# Patient Record
Sex: Female | Born: 1995 | Race: Black or African American | Hispanic: No | Marital: Single
Health system: Southern US, Community
[De-identification: ages and names within clinical notes are randomized; demographics above are authoritative.]

## PROBLEM LIST (undated history)

## (undated) ENCOUNTER — Inpatient Hospital Stay (HOSPITAL_COMMUNITY): Payer: Self-pay

## (undated) DIAGNOSIS — N200 Calculus of kidney: Secondary | ICD-10-CM

## (undated) DIAGNOSIS — N390 Urinary tract infection, site not specified: Secondary | ICD-10-CM

## (undated) DIAGNOSIS — J45909 Unspecified asthma, uncomplicated: Secondary | ICD-10-CM

## (undated) HISTORY — DX: Unspecified asthma, uncomplicated: J45.909

## (undated) HISTORY — PX: NO PAST SURGERIES: SHX2092

## (undated) HISTORY — DX: Calculus of kidney: N20.0

---
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
## 2014-02-10 ENCOUNTER — Encounter (HOSPITAL_COMMUNITY): Payer: Self-pay | Admitting: Emergency Medicine

## 2014-02-10 ENCOUNTER — Emergency Department (HOSPITAL_COMMUNITY)
Admission: EM | Admit: 2014-02-10 | Discharge: 2014-02-10 | Disposition: A | Discharge status: Home / Self Care | Payer: Medicaid Other | Attending: Emergency Medicine | Admitting: Emergency Medicine

## 2014-02-10 DIAGNOSIS — K068 Other specified disorders of gingiva and edentulous alveolar ridge: Secondary | ICD-10-CM

## 2014-02-10 DIAGNOSIS — K1379 Other lesions of oral mucosa: Secondary | ICD-10-CM

## 2014-02-10 DIAGNOSIS — K1370 Unspecified lesions of oral mucosa: Secondary | ICD-10-CM | POA: Insufficient documentation

## 2014-02-10 DIAGNOSIS — Z87440 Personal history of urinary (tract) infections: Secondary | ICD-10-CM | POA: Insufficient documentation

## 2014-02-10 DIAGNOSIS — Z792 Long term (current) use of antibiotics: Secondary | ICD-10-CM | POA: Diagnosis not present

## 2014-02-10 DIAGNOSIS — O9989 Other specified diseases and conditions complicating pregnancy, childbirth and the puerperium: Secondary | ICD-10-CM | POA: Diagnosis not present

## 2014-02-10 MED ORDER — ACETAMINOPHEN 325 MG PO TABS
650.0000 mg | ORAL_TABLET | Freq: Once | ORAL | Status: AC
  Administered 2014-02-10: 650 mg via ORAL
  Filled 2014-02-10: qty 2

## 2014-02-10 MED ORDER — LIDOCAINE VISCOUS 2 % MT SOLN: 20.0000 mL | Freq: Four times a day (QID) | OROMUCOSAL | Status: DC | PRN

## 2014-02-10 MED ORDER — LIDOCAINE VISCOUS 2 % MT SOLN: 15.0000 mL | Freq: Four times a day (QID) | OROMUCOSAL | Status: DC | PRN

## 2014-02-10 MED ORDER — LIDOCAINE VISCOUS 2 % MT SOLN
15.0000 mL | Freq: Once | OROMUCOSAL | Status: AC
  Administered 2014-02-10: 15 mL via OROMUCOSAL
  Filled 2014-02-10: qty 15

## 2014-02-10 NOTE — ED Notes (Signed)
The pt is c/o a toothache  Today

## 2014-02-10 NOTE — Discharge Instructions (Signed)
Read the information below.  Use the prescribed medication as directed.  Please discuss all new medications with your pharmacist.  You may return to the Emergency Department at any time for worsening condition or any new symptoms that concern you.  Please call the dentist listed above within 48 hours to schedule a close follow up appointment.  If you develop fevers, swelling in your face, difficulty swallowing or breathing, uncontrolled pain, return to the ER immediately for a recheck.     Gingivitis  Gingivitis is an infection of the teeth and bones that support the teeth. Your gums become red, sore, and puffy (swollen). It is caused by germs that build up on your teeth and gums (plaque). HOME CARE  Floss and then brush your teeth.  Brush at least twice a day.  Floss at least once a day.  Avoid sugar between meals.  Do not drink juice before bed. Only drink water.  Make and keep your regular checkups and cleanings with your dentist.  Use any mouth care product or toothpaste as told by your dentist. GET HELP RIGHT AWAY IF:  You have painful, red tissue around your teeth.  You have trouble chewing.  You have loose or infected teeth. MAKE SURE YOU:  Understand these instructions.  Will watch your condition.  Will get help right away if you are not doing well or get worse. Document Released: 06/19/2010 Document Revised: 08/09/2011 Document Reviewed: 08/21/2010 [facility] Patient Information 2015 [place], [place]. This information is not intended to replace advice given to you by your health care provider. Make sure you discuss any questions you have with your health care provider.

## 2014-02-10 NOTE — ED Provider Notes (Signed)
Medical screening examination/treatment/procedure(s) were performed by non-physician practitioner and as supervising physician I was immediately available for consultation/collaboration.   EKG Interpretation None        Dontrez Pettis, MD 02/10/14 2318 

## 2014-02-10 NOTE — ED Notes (Signed)
Pt 4 months preg

## 2014-02-10 NOTE — ED Provider Notes (Signed)
CSN: [id number]     Arrival date & time 02/10/14  2112 History   First MD Initiated Contact with Patient 02/10/14 2119     This chart was scribed for non-physician practitioner, Trixie Dredge PA-C working with Elwin Mocha, MD by Arlan Organ, ED Scribe. This patient was seen in room TR06C/TR06C and the patient's care was started at 9:20 PM.   Chief Complaint  Patient presents with  . Dental Problem   HPI HPI Comments: Karen Curry currently [redacted] weeks pregnant is a 18 y.o. female who presents to the Emergency Department complaining of constant, moderate R sided dental pain onset this evening after waking from sleep. Pain is currently radiating into the R ear and she describes discomfort as sore, states "it feels like my teeth are going to fall out"  The pain has been ongoing approximately one hour . She has not tried any OTC medications to help manage symptoms. She denies any fever, cough, abdominal pain, vaginal discharge, vaginal bleeding, urinary frequency, or dysuria. Pt states she experienced the same discomfort in July of this year. At that time, discomfort lasted for 1 day and resolved without any mediations or interventions. She is not currently followed by an OB/GYN but has a scheduled appointment with the health department on 9/21.  Is taking a daily prenatal vitamin.  Past Medical History  Diagnosis Date  . UTI (urinary tract infection)    No past surgical history on file. No family history on file. History  Substance Use Topics  . Smoking status: Never Smoker   . Smokeless tobacco: Not on file  . Alcohol Use: No   OB History   Grav Para Term Preterm Abortions TAB SAB Ect Mult Living                 Review of Systems  Constitutional: Negative for fever and chills.  HENT: Positive for dental problem. Negative for trouble swallowing.   Gastrointestinal: Negative for nausea, vomiting and abdominal pain.  All other systems reviewed and are negative.     Allergies  Review of  patient's allergies indicates no known allergies.  Home Medications   Prior to Admission medications   Medication Sig Start Date End Date Taking? Authorizing Provider  cephALEXin (KEFLEX) 500 MG capsule Take 1 capsule (500 mg total) by mouth 2 (two) times daily. 09/12/13   Jillyn Ledger, PA-C  metroNIDAZOLE (FLAGYL) 500 MG tablet Take 1 tablet (500 mg total) by mouth 2 (two) times daily. 09/12/13   Jillyn Ledger, PA-C   Triage Vitals: BP 128/76  Pulse 94  Temp(Src) 99.1 F (37.3 C) (Oral)  Resp 24  Wt 194 lb (87.998 kg)  SpO2 99%   Physical Exam  Nursing note and vitals reviewed. Constitutional: She appears well-developed and well-nourished. No distress.  HENT:  Head: Normocephalic and atraumatic.  Right Ear: Tympanic membrane and ear canal normal.  Left Ear: Tympanic membrane and ear canal normal.  Mouth/Throat: Uvula is midline, oropharynx is clear and moist and mucous membranes are normal. No oral lesions. No trismus in the jaw. Normal dentition. No dental abscesses, uvula swelling or dental caries.  Right upper and lower molars are all normal in appearance, no obvious caries, no fractures.  All of these teeth are tender to percussion.  The gingiva is normal in appearance but is tender to palpation throughout the upper and lower right side.  Tongue and oropharynx are normal.  No facial swelling.    Eyes: Conjunctivae are normal.  Neck:  Normal range of motion. Neck supple.  Cardiovascular: Normal rate and regular rhythm.   Pulmonary/Chest: Effort normal and breath sounds normal. No respiratory distress. She has no wheezes. She has no rales.  Abdominal: Soft. She exhibits no distension. There is no tenderness. There is no rebound and no guarding.  Neurological: She is alert.  Skin: She is not diaphoretic.  Psychiatric: She has a normal mood and affect. Her behavior is normal.    ED Course  Procedures (including critical care time)  DIAGNOSTIC STUDIES: Oxygen Saturation is  99% on RA, Normal by my interpretation.    COORDINATION OF CARE: 9:43 PM-Discussed treatment plan with pt at bedside and pt agreed to plan.     Labs Review Labs Reviewed - No data to display  Imaging Review No results found.   EKG Interpretation None       I have discussed with patient the safety of medications in pregnancy, the fact that our knowledge of drug safety in pregnancy is limited and that I can only make recommendations based on what is currently known at this time and the safety ratings given to medications.  We discussed that medications may pass through the placenta and have encouraged them to make their own decisions regarding the medications used in light of this information.    MDM   Final diagnoses:  Pain of gingiva  Oral pain    Pregnant patient with right upper and lower gingival pain, "dental pain" but dentition is normal in appearance.  What she actually describes is feeling as if the teeth with "fall out"  - I suspect this is a pregnancy-related gingival sensitivity.  The pain has only been present one hour.  It is present diffusely including all of her upper and lower teeth on the right side.  I doubt there is an injury, caries, or dental abscess affecting all of these teeth occuring all at one time.  Pt given tylenol and viscous lidocaine (category B) in ED, d/c home with viscous lidocaine instructions to swish and spit, dental follow up.  Pt also has OB f/u this month.  Has not yet had prenatal care but is taking a daily prenatal vitamin and is having no concerning symptoms including abdominal pain, vaginal bleeding, or discharge.  Discussed findings, treatment, and follow up  with patient.  Pt given return precautions.  Pt verbalizes understanding and agrees with plan.      I personally performed the services described in this documentation, which was scribed in my presence. The recorded information has been reviewed and is accurate.    Trixie Dredge,  PA-C 02/10/14 2201

## 2014-02-21 ENCOUNTER — Other Ambulatory Visit (HOSPITAL_COMMUNITY): Payer: Self-pay | Admitting: Nurse Practitioner

## 2014-02-21 DIAGNOSIS — Z3689 Encounter for other specified antenatal screening: Secondary | ICD-10-CM

## 2014-02-21 LAB — OB RESULTS CONSOLE HIV ANTIBODY (ROUTINE TESTING): HIV: NONREACTIVE

## 2014-02-21 LAB — OB RESULTS CONSOLE GC/CHLAMYDIA
Chlamydia: NEGATIVE
Gonorrhea: NEGATIVE

## 2014-02-21 LAB — OB RESULTS CONSOLE HEPATITIS B SURFACE ANTIGEN: Hepatitis B Surface Ag: NEGATIVE

## 2014-02-21 LAB — OB RESULTS CONSOLE RUBELLA ANTIBODY, IGM: Rubella: IMMUNE

## 2014-02-21 LAB — OB RESULTS CONSOLE ABO/RH: RH Type: POSITIVE

## 2014-02-21 LAB — OB RESULTS CONSOLE ANTIBODY SCREEN: Antibody Screen: NEGATIVE

## 2014-02-21 LAB — OB RESULTS CONSOLE RPR: RPR: NONREACTIVE

## 2014-03-07 ENCOUNTER — Encounter (HOSPITAL_COMMUNITY): Payer: Self-pay

## 2014-03-07 ENCOUNTER — Ambulatory Visit (HOSPITAL_COMMUNITY)
Admission: RE | Admit: 2014-03-07 | Discharge: 2014-03-07 | Disposition: A | Discharge status: Home / Self Care | Payer: Medicaid Other | Source: Ambulatory Visit | Attending: Nurse Practitioner | Admitting: Nurse Practitioner

## 2014-03-07 DIAGNOSIS — Z3A19 19 weeks gestation of pregnancy: Secondary | ICD-10-CM | POA: Diagnosis not present

## 2014-03-07 DIAGNOSIS — Z3689 Encounter for other specified antenatal screening: Secondary | ICD-10-CM

## 2014-03-07 DIAGNOSIS — Z36 Encounter for antenatal screening of mother: Secondary | ICD-10-CM | POA: Insufficient documentation

## 2014-03-28 ENCOUNTER — Inpatient Hospital Stay (HOSPITAL_COMMUNITY)
Admission: AD | Admit: 2014-03-28 | Discharge: 2014-03-28 | Disposition: A | Discharge status: Home / Self Care | Payer: Medicaid Other | Source: Ambulatory Visit | Attending: Obstetrics & Gynecology | Admitting: Obstetrics & Gynecology

## 2014-03-28 ENCOUNTER — Encounter (HOSPITAL_COMMUNITY): Payer: Self-pay | Admitting: *Deleted

## 2014-03-28 DIAGNOSIS — R102 Pelvic and perineal pain: Secondary | ICD-10-CM

## 2014-03-28 DIAGNOSIS — R109 Unspecified abdominal pain: Secondary | ICD-10-CM | POA: Insufficient documentation

## 2014-03-28 DIAGNOSIS — R1110 Vomiting, unspecified: Secondary | ICD-10-CM | POA: Insufficient documentation

## 2014-03-28 DIAGNOSIS — O26892 Other specified pregnancy related conditions, second trimester: Secondary | ICD-10-CM

## 2014-03-28 DIAGNOSIS — M545 Low back pain: Secondary | ICD-10-CM | POA: Diagnosis not present

## 2014-03-28 DIAGNOSIS — Z3A22 22 weeks gestation of pregnancy: Secondary | ICD-10-CM

## 2014-03-28 LAB — URINALYSIS, ROUTINE W REFLEX MICROSCOPIC
BILIRUBIN URINE: NEGATIVE
Glucose, UA: NEGATIVE mg/dL
Hgb urine dipstick: NEGATIVE
KETONES UR: 15 mg/dL — AB
NITRITE: NEGATIVE
PROTEIN: NEGATIVE mg/dL
Specific Gravity, Urine: 1.02 (ref 1.005–1.030)
UROBILINOGEN UA: 0.2 mg/dL (ref 0.0–1.0)
pH: 7 (ref 5.0–8.0)

## 2014-03-28 LAB — WET PREP, GENITAL
Clue Cells Wet Prep HPF POC: NONE SEEN
Trich, Wet Prep: NONE SEEN
Yeast Wet Prep HPF POC: NONE SEEN

## 2014-03-28 LAB — URINE MICROSCOPIC-ADD ON

## 2014-03-28 MED ORDER — CYCLOBENZAPRINE HCL 10 MG PO TABS
10.0000 mg | ORAL_TABLET | Freq: Three times a day (TID) | ORAL | Status: DC | PRN
Start: 2014-03-28 — End: 2015-06-16

## 2014-03-28 MED ORDER — TRAMADOL HCL 50 MG PO TABS: 50.0000 mg | ORAL_TABLET | Freq: Once | ORAL | Status: DC

## 2014-03-28 MED ORDER — IBUPROFEN 600 MG PO TABS: 600.0000 mg | ORAL_TABLET | Freq: Four times a day (QID) | ORAL | Status: DC | PRN

## 2014-03-28 NOTE — Discharge Instructions (Signed)

## 2014-03-28 NOTE — MAU Note (Signed)
Pt reports intermittent lower left abdominal pain and one occurrence of emesis.  States she had a lot of white discharge yesterday.  Denies VB/LOF.

## 2014-03-28 NOTE — MAU Provider Note (Signed)
History     CSN: [id number]  Arrival date and time: 03/28/14 [id number]   First Provider Initiated Contact with Patient 03/28/14 1910      Chief Complaint  Patient presents with  . Abdominal Pain  . Emesis   Abdominal Pain Associated symptoms include vomiting.  Emesis  Associated symptoms include abdominal pain.   Karen Curry is a 18 y.o. G1P0 at 7654w4d who presents with sharp, intermittent, severe left lower back pain that radiates into her left groin for the past day. Nothing makes the pain better or worse. Has not tried any medications. Pain is somewhat improved currently.   Additionally reports increased thick, white, non-foul smelling vaginal discharge for the past day. One episode of emesis en route to the MAU via EMS. No dysuria. No diarrhea. No constipation. Has been passing gas. No recent sick contacts.   Reports prior history of kidney stones and "kidney infections." Says that this pain is not similar.   No LOF or vaginal bleeding. No contractions. Patient is unsure if she has felt her baby move yet.    OB History   Grav Para Term Preterm Abortions TAB SAB Ect Mult Living   1               Past Medical History  Diagnosis Date  . UTI (urinary tract infection)     History reviewed. No pertinent past surgical history.  History reviewed. No pertinent family history.  History  Substance Use Topics  . Smoking status: Never Smoker   . Smokeless tobacco: Not on file  . Alcohol Use: No    Allergies: No Known Allergies  Prescriptions prior to admission  Medication Sig Dispense Refill  . acetaminophen (TYLENOL) 325 MG tablet Take 650 mg by mouth every 6 (six) hours as needed for moderate pain (Used for tooth problems.).      Marland Kitchen. lidocaine (XYLOCAINE) 2 % solution Use as directed 15 mLs in the mouth or throat every 6 (six) hours as needed for mouth pain. Swish and SPIT PRN mouth pain  150 mL  0    Review of Systems  Gastrointestinal: Positive for vomiting and  abdominal pain.  All other systems reviewed and are negative.  Physical Exam   Blood pressure 127/75, pulse 92, temperature 99.1 F (37.3 C), temperature source Oral, resp. rate 18, last menstrual period 08/30/2013.  Physical Exam  Nursing note and vitals reviewed. Constitutional: She is oriented to person, place, and time. She appears well-developed and well-nourished. No distress.  HENT:  Head: Normocephalic and atraumatic.  Eyes: EOM are normal.  Neck: Normal range of motion.  Cardiovascular: Normal rate, regular rhythm and normal heart sounds.   No murmur heard. Respiratory: Effort normal and breath sounds normal. No respiratory distress. She has no wheezes.  GI: Soft. She exhibits no distension. There is tenderness (Mildly tender to palpation in left inguinal fold).  Musculoskeletal: Normal range of motion. She exhibits tenderness (Mild left sided CVA tenderness.).       Lumbar back: She exhibits no bony tenderness, no swelling and no deformity.  Neurological: She is alert and oriented to person, place, and time. She has normal reflexes.  Skin: Skin is warm and dry.  SSE: Moderate amount of thick, white discharge noted in vaginal vault SVE: Cervix closed/thick/high, no CMT FHT:  FHR: 140 bpm, variability: avg,  accelerations:  +,  decelerations:  none  Procedures-None   Results for orders placed during the hospital encounter of 03/28/14 (from the past 24 hour(s))  URINALYSIS, ROUTINE W REFLEX MICROSCOPIC     Status: Abnormal   Collection Time    03/28/14  6:30 PM      Result Value Ref Range   Color, Urine YELLOW  YELLOW   APPearance CLEAR  CLEAR   Specific Gravity, Urine 1.020  1.005 - 1.030   pH 7.0  5.0 - 8.0   Glucose, UA NEGATIVE  NEGATIVE mg/dL   Hgb urine dipstick NEGATIVE  NEGATIVE   Bilirubin Urine NEGATIVE  NEGATIVE   Ketones, ur 15 (*) NEGATIVE mg/dL   Protein, ur NEGATIVE  NEGATIVE mg/dL   Urobilinogen, UA 0.2  0.0 - 1.0 mg/dL   Nitrite NEGATIVE  NEGATIVE    Leukocytes, UA SMALL (*) NEGATIVE  URINE MICROSCOPIC-ADD ON     Status: Abnormal   Collection Time    03/28/14  6:30 PM      Result Value Ref Range   Squamous Epithelial / LPF FEW (*) RARE   WBC, UA 3-6  <3 WBC/hpf   RBC / HPF 0-2  <3 RBC/hpf   Bacteria, UA MANY (*) RARE   Crystals CA OXALATE CRYSTALS (*) NEGATIVE   Urine-Other AMORPHOUS URATES/PHOSPHATES    WET PREP, GENITAL     Status: Abnormal   Collection Time    03/28/14  7:30 PM      Result Value Ref Range   Yeast Wet Prep HPF POC NONE SEEN  NONE SEEN   Trich, Wet Prep NONE SEEN  NONE SEEN   Clue Cells Wet Prep HPF POC NONE SEEN  NONE SEEN   WBC, Wet Prep HPF POC FEW (*) NONE SEEN      Assessment and Plan  A: 433w4d IUP. MOst likely musculoskeletal pain.  No evidence of infection/PTL/kidney stone. GIven rx for flexeril and ibuprofen to take this week prn  P: Discharge home in stable condition. Labor precautions and kickcounts reviewed.   Curry,Karen Porchia 03/28/2014, 8:37 PM

## 2014-03-28 NOTE — MAU Note (Signed)
Urine in lab 

## 2014-03-29 LAB — GC/CHLAMYDIA PROBE AMP
CT Probe RNA: NEGATIVE
GC PROBE AMP APTIMA: NEGATIVE

## 2014-03-29 NOTE — MAU Provider Note (Signed)
Attestation of Attending Supervision of Advanced Practitioner (CNM/NP): Evaluation and management procedures were performed by the Advanced Practitioner under my supervision and collaboration.  I have reviewed the Advanced Practitioner's note and chart, and I agree with the management and plan.  HARRAWAY-SMITH, Laporsche Hoeger 9:12 AM     

## 2014-04-01 ENCOUNTER — Encounter (HOSPITAL_COMMUNITY): Payer: Self-pay | Admitting: *Deleted

## 2014-04-25 ENCOUNTER — Inpatient Hospital Stay (HOSPITAL_COMMUNITY)
Admission: AD | Admit: 2014-04-25 | Discharge: 2014-04-25 | Disposition: A | Discharge status: Home / Self Care | Payer: Self-pay | Source: Ambulatory Visit | Attending: Obstetrics & Gynecology | Admitting: Obstetrics & Gynecology

## 2014-04-25 ENCOUNTER — Encounter (HOSPITAL_COMMUNITY): Payer: Self-pay | Admitting: *Deleted

## 2014-04-25 DIAGNOSIS — Z3A26 26 weeks gestation of pregnancy: Secondary | ICD-10-CM | POA: Insufficient documentation

## 2014-04-25 DIAGNOSIS — O9989 Other specified diseases and conditions complicating pregnancy, childbirth and the puerperium: Secondary | ICD-10-CM

## 2014-04-25 DIAGNOSIS — R102 Pelvic and perineal pain: Secondary | ICD-10-CM | POA: Insufficient documentation

## 2014-04-25 DIAGNOSIS — O26892 Other specified pregnancy related conditions, second trimester: Secondary | ICD-10-CM | POA: Insufficient documentation

## 2014-04-25 DIAGNOSIS — O26899 Other specified pregnancy related conditions, unspecified trimester: Secondary | ICD-10-CM

## 2014-04-25 DIAGNOSIS — R109 Unspecified abdominal pain: Secondary | ICD-10-CM | POA: Insufficient documentation

## 2014-04-25 LAB — URINALYSIS, ROUTINE W REFLEX MICROSCOPIC
BILIRUBIN URINE: NEGATIVE
GLUCOSE, UA: NEGATIVE mg/dL
Hgb urine dipstick: NEGATIVE
KETONES UR: NEGATIVE mg/dL
Leukocytes, UA: NEGATIVE
Nitrite: NEGATIVE
PROTEIN: 100 mg/dL — AB
Specific Gravity, Urine: 1.025 (ref 1.005–1.030)
UROBILINOGEN UA: 0.2 mg/dL (ref 0.0–1.0)
pH: 6 (ref 5.0–8.0)

## 2014-04-25 LAB — URINE MICROSCOPIC-ADD ON

## 2014-04-25 LAB — FETAL FIBRONECTIN: FETAL FIBRONECTIN: NEGATIVE

## 2014-04-25 NOTE — Discharge Instructions (Signed)

## 2014-04-25 NOTE — MAU Provider Note (Signed)
First Provider Initiated Contact with Patient 04/25/14 2223      Chief Complaint:  Abdominal Pain   Karen Curry is  18 y.o. G1P0 at 4981w4d presents complaining of Abdominal Pain It is in the suprapubic area, also in LLQ; it comes and goes and lasts about 15 seconds.   Also c/o lower back pain, consistent with the musculoskeletal pain she has been having for over a month.  Has a rx for flexeril, which helps, just hasn't taken any today. Admits to walking around a lot today, including climbing stairs a lot.  No bleeding/dysuria/vaginal irritation/itching   Of note, upon first entering the room, pt had 4 visitors with her.  She was uncommunicative with her eyes closed, moaning, refusing to open her eyes and answer questions.  When I cleared the room of visitors, pt's demeanor changed completely: smiling and laughing, appropriate interaction.  Obstetrical/Gynecological History: OB History    Gravida Para Term Preterm AB TAB SAB Ectopic Multiple Living   1              Past Medical History: Past Medical History  Diagnosis Date  . UTI (urinary tract infection)     Past Surgical History: Past Surgical History  Procedure Laterality Date  . No past surgeries      Family History: Family History  Problem Relation Age of Onset  . Hypertension Father     Social History: History  Substance Use Topics  . Smoking status: Never Smoker   . Smokeless tobacco: Not on file  . Alcohol Use: No    Allergies: No Known Allergies  Meds:  Prescriptions prior to admission  Medication Sig Dispense Refill Last Dose  . acetaminophen (TYLENOL) 325 MG tablet Take 650 mg by mouth every 6 (six) hours as needed for moderate pain (Used for tooth problems.).   Past Week at Unknown time  . cyclobenzaprine (FLEXERIL) 10 MG tablet Take 1 tablet (10 mg total) by mouth every 8 (eight) hours as needed for muscle spasms. 30 tablet 1   . ibuprofen (ADVIL,MOTRIN) 600 MG tablet Take 1 tablet (600 mg total) by  mouth every 6 (six) hours as needed. 20 tablet 0     Review of Systems   Constitutional: Negative for fever and chills Eyes: Negative for visual disturbances Respiratory: Negative for shortness of breath, dyspnea Cardiovascular: Negative for chest pain or palpitations  Gastrointestinal: Negative for vomiting, diarrhea and constipation.  Had normal BM yesterday Genitourinary: Negative for dysuria and urgency Musculoskeletal: Negative for joint pain, myalgias  Neurological: Negative for dizziness and headaches     Physical Exam  Last menstrual period 08/30/2013.  123/70 GENERAL: Well-developed, well-nourished female in no acute distress.  LUNGS: Clear to auscultation bilaterally.  HEART: Regular rate and rhythm. ABDOMEN: Soft, nontender, nondistended, gravid. No guarding or rebound tenderness.  Soft when she has a pain EXTREMITIES: Nontender, no edema, 2+ distal pulses. DTR's 2+ CERVICAL EXAM: Dilatation 0cm   Effacement 0%   Station out of pelvis.  Fetal fibronectin collected. FHT:  Baseline rate 140 bpm   Variability moderate  Accelerations present (10X10)   Decelerations none Contractions: Every 0 mins   Labs: Results for orders placed or performed during the hospital encounter of 04/25/14 (from the past 24 hour(s))  Fetal fibronectin   Collection Time: 04/25/14 10:15 PM  Result Value Ref Range   Fetal Fibronectin NEGATIVE NEGATIVE  Urinalysis, Routine w reflex microscopic   Collection Time: 04/25/14 10:30 PM  Result Value Ref Range   Color,  Urine YELLOW YELLOW   APPearance CLEAR CLEAR   Specific Gravity, Urine 1.025 1.005 - 1.030   pH 6.0 5.0 - 8.0   Glucose, UA NEGATIVE NEGATIVE mg/dL   Hgb urine dipstick NEGATIVE NEGATIVE   Bilirubin Urine NEGATIVE NEGATIVE   Ketones, ur NEGATIVE NEGATIVE mg/dL   Protein, ur 161100 (A) NEGATIVE mg/dL   Urobilinogen, UA 0.2 0.0 - 1.0 mg/dL   Nitrite NEGATIVE NEGATIVE   Leukocytes, UA NEGATIVE NEGATIVE  Urine microscopic-add on    Collection Time: 04/25/14 10:30 PM  Result Value Ref Range   Squamous Epithelial / LPF FEW (A) RARE   WBC, UA 0-2 <3 WBC/hpf   RBC / HPF 0-2 <3 RBC/hpf   Bacteria, UA RARE RARE   Urine-Other MUCOUS PRESENT    Imaging Studies:  No results found.  Assessment: Karen Curry is  18 y.o. G1P0 at 4152w4d presents with musculoskeletal back pain, round ligament pain. Possible intestinal wall spasm. Pain is less often now and not as intense.    Plan: Culture urine (+ protein) Continue Flexeril prn F/U if new sx develop  CRESENZO-DISHMAN,Milanya Sunderland 11/26/201511:47 PM  '

## 2014-04-25 NOTE — MAU Note (Signed)
C/o lower back since yesterday afternoon; c/o lower abdominal pain that started about a hour ago; more pain on LLQ than LRQ;

## 2014-04-26 LAB — URINE CULTURE: Special Requests: NORMAL

## 2014-05-31 NOTE — L&D Delivery Note (Signed)
Delivery Note At 8:55 PM a viable and healthy female was delivered via Vaginal, Spontaneous Delivery (Presentation: Left Occiput Anterior).  APGAR: 8, 9; weight 5 lb 8.2 oz (2500 g).   Placenta status: Intact, Spontaneous.  Cord: 3 vessels with the following complications: None.    Anesthesia: Epidural Local  Episiotomy: None Lacerations: 2nd degree;Perineal Suture Repair: 3.0 monocryl Est. Blood Loss (mL): 250  Mom to postpartum.  Baby to Couplet care / Skin to Skin.  Karen Curry is a 19 y.o. female G1P0 with IUP at 8760w1d by 8week US presenting for active labor after cervical change noted in MAU. She progressed with some augmentation with Pitocin to complete and pushed less than 1 hour to deliver.  Cord clamping delayed by several minutes then clamped by CNM and cut by family member.  Placenta intact and spontaneous, bleeding minimal.  Very small second degree perineal laceration repaired without difficulty.  Mom and baby stable prior to transfer to postpartum. She plans on breast and bottle feeding. She request Depo Provera for birth control.  LEFTWICH-KIRBY, Caz Weaver 08/05/2014, 10:38 PM

## 2014-07-02 LAB — OB RESULTS CONSOLE GC/CHLAMYDIA
Chlamydia: NEGATIVE
Gonorrhea: NEGATIVE

## 2014-07-02 LAB — OB RESULTS CONSOLE GBS: STREP GROUP B AG: POSITIVE

## 2014-07-30 ENCOUNTER — Other Ambulatory Visit (HOSPITAL_COMMUNITY): Payer: Self-pay | Admitting: Nurse Practitioner

## 2014-07-30 DIAGNOSIS — O480 Post-term pregnancy: Secondary | ICD-10-CM

## 2014-08-02 ENCOUNTER — Telehealth (HOSPITAL_COMMUNITY): Payer: Self-pay | Admitting: *Deleted

## 2014-08-02 ENCOUNTER — Encounter (HOSPITAL_COMMUNITY): Payer: Self-pay | Admitting: *Deleted

## 2014-08-02 ENCOUNTER — Ambulatory Visit (HOSPITAL_COMMUNITY)
Admission: RE | Admit: 2014-08-02 | Discharge: 2014-08-02 | Disposition: A | Discharge status: Home / Self Care | Payer: Medicaid Other | Source: Ambulatory Visit | Attending: Nurse Practitioner | Admitting: Nurse Practitioner

## 2014-08-02 DIAGNOSIS — Z3A40 40 weeks gestation of pregnancy: Secondary | ICD-10-CM | POA: Insufficient documentation

## 2014-08-02 DIAGNOSIS — O480 Post-term pregnancy: Secondary | ICD-10-CM | POA: Diagnosis not present

## 2014-08-02 NOTE — Telephone Encounter (Signed)
Preadmission screen  

## 2014-08-03 ENCOUNTER — Inpatient Hospital Stay (HOSPITAL_COMMUNITY)
Admission: AD | Admit: 2014-08-03 | Discharge: 2014-08-03 | Disposition: A | Discharge status: Home / Self Care | Payer: Medicaid Other | Source: Ambulatory Visit | Attending: Family Medicine | Admitting: Family Medicine

## 2014-08-03 ENCOUNTER — Encounter (HOSPITAL_COMMUNITY): Payer: Self-pay | Admitting: *Deleted

## 2014-08-03 DIAGNOSIS — O9989 Other specified diseases and conditions complicating pregnancy, childbirth and the puerperium: Secondary | ICD-10-CM | POA: Insufficient documentation

## 2014-08-03 DIAGNOSIS — O471 False labor at or after 37 completed weeks of gestation: Secondary | ICD-10-CM

## 2014-08-03 DIAGNOSIS — Z3A40 40 weeks gestation of pregnancy: Secondary | ICD-10-CM | POA: Insufficient documentation

## 2014-08-03 LAB — AMNISURE RUPTURE OF MEMBRANE (ROM) NOT AT ARMC: Amnisure ROM: NEGATIVE

## 2014-08-03 MED ORDER — NALBUPHINE HCL 10 MG/ML IJ SOLN: 10.0000 mg | Freq: Once | INTRAMUSCULAR | Status: DC

## 2014-08-03 MED ORDER — NALBUPHINE HCL 10 MG/ML IJ SOLN
10.0000 mg | Freq: Once | INTRAMUSCULAR | Status: AC
  Administered 2014-08-03: 10 mg via INTRAMUSCULAR
  Filled 2014-08-03: qty 1

## 2014-08-03 MED ORDER — PROMETHAZINE HCL 25 MG/ML IJ SOLN
12.5000 mg | Freq: Four times a day (QID) | INTRAMUSCULAR | Status: DC | PRN
Start: 2014-08-03 — End: 2014-08-03

## 2014-08-03 NOTE — MAU Provider Note (Signed)
History     CSN: [id number]  Arrival date and time: 08/03/14 1551   None     No chief complaint on file.  HPI  Patient is 19 y.o. G1P0 544w6d, health dept patient, here with complaints of water breaking.  She reports loss of mucous with pink streaking she believed to be mucous plug at 0700 this AM, with sensation of water breaking (with no blood in fluid).  She felt contractions worsening since that time.  She was sent over via EMS by health department.  Reports sensation of contractions wrapping around abdomen lasting for minutes, occuring every 3 minutes and that they are extremely painful.  She is talking comfortably with family members on phone while expressing about pain. She reports increased fetal activity. No problems with blood pressure or DM during the pregnancy.    Past Medical History  Diagnosis Date  . UTI (urinary tract infection)   . Asthma     Past Surgical History  Procedure Laterality Date  . No past surgeries      Family History  Problem Relation Age of Onset  . Hypertension Father     History  Substance Use Topics  . Smoking status: Never Smoker   . Smokeless tobacco: Not on file  . Alcohol Use: No    Allergies: No Known Allergies  Prescriptions prior to admission  Medication Sig Dispense Refill Last Dose  . acetaminophen (TYLENOL) 325 MG tablet Take 650 mg by mouth every 6 (six) hours as needed for moderate pain (Used for tooth problems.).   Past Month at Unknown time  . Prenatal Vit-Fe Fumarate-FA (PRENATAL MULTIVITAMIN) TABS tablet Take 1 tablet by mouth daily at 12 noon.   Past Week at Unknown time  . cyclobenzaprine (FLEXERIL) 10 MG tablet Take 1 tablet (10 mg total) by mouth every 8 (eight) hours as needed for muscle spasms. (Patient not taking: Reported on 08/03/2014) 30 tablet 1   . ibuprofen (ADVIL,MOTRIN) 600 MG tablet Take 1 tablet (600 mg total) by mouth every 6 (six) hours as needed. (Patient not taking: Reported on 08/03/2014) 20 tablet 0      Review of Systems  Constitutional: Negative for fever and chills.  Eyes: Negative for blurred vision.  Respiratory: Negative for cough and shortness of breath.   Cardiovascular: Negative for leg swelling.  Gastrointestinal: Positive for abdominal pain. Negative for nausea, vomiting and diarrhea.  Genitourinary: Negative for dysuria and hematuria.  Neurological: Negative for headaches.   Physical Exam   Blood pressure 148/88, pulse 81, temperature 98 F (36.7 C), temperature source Oral, resp. rate 20, last menstrual period 08/30/2013.  Physical Exam  Constitutional: She appears well-developed and well-nourished. She appears distressed.  HENT:  Head: Normocephalic and atraumatic.  Eyes: Conjunctivae are normal. Pupils are equal, round, and reactive to light.  Neck: Normal range of motion.  Cardiovascular: Normal rate.   Respiratory: Effort normal. No respiratory distress.  GI: Soft. She exhibits no distension. There is no tenderness.  Musculoskeletal: Normal range of motion. She exhibits no edema.  Neurological: She is alert. Coordination normal.  Skin: Skin is warm and dry. She is not diaphoretic.  Cervical exam 1/50/posterior/firm.  On repeat exam, found to be 1/50/posterior/medium  MAU Course  Procedures  MDM Ultrasound-confirmed vertex presentation  Assessment and Plan  A: Patient is 19 y.o. G1P0 394w6d here with complaints of water breaking   P: Cervical exam reassuring; no visible pooling     Ferning neg by microscopy, UA neg  Amnisure neg      Recevied nubain and phenergan prior to dishcarge      Discussed early labor; labor precautions with patient           Curry,Karen 08/03/2014, 5:12 PM   OB fellow attestation:  I have seen and examined this patient; I agree with above documentation in the resident's note.   Karen Curry is a 19 y.o. G1P0 reporting loss of fluid, regular contractions, very uncomfortable brought in by EMS. +FM, denies VB, vaginal  discharge.  PE: BP 116/62 mmHg  Pulse 66  Temp(Src) 98 F (36.7 C) (Oral)  Resp 18  LMP 08/30/2013 Gen: very uncomfortable with contractions Resp: normal effort, no distress Abd: gravid Dilation: 1 Effacement (%): 50 Cervical Position: Posterior Station: -3 Presentation: Vertex Exam by:: Karen Reamer, RN   ROS, labs, PMH reviewed NST reactive  Plan: - fetal kick counts reinforced,  labor precautions - continue routine follow up in OB clinic - given  nubain IM,  nubain IV, 12.5mg  phenergan for pain/therapeutic rest after minimal cervical change in MAU - fern neg, amnisure neg => d/c home  Karen Gage ROCIO, MD 2:28 AM

## 2014-08-03 NOTE — MAU Note (Signed)
Mucus plug came out at 0700 and stated she felt like her water may have broken 1500.  Contractions have gotten worse since.  Denies VB.

## 2014-08-04 ENCOUNTER — Encounter (HOSPITAL_COMMUNITY): Payer: Self-pay | Admitting: *Deleted

## 2014-08-04 ENCOUNTER — Inpatient Hospital Stay (HOSPITAL_COMMUNITY)
Admission: AD | Admit: 2014-08-04 | Discharge: 2014-08-07 | DRG: 775 | Disposition: A | Discharge status: Home / Self Care | Payer: Medicaid Other | Source: Ambulatory Visit | Attending: Obstetrics & Gynecology | Admitting: Obstetrics & Gynecology

## 2014-08-04 DIAGNOSIS — Z3A41 41 weeks gestation of pregnancy: Secondary | ICD-10-CM | POA: Diagnosis present

## 2014-08-04 DIAGNOSIS — J45909 Unspecified asthma, uncomplicated: Secondary | ICD-10-CM | POA: Diagnosis present

## 2014-08-04 DIAGNOSIS — O99824 Streptococcus B carrier state complicating childbirth: Secondary | ICD-10-CM | POA: Diagnosis present

## 2014-08-04 DIAGNOSIS — IMO0001 Reserved for inherently not codable concepts without codable children: Secondary | ICD-10-CM

## 2014-08-04 DIAGNOSIS — Z23 Encounter for immunization: Secondary | ICD-10-CM

## 2014-08-04 DIAGNOSIS — O9952 Diseases of the respiratory system complicating childbirth: Secondary | ICD-10-CM | POA: Diagnosis present

## 2014-08-04 DIAGNOSIS — O480 Post-term pregnancy: Secondary | ICD-10-CM | POA: Diagnosis present

## 2014-08-05 ENCOUNTER — Inpatient Hospital Stay (HOSPITAL_COMMUNITY): Payer: Medicaid Other | Admitting: Anesthesiology

## 2014-08-05 ENCOUNTER — Encounter (HOSPITAL_COMMUNITY): Payer: Self-pay | Admitting: General Practice

## 2014-08-05 DIAGNOSIS — O99824 Streptococcus B carrier state complicating childbirth: Secondary | ICD-10-CM

## 2014-08-05 DIAGNOSIS — Z23 Encounter for immunization: Secondary | ICD-10-CM | POA: Diagnosis not present

## 2014-08-05 DIAGNOSIS — IMO0001 Reserved for inherently not codable concepts without codable children: Secondary | ICD-10-CM

## 2014-08-05 DIAGNOSIS — J45909 Unspecified asthma, uncomplicated: Secondary | ICD-10-CM | POA: Diagnosis present

## 2014-08-05 DIAGNOSIS — Z3A41 41 weeks gestation of pregnancy: Secondary | ICD-10-CM | POA: Diagnosis present

## 2014-08-05 DIAGNOSIS — O480 Post-term pregnancy: Secondary | ICD-10-CM | POA: Diagnosis present

## 2014-08-05 DIAGNOSIS — O9952 Diseases of the respiratory system complicating childbirth: Secondary | ICD-10-CM | POA: Diagnosis present

## 2014-08-05 LAB — TYPE AND SCREEN
ABO/RH(D): AB POS
Antibody Screen: NEGATIVE

## 2014-08-05 LAB — RPR: RPR Ser Ql: NONREACTIVE

## 2014-08-05 LAB — CBC
HEMATOCRIT: 37.7 % (ref 36.0–46.0)
HEMOGLOBIN: 12.7 g/dL (ref 12.0–15.0)
MCH: 27.7 pg (ref 26.0–34.0)
MCHC: 33.7 g/dL (ref 30.0–36.0)
MCV: 82.1 fL (ref 78.0–100.0)
Platelets: 244 10*3/uL (ref 150–400)
RBC: 4.59 MIL/uL (ref 3.87–5.11)
RDW: 14.6 % (ref 11.5–15.5)
WBC: 7.9 10*3/uL (ref 4.0–10.5)

## 2014-08-05 LAB — ABO/RH: ABO/RH(D): AB POS

## 2014-08-05 MED ORDER — DIPHENHYDRAMINE HCL 50 MG/ML IJ SOLN: 12.5000 mg | INTRAMUSCULAR | Status: DC | PRN

## 2014-08-05 MED ORDER — OXYTOCIN 40 UNITS IN LACTATED RINGERS INFUSION - SIMPLE MED: 62.5000 mL/h | INTRAVENOUS | Status: DC

## 2014-08-05 MED ORDER — LACTATED RINGERS IV SOLN: 500.0000 mL | INTRAVENOUS | Status: DC | PRN

## 2014-08-05 MED ORDER — LIDOCAINE HCL (PF) 1 % IJ SOLN
INTRAMUSCULAR | Status: DC | PRN
  Administered 2014-08-05 (×2): 4 mL

## 2014-08-05 MED ORDER — FENTANYL 2.5 MCG/ML BUPIVACAINE 1/10 % EPIDURAL INFUSION (WH - ANES)
INTRAMUSCULAR | Status: DC | PRN
  Administered 2014-08-05: 14 mL/h via EPIDURAL

## 2014-08-05 MED ORDER — CITRIC ACID-SODIUM CITRATE 334-500 MG/5ML PO SOLN: 30.0000 mL | ORAL | Status: DC | PRN

## 2014-08-05 MED ORDER — LACTATED RINGERS IV SOLN
INTRAVENOUS | Status: DC
  Administered 2014-08-05 (×2): via INTRAUTERINE

## 2014-08-05 MED ORDER — OXYTOCIN BOLUS FROM INFUSION: 500.0000 mL | INTRAVENOUS | Status: DC

## 2014-08-05 MED ORDER — PHENYLEPHRINE 40 MCG/ML (10ML) SYRINGE FOR IV PUSH (FOR BLOOD PRESSURE SUPPORT)
80.0000 ug | PREFILLED_SYRINGE | INTRAVENOUS | Status: DC | PRN
  Filled 2014-08-05: qty 2

## 2014-08-05 MED ORDER — EPHEDRINE 5 MG/ML INJ
10.0000 mg | INTRAVENOUS | Status: DC | PRN
  Filled 2014-08-05: qty 2

## 2014-08-05 MED ORDER — LIDOCAINE HCL (PF) 1 % IJ SOLN
30.0000 mL | INTRAMUSCULAR | Status: DC | PRN
  Administered 2014-08-05: 30 mL via SUBCUTANEOUS
  Filled 2014-08-05: qty 30

## 2014-08-05 MED ORDER — ACETAMINOPHEN 325 MG PO TABS: 650.0000 mg | ORAL_TABLET | ORAL | Status: DC | PRN

## 2014-08-05 MED ORDER — PENICILLIN G POTASSIUM 5000000 UNITS IJ SOLR
2.5000 10*6.[IU] | INTRAVENOUS | Status: DC
  Administered 2014-08-05 (×4): 2.5 10*6.[IU] via INTRAVENOUS
  Filled 2014-08-05 (×9): qty 2.5

## 2014-08-05 MED ORDER — OXYCODONE-ACETAMINOPHEN 5-325 MG PO TABS: 1.0000 | ORAL_TABLET | ORAL | Status: DC | PRN

## 2014-08-05 MED ORDER — OXYTOCIN 40 UNITS IN LACTATED RINGERS INFUSION - SIMPLE MED
1.0000 m[IU]/min | INTRAVENOUS | Status: DC
  Administered 2014-08-05: 2 m[IU]/min via INTRAVENOUS
  Filled 2014-08-05: qty 1000

## 2014-08-05 MED ORDER — FENTANYL CITRATE 0.05 MG/ML IJ SOLN: 25.0000 ug | INTRAMUSCULAR | Status: DC | PRN

## 2014-08-05 MED ORDER — ONDANSETRON HCL 4 MG/2ML IJ SOLN: 4.0000 mg | Freq: Four times a day (QID) | INTRAMUSCULAR | Status: DC | PRN

## 2014-08-05 MED ORDER — IBUPROFEN 600 MG PO TABS
600.0000 mg | ORAL_TABLET | Freq: Four times a day (QID) | ORAL | Status: DC
  Administered 2014-08-05 – 2014-08-07 (×6): 600 mg via ORAL
  Filled 2014-08-05 (×6): qty 1

## 2014-08-05 MED ORDER — FENTANYL 2.5 MCG/ML BUPIVACAINE 1/10 % EPIDURAL INFUSION (WH - ANES)
14.0000 mL/h | INTRAMUSCULAR | Status: DC | PRN
  Administered 2014-08-05 (×3): 14 mL/h via EPIDURAL
  Filled 2014-08-05 (×3): qty 125

## 2014-08-05 MED ORDER — TERBUTALINE SULFATE 1 MG/ML IJ SOLN: 0.2500 mg | Freq: Once | INTRAMUSCULAR | Status: AC | PRN

## 2014-08-05 MED ORDER — PHENYLEPHRINE 40 MCG/ML (10ML) SYRINGE FOR IV PUSH (FOR BLOOD PRESSURE SUPPORT)
80.0000 ug | PREFILLED_SYRINGE | INTRAVENOUS | Status: DC | PRN
  Filled 2014-08-05: qty 20
  Filled 2014-08-05: qty 2

## 2014-08-05 MED ORDER — DEXTROSE 5 % IV SOLN
5.0000 10*6.[IU] | Freq: Once | INTRAVENOUS | Status: AC
  Administered 2014-08-05: 5 10*6.[IU] via INTRAVENOUS
  Filled 2014-08-05: qty 5

## 2014-08-05 MED ORDER — OXYCODONE-ACETAMINOPHEN 5-325 MG PO TABS: 2.0000 | ORAL_TABLET | ORAL | Status: DC | PRN

## 2014-08-05 MED ORDER — LACTATED RINGERS IV SOLN
500.0000 mL | Freq: Once | INTRAVENOUS | Status: AC
  Administered 2014-08-05: 500 mL via INTRAVENOUS

## 2014-08-05 MED ORDER — LACTATED RINGERS IV SOLN
INTRAVENOUS | Status: DC
  Administered 2014-08-05: 01:00:00 via INTRAVENOUS

## 2014-08-05 NOTE — Progress Notes (Signed)
LABOR PROGRESS NOTE  Karen Curry is a 19 y.o. G1P0 at 5855w1d  admitted for early active labor.   Subjective: Called to bedside to evaluate fetal decelerations.   Objective: BP 106/60 mmHg  Pulse 77  Temp(Src) 98.1 F (36.7 C) (Oral)  Resp 18  Ht 5\' 3"  (1.6 m)  Wt 227 lb (102.967 kg)  BMI 40.22 kg/m2  SpO2 97%  LMP 08/30/2013 or  Filed Vitals:   08/05/14 1230 08/05/14 1300 08/05/14 1330 08/05/14 1400  BP: 109/66 102/56 106/63 106/60  Pulse: 84 73 77 77  Temp:      TempSrc:      Resp:      Height:      Weight:      SpO2:           FHT:  FHR: 130 bpm, variability: moderate,  accelerations:  Present,  decelerations:  Present variable decelerations UC:   regular, every 2-3 minutes SVE:   Dilation: 5 Effacement (%): 90 Station: -1 Exam by:: Letricia Krinsky, md  Dilation: 5 Effacement (%): 90 Cervical Position: Anterior Station: -1 Presentation: Vertex Exam by:: Jahaira Earnhart, md  Pitocin @ 12 mu/min  Labs: Lab Results  Component Value Date   WBC 7.9 08/05/2014   HGB 12.7 08/05/2014   HCT 37.7 08/05/2014   MCV 82.1 08/05/2014   PLT 244 08/05/2014    Assessment / Plan: Augmentation of labor, progressing well. Continues to be in early active labor.   Labor: Progressing on Pitocin. Continue to increase per protocol. AROM at 1430 with clear fluid, IUPC placed.  Fetal Wellbeing:  Category II, Start amnioinfusion. Maternal repositioning and fluid bolus. Continue to monitor.  Pain Control:  Epidural  ID: GBS positive, PCN Anticipated MOD:  NSVD  William DaltonMcEachern, Veryl Winemiller, MD 08/05/2014, 2:30 PM

## 2014-08-05 NOTE — Progress Notes (Signed)
LABOR PROGRESS NOTE  Karen Curry is a 19 y.o. G1P0 at 5159w1d  admitted for early active labor.   Subjective: Sleeping comfortably with epidural. No concerns or complaints at this time.   Objective: BP 110/59 mmHg  Pulse 75  Temp(Src) 98.1 F (36.7 C) (Oral)  Resp 18  Ht 5\' 3"  (1.6 m)  Wt 227 lb (102.967 kg)  BMI 40.22 kg/m2  SpO2 97%  LMP 08/30/2013 or  Filed Vitals:   08/05/14 1030 08/05/14 1100 08/05/14 1130 08/05/14 1200  BP: 116/68 98/48 106/67 110/59  Pulse: 88 86 85 75  Temp:    98.1 F (36.7 C)  TempSrc:    Oral  Resp:      Height:      Weight:      SpO2:           FHT:  FHR: 130 bpm, variability: moderate,  accelerations:  Present,  decelerations:  Absent UC:   regular, every 2-3 minutes SVE:   Dilation: 4.5 Effacement (%): 100 Station: -2 Exam by:: hk  Dilation: 4.5 Effacement (%): 100 Cervical Position: Anterior Station: -2 Presentation: Vertex Exam by:: hk  Pitocin @ 10 mu/min  Labs: Lab Results  Component Value Date   WBC 7.9 08/05/2014   HGB 12.7 08/05/2014   HCT 37.7 08/05/2014   MCV 82.1 08/05/2014   PLT 244 08/05/2014    Assessment / Plan: Augmentation of labor, progressing well. Continues to be in early active labor.   Labor: Progressing on Pitocin. Continue to increase per protocol.  Fetal Wellbeing:  Category I Pain Control:  Epidural  ID: GBS positive, PCN Anticipated MOD:  NSVD  William DaltonMcEachern, Odie Edmonds, MD 08/05/2014, 12:48 PM

## 2014-08-05 NOTE — H&P (Signed)
LABOR ADMISSION HISTORY AND PHYSICAL  Clydene FakeBrenda Nemmers is a 19 y.o. female G1P0 with IUP at 3952w1d by 8week US presenting for active labor after cervical change noted in MAU. She reports +FMs, No LOF, no VB, no blurry vision, headaches or peripheral edema, and RUQ pain. Notes contractions since Saturday that have become regular, however she is not sure how often they are occuring. States she has lost mucous plug. She plans on breast and bottle feeding. She request Depo Provera for birth control.  [place]   Prenatal History/Complications:  Past Medical History: Past Medical History  Diagnosis Date  . UTI (urinary tract infection)   . Asthma     Past Surgical History: Past Surgical History  Procedure Laterality Date  . No past surgeries      Obstetrical History: OB History    Gravida Para Term Preterm AB TAB SAB Ectopic Multiple Living   1               Social History: History   Social History  . Marital Status: Single    Spouse Name: N/A  . Number of Children: N/A  . Years of Education: N/A   Social History Main Topics  . Smoking status: Never Smoker   . Smokeless tobacco: Not on file  . Alcohol Use: No  . Drug Use: No  . Sexual Activity: Yes   Other Topics Concern  . None   Social History Narrative    Family History: Family History  Problem Relation Age of Onset  . Hypertension Father     Allergies: No Known Allergies  Prescriptions prior to admission  Medication Sig Dispense Refill Last Dose  . acetaminophen (TYLENOL) 325 MG tablet Take 650 mg by mouth every 6 (six) hours as needed for moderate pain (Used for tooth problems.).   Past Week at Unknown time  . Prenatal Vit-Fe Fumarate-FA (PRENATAL MULTIVITAMIN) TABS tablet Take 1 tablet by mouth daily at 12 noon.   Past Week at Unknown time  . cyclobenzaprine (FLEXERIL) 10 MG tablet Take 1 tablet (10 mg total) by mouth every 8 (eight) hours as needed for muscle spasms. (Patient not taking:  Reported on 08/03/2014) 30 tablet 1   . ibuprofen (ADVIL,MOTRIN) 600 MG tablet Take 1 tablet (600 mg total) by mouth every 6 (six) hours as needed. (Patient not taking: Reported on 08/03/2014) 20 tablet 0      Review of Systems   All systems reviewed and negative except as stated in HPI  Blood pressure 132/75, pulse 86, temperature 97.9 F (36.6 C), temperature source Oral, resp. rate 16, height 5\' 3"  (1.6 m), weight 102.967 kg (227 lb), last menstrual period 08/30/2013. General appearance: alert, cooperative and moderate distress Lungs: clear to auscultation bilaterally, no increased work of breathing noted Heart: regular rate and rhythm, no murmurs noted Abdomen: soft, non-tender; bowel sounds normal Extremities: Homans sign is negative, no sign of DVT Presentation: cephalic Fetal monitoringBaseline: 130 bpm, Variability: Good {> 6 bpm), Accelerations: Reactive and Decelerations: Absent Uterine activity: Irregular  Dilation: 3 Effacement (%): 90 Station: -3 Exam by:: Elie ConferWeiss K. RN   Prenatal labs: ABO, Rh: AB/Positive/-- (09/24 0000) Antibody: Negative (09/24 0000) Rubella:  Immune RPR: Nonreactive (09/24 0000)  HBsAg: Negative (09/24 0000)  HIV: Non-reactive (09/24 0000)  GBS: Positive (02/02 0000)  1 hr Glucola Normal (61, 108) Genetic screening  Normal Anatomy US Normal  No results found for this or any previous visit (from the past 24 hour(s)).  Patient Active  Problem List   Diagnosis Date Noted  . Post-term pregnancy, 40-42 weeks of gestation   . [redacted] weeks gestation of pregnancy     Assessment: Shaconda Hajduk is a 19 y.o. G1P0 at [redacted]w[redacted]d here for active labor  #Labor: Monitor. Anticipate SVD. #Pain: Percocet PRN. Fentanyl PRN. #FWB: Category 1 #ID: GBS positive. PCN #MOF: Breast/Bottle #MOC: Depo Provera #Circ: N/A  Araceli Bouche 08/05/2014, 12:31 AM

## 2014-08-05 NOTE — Progress Notes (Signed)
LABOR PROGRESS NOTE  Karen Curry is a 19 y.o. G1P0 at 6769w1d  admitted for spontaneous labor.   Subjective: Comfortable with epidural. Resting. Not feeling any pressure.   Objective: BP 122/72 mmHg  Pulse 87  Temp(Src) 98.8 F (37.1 C) (Oral)  Resp 18  Ht 5\' 3"  (1.6 m)  Wt 227 lb (102.967 kg)  BMI 40.22 kg/m2  SpO2 97%  LMP 08/30/2013 or  Filed Vitals:   08/05/14 1800 08/05/14 1830 08/05/14 1900 08/05/14 1930  BP: 117/76 122/79 125/74 122/72  Pulse: 107 98 96 87  Temp:    98.8 F (37.1 C)  TempSrc:      Resp:   18 18  Height:      Weight:      SpO2:           FHT:  FHR: 135 bpm, variability: moderate,  accelerations:  Present,  decelerations:  Absent UC:   regular, every 2 minutes SVE:   Dilation: 10 Effacement (%): 100 Station: +1 Exam by:: Estle Huguley  Dilation: 10 Dilation Complete Date: 08/05/14 Dilation Complete Time: 1950 Effacement (%): 100 Cervical Position: Anterior Station: +1 Presentation: Vertex Exam by:: Dalen Henn[place]  Pitocin @ 10 mu/min  Labs: Lab Results  Component Value Date   WBC 7.9 08/05/2014   HGB 12.7 08/05/2014   HCT 37.7 08/05/2014   MCV 82.1 08/05/2014   PLT 244 08/05/2014    Assessment / Plan: Augmentation of labor, progressing well  Labor: Now completely dilated, but not feeling any pressure. Will allow patient to labor down before starting to push.   Fetal Wellbeing:  Category I Pain Control:  Epidural  ID: GBS positive, PCN Anticipated MOD:  NSVD  William DaltonMcEachern, Tela Kotecki, MD 08/05/2014, 7:51 PM

## 2014-08-05 NOTE — Progress Notes (Signed)
LABOR PROGRESS NOTE  Karen Curry is a 19 y.o. G1P0 at 2074w1d  admitted for early active labor.   Subjective: Comfortable with epidural. Resting.   Objective: BP 100/50 mmHg  Pulse 71  Temp(Src) 98.1 F (36.7 C) (Oral)  Resp 18  Ht 5\' 3"  (1.6 m)  Wt 227 lb (102.967 kg)  BMI 40.22 kg/m2  SpO2 97%  LMP 08/30/2013 or  Filed Vitals:   08/05/14 1600 08/05/14 1606 08/05/14 1630 08/05/14 1701  BP: 100/50  104/52 100/50  Pulse: 72  77 71  Temp:      TempSrc:      Resp:  18    Height:      Weight:      SpO2:           FHT:  FHR: 135 bpm, variability: moderate,  accelerations:  Present,  decelerations:  Absent UC:   regular, every 2-3 minutes SVE:   Dilation: 8 Effacement (%): 90 Station: 0 Exam by:: Danarius Mcconathy  Dilation: 8 Effacement (%): 90 Cervical Position: Anterior Station: 0 Presentation: Vertex Exam by:: Jamara Vary  Pitocin @ 14 mu/min  Labs: Lab Results  Component Value Date   WBC 7.9 08/05/2014   HGB 12.7 08/05/2014   HCT 37.7 08/05/2014   MCV 82.1 08/05/2014   PLT 244 08/05/2014    Assessment / Plan: Augmentation of labor, progressing well  Labor: Progressing on Pitocin. Continue to increase per protocol. AROM at 1430 with clear fluid, IUPC placed.  Fetal Wellbeing:  Category I Pain Control:  Epidural  ID: GBS positive, PCN Anticipated MOD:  NSVD  William DaltonMcEachern, Leani Myron, MD 08/05/2014, 5:42 PM

## 2014-08-05 NOTE — Progress Notes (Signed)
Karen Curry is a 19 y.o. G1P0 at 6236w1d  Subjective: Notes contractions, however they are wide apart. No further complaints.  Objective: BP 114/59 mmHg  Pulse 85  Temp(Src) 98.9 F (37.2 C) (Oral)  Resp 18  Ht 5\' 3"  (1.6 m)  Wt 102.967 kg (227 lb)  BMI 40.22 kg/m2  SpO2 97%  LMP 08/30/2013      FHT:  FHR: 130 bpm, variability: moderate,  accelerations:  Present,  decelerations:  Absent UC:   irregular SVE:   Dilation: 3.5 Effacement (%): 90 Station: -2 Exam by:: L.Mears,Rn  Labs: Lab Results  Component Value Date   WBC 7.9 08/05/2014   HGB 12.7 08/05/2014   HCT 37.7 08/05/2014   MCV 82.1 08/05/2014   PLT 244 08/05/2014    Assessment / Plan: Spontaneous labor, progressing normally  Labor: Progressing normally. Initiating Pitocin.  Preeclampsia:  no signs or symptoms of toxicity Fetal Wellbeing:  Category I Pain Control:  Epidural I/D:  GBS positive. PCN. Anticipated MOD:  NSVD  Araceli BoucheRumley, [place] N 08/05/2014, 7:25 AM

## 2014-08-05 NOTE — Anesthesia Procedure Notes (Signed)
Epidural Patient location during procedure: OB Start time: 08/05/2014 1:20 AM  Staffing Anesthesiologist: Jacklin Zwick JENNETTE Performed by: anesthesiologist   Preanesthetic Checklist Completed: patient identified, site marked, surgical consent, pre-op evaluation, timeout performed, IV checked, risks and benefits discussed and monitors and equipment checked  Epidural Patient position: sitting Prep: site prepped and draped and DuraPrep Patient monitoring: continuous pulse ox and blood pressure Approach: midline Location: L3-L4 Injection technique: LOR saline  Needle:  Needle type: Tuohy  Needle gauge: 17 G Needle length: 9 cm and 9 Needle insertion depth: 6 cm Catheter type: closed end flexible Catheter size: 19 Gauge Catheter at skin depth: 11 cm Test dose: negative  Assessment Events: blood not aspirated, injection not painful, no injection resistance, negative IV test and no paresthesia  Additional Notes Patient identified. Risks/Benefits/Options discussed with patient including but not limited to bleeding, infection, nerve damage, paralysis, failed block, incomplete pain control, headache, blood pressure changes, nausea, vomiting, reactions to medication both or allergic, itching and postpartum back pain. Confirmed with bedside nurse the patient's most recent platelet count. Confirmed with patient that they are not currently taking any anticoagulation, have any bleeding history or any family history of bleeding disorders. Patient expressed understanding and wished to proceed. All questions were answered. Sterile technique was used throughout the entire procedure. Please see nursing notes for vital signs. Test dose was given through epidural catheter and negative prior to continuing to dose epidural or start infusion. Warning signs of high block given to the patient including shortness of breath, tingling/numbness in hands, complete motor block, or any concerning symptoms with  instructions to call for help. Patient was given instructions on fall risk and not to get out of bed. All questions and concerns addressed with instructions to call with any issues or inadequate analgesia.

## 2014-08-05 NOTE — Anesthesia Preprocedure Evaluation (Signed)
Anesthesia Evaluation  Patient identified by MRN, date of birth, ID band Patient awake    Reviewed: Allergy & Precautions, NPO status , Patient's Chart, lab work & pertinent test results  History of Anesthesia Complications Negative for: history of anesthetic complications  Airway Mallampati: III  TM Distance: >3 FB Neck ROM: Full    Dental no notable dental hx. (+) Dental Advisory Given   Pulmonary asthma ,  breath sounds clear to auscultation  Pulmonary exam normal       Cardiovascular negative cardio ROS  Rhythm:Regular Rate:Normal     Neuro/Psych negative neurological ROS  negative psych ROS   GI/Hepatic negative GI ROS, Neg liver ROS,   Endo/Other  Morbid obesity  Renal/GU negative Renal ROS  negative genitourinary   Musculoskeletal negative musculoskeletal ROS (+)   Abdominal   Peds negative pediatric ROS (+)  Hematology negative hematology ROS (+)   Anesthesia Other Findings   Reproductive/Obstetrics (+) Pregnancy                             Anesthesia Physical Anesthesia Plan  ASA: III  Anesthesia Plan: Epidural   Post-op Pain Management:    Induction:   Airway Management Planned:   Additional Equipment:   Intra-op Plan:   Post-operative Plan:   Informed Consent: I have reviewed the patients History and Physical, chart, labs and discussed the procedure including the risks, benefits and alternatives for the proposed anesthesia with the patient or authorized representative who has indicated his/her understanding and acceptance.   Dental advisory given  Plan Discussed with:   Anesthesia Plan Comments:         Anesthesia Quick Evaluation

## 2014-08-05 NOTE — Progress Notes (Signed)
Karen Curry is a 19 y.o. G1P0 at 8764w1d   Subjective: Notes mild irregular contractions. Pain well controlled.  Objective: BP 85/42 mmHg  Pulse 83  Temp(Src) 98.1 F (36.7 C) (Oral)  Resp 18  Ht 5\' 3"  (1.6 m)  Wt 102.967 kg (227 lb)  BMI 40.22 kg/m2  SpO2 97%  LMP 08/30/2013      FHT:  FHR: 130 bpm, variability: moderate,  accelerations:  Present,  decelerations:  Absent UC:   irregular SVE:   Dilation: 3.5 Effacement (%): 90 Station: -2 Exam by:: L.mears,Rn  Labs: Lab Results  Component Value Date   WBC 7.9 08/05/2014   HGB 12.7 08/05/2014   HCT 37.7 08/05/2014   MCV 82.1 08/05/2014   PLT 244 08/05/2014    Assessment / Plan: Spontaneous labor, progressing normally  Labor: Progressing normally Preeclampsia:  no signs or symptoms of toxicity Fetal Wellbeing:  Category I Pain Control:  Epidural I/D:  GBS positive. PCN. Anticipated MOD:  NSVD  Araceli BoucheRumley, [place] N 08/05/2014, 5:10 AM

## 2014-08-06 MED ORDER — BENZOCAINE-MENTHOL 20-0.5 % EX AERO
1.0000 "application " | INHALATION_SPRAY | CUTANEOUS | Status: DC | PRN
  Administered 2014-08-06: 1 via TOPICAL
  Filled 2014-08-06: qty 56

## 2014-08-06 MED ORDER — PRENATAL MULTIVITAMIN CH
1.0000 | ORAL_TABLET | Freq: Every day | ORAL | Status: DC
  Administered 2014-08-06: 1 via ORAL
  Filled 2014-08-06: qty 1

## 2014-08-06 MED ORDER — ONDANSETRON HCL 4 MG PO TABS: 4.0000 mg | ORAL_TABLET | ORAL | Status: DC | PRN

## 2014-08-06 MED ORDER — LIDOCAINE HCL 1 % IJ SOLN
0.0000 mL | Freq: Once | INTRAMUSCULAR | Status: AC | PRN
  Filled 2014-08-06: qty 20

## 2014-08-06 MED ORDER — OXYCODONE-ACETAMINOPHEN 5-325 MG PO TABS: 1.0000 | ORAL_TABLET | ORAL | Status: DC | PRN

## 2014-08-06 MED ORDER — ETONOGESTREL 68 MG ~~LOC~~ IMPL
68.0000 mg | DRUG_IMPLANT | Freq: Once | SUBCUTANEOUS | Status: AC
  Administered 2014-08-06: 68 mg via SUBCUTANEOUS
  Filled 2014-08-06: qty 1

## 2014-08-06 MED ORDER — LANOLIN HYDROUS EX OINT: TOPICAL_OINTMENT | CUTANEOUS | Status: DC | PRN

## 2014-08-06 MED ORDER — DIBUCAINE 1 % RE OINT: 1.0000 "application " | TOPICAL_OINTMENT | RECTAL | Status: DC | PRN

## 2014-08-06 MED ORDER — OXYCODONE-ACETAMINOPHEN 5-325 MG PO TABS: 2.0000 | ORAL_TABLET | ORAL | Status: DC | PRN

## 2014-08-06 MED ORDER — WITCH HAZEL-GLYCERIN EX PADS: 1.0000 "application " | MEDICATED_PAD | CUTANEOUS | Status: DC | PRN

## 2014-08-06 MED ORDER — SENNOSIDES-DOCUSATE SODIUM 8.6-50 MG PO TABS
2.0000 | ORAL_TABLET | ORAL | Status: DC
  Administered 2014-08-06 (×2): 2 via ORAL
  Filled 2014-08-06 (×2): qty 2

## 2014-08-06 MED ORDER — ONDANSETRON HCL 4 MG/2ML IJ SOLN: 4.0000 mg | INTRAMUSCULAR | Status: DC | PRN

## 2014-08-06 MED ORDER — SIMETHICONE 80 MG PO CHEW: 80.0000 mg | CHEWABLE_TABLET | ORAL | Status: DC | PRN

## 2014-08-06 MED ORDER — ZOLPIDEM TARTRATE 5 MG PO TABS: 5.0000 mg | ORAL_TABLET | Freq: Every evening | ORAL | Status: DC | PRN

## 2014-08-06 MED ORDER — DIPHENHYDRAMINE HCL 25 MG PO CAPS: 25.0000 mg | ORAL_CAPSULE | Freq: Four times a day (QID) | ORAL | Status: DC | PRN

## 2014-08-06 MED ORDER — TETANUS-DIPHTH-ACELL PERTUSSIS 5-2.5-18.5 LF-MCG/0.5 IM SUSP
0.5000 mL | Freq: Once | INTRAMUSCULAR | Status: AC
  Administered 2014-08-06: 0.5 mL via INTRAMUSCULAR
  Filled 2014-08-06: qty 0.5

## 2014-08-06 NOTE — Lactation Note (Signed)
This note was copied from the chart of Karen Curry. Lactation Consultation Note  Mom's BF goal are to exclusively BF and if she can not do that she would like to pump her milk and put it into a bottle.  She has not latched the baby since birth.  She was placed in a laid back BF position and began rooting.  She did not latch.  Mom continued trying.  I encouraged her to work with the baby.  I explained supply and demand to her and she needed to use the double electric breast pump everytime the baby was hungry and did not latch.  SHe was agreeable to this.  I explained to her that we support groups and outpatient services.  Patient Name: Karen Clydene FakeBrenda Coulson WUJWJ'XToday's Date: 08/06/2014     Maternal Data    Feeding Feeding Type: Bottle Fed - Formula Nipple Type: Slow - flow  LATCH Score/Interventions                      Lactation Tools Discussed/Used WIC Program: No (needs to renew) Pump Review: Setup, frequency, and cleaning Initiated by:: Hurley CiscoK. Morgan, RN  Date initiated:: 08/06/14   Consult Status      Soyla DryerJoseph, Gevork Ayyad 08/06/2014, 6:33 PM

## 2014-08-06 NOTE — Anesthesia Postprocedure Evaluation (Signed)
  Anesthesia Post-op Note  Patient: Karen Curry  Procedure(s) Performed: * No procedures listed *  Patient Location: Mother/Baby  Anesthesia Type:Epidural  Level of Consciousness: awake  Airway and Oxygen Therapy: Patient Spontanous Breathing  Post-op Pain: mild  Post-op Assessment: Patient's Cardiovascular Status Stable and Respiratory Function Stable  Post-op Vital Signs: stable  Last Vitals:  Filed Vitals:   08/06/14 0541  BP: 129/79  Pulse: 59  Temp: 36.7 C  Resp: 18    Complications: No apparent anesthesia complications

## 2014-08-06 NOTE — Progress Notes (Signed)
Post Partum Day #1 Subjective: no complaints, up ad lib, voiding, tolerating PO and + flatus  Complained of numbness in her left foot from epidural with sensation returning around 4-5 am. Currently no complaints. Pt desires Depo Provera IM for contraception but expresses concerns with weight gain.   Objective: Blood pressure 129/79, pulse 59, temperature 98.1 F (36.7 C), temperature source Oral, resp. rate 18, height 5\' 3"  (1.6 m), weight 102.967 kg (227 lb), last menstrual period 08/30/2013, SpO2 97 %, unknown if currently breastfeeding.  Physical Exam:  General: alert, cooperative, appears stated age and no distress Lochia: appropriate Uterine Fundus: soft 2nd degree perineal tear with no erythema, pus, or drainage DVT Evaluation: No evidence of DVT seen on physical exam. Negative Homan's sign. No cords or calf tenderness. No significant calf/ankle edema.   Recent Labs  08/05/14 0020  HGB 12.7  HCT 37.7    Assessment/Plan: Plan for discharge tomorrow, Breastfeeding, Lactation consult and Contraception via Depo Provera IM to be administered by [facility] at 4-6 week f/u. Discussed Implanon as an alternative but patient refused at this time in favor of Depo. Pt was urged to consider an alternative method of contraception if she is deterred from Depo due to weight gain or any other reason. Pt also plans to bottle feed with Similac formula.   LOS: 1 day   Karen Curry, Karen Curry 08/06/2014, 7:18 AM

## 2014-08-07 ENCOUNTER — Inpatient Hospital Stay (HOSPITAL_COMMUNITY): Admission: RE | Admit: 2014-08-07 | Payer: BC Managed Care – PPO | Source: Ambulatory Visit

## 2014-08-07 NOTE — Lactation Note (Addendum)
This note was copied from the chart of Karen Curry. Lactation Consultation Note  Patient Name: Karen Curry WUJWJ'XToday's Date: 08/07/2014 Reason for consult: Follow-up assessment;Infant < 6lbs Baby 36 hours of life. Mom states that she is not seeing any milk yet. Mom reports that she has not been using DEBP. Discussed the need to pump every 3 hours for 15 minutes. Mom states that she was not aware that she had to pump to get the milk to come in. Discussed supply and demand with mom and enc [facility]. [facility]. Mom states that she is active with WIC and would like to keep pumping and give baby her breast milk with a bottle. Mom states her goal all along has been to give breast milk and formula. Mom states that she does not have the $30 for a WIC pump rental with her now, but that she can send someone up her to get one for her. Discussed [facility] office hours. Mom will decide about pump after [facility]. Gave mom a second hand pump with instructions for most effective pumping because she states she thinks this will work for her. Discussed with mom that a DEBP is best for getting her supply in. Also enc lots of STS. Discussed engorgement prevention/treatment and referred mom to Baby and Me booklet for number of diapers to expect by day of life and EBM storage guidelines. After referencing EBM storage guidelines, mom asked how long milk could be stored. Reviewed the page in the Baby and Me booklet again. Enc mom to continue to feed baby with cues and to attempt at least every 3 hours due to baby's weight. Mom aware of OP/BFSG and LC phone line assistance after D/C.   Maternal Data    Feeding    LATCH Score/Interventions                      Lactation Tools Discussed/Used Tools: Pump Breast pump type: Manual   Consult Status Consult Status: Complete    Kalina Morabito,  Bre [place] 08/07/2014, 9:09 AM

## 2014-08-07 NOTE — Discharge Summary (Signed)
Obstetric Discharge Summary Reason for Admission: onset of labor Prenatal Procedures: ultrasound Intrapartum Procedures: spontaneous vaginal delivery Postpartum Procedures: none Complications-Operative and Postpartum: 2nd degree perineal laceration HEMOGLOBIN  Date Value Ref Range Status  08/05/2014 12.7 12.0 - 15.0 g/dL Final   HCT  Date Value Ref Range Status  08/05/2014 37.7 36.0 - 46.0 % Final    Physical Exam:  General: alert, cooperative and no distress Lochia: appropriate Uterine Fundus: firm DVT Evaluation: No evidence of DVT seen on physical exam. Negative Homan's sign. No cords or calf tenderness. No significant calf/ankle edema.  Discharge Diagnoses: Term Pregnancy-delivered  Discharge Information: Date: 08/07/2014 Activity: pelvic rest Diet: routine Medications: PNV and Ibuprofen Condition: stable Instructions: refer to practice specific booklet Discharge to: home Follow-up Information    [facility] [place] In 5 weeks.   Why:  for postpartum visit   Contact information:   [address] [place] [id number] [phone]      Newborn Data: Live born female  Birth Weight: 5 lb 8.2 oz (2500 g) APGAR: 8, 9  Home with mother.  Karen Curry JEHIEL 08/07/2014, 8:04 AM

## 2014-08-07 NOTE — Discharge Instructions (Signed)
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
## 2014-11-14 NOTE — Procedures (Signed)
This is a delayed entry:  Karen Curry is a 19 y.o. year old African American female here for Nexplanon insertion.  She is postpartum day 1.  Risks/benefits/side effects of Nexplanon have been discussed and her questions have been answered.  Specifically, a failure rate of 05/998 has been reported, with an increased failure rate if pt takes St. John's Wort and/or antiseizure medicaitons.  [name] is aware of the common side effect of irregular bleeding, which the incidence of decreases over time.  Her left arm, approximatly 4 inches proximal from the elbow, was cleansed with alcohol and anesthetized with 2cc of 2% Lidocaine.  The area was cleansed again and the Nexplanon was inserted without difficulty.  A pressure bandage was applied.  Pt was instructed to remove pressure bandage in a few hours, and keep insertion site covered with a bandaid for 3 days.  Back up contraception was recommended for 4 week, along with pelvic rest.  Follow-up scheduled PRN problems.  Perry Mount, MD  11/14/2014 10:53 AM

## 2015-03-27 ENCOUNTER — Encounter: Payer: BC Managed Care – PPO | Admitting: Obstetrics and Gynecology

## 2015-06-06 ENCOUNTER — Telehealth: Payer: Self-pay

## 2015-06-06 NOTE — Telephone Encounter (Signed)
Patient [facility] and patient is interested in another form of birth control due to the weight gain on Nexplanon. [facility] in Adolescent Subspecialty clinic. Patient may be reach at home phone. Armandina StammerJennifer Howard RN BSN

## 2015-06-06 NOTE — Telephone Encounter (Signed)
Please call patient to schedule.

## 2015-06-09 ENCOUNTER — Telehealth: Payer: Self-pay | Admitting: *Deleted

## 2015-06-09 NOTE — Telephone Encounter (Signed)
Caller states [facility] was [facility] for [facility] appointment to help manage her birth control.  Told caller I would direct this message to the right people and to expect a call back.

## 2015-06-09 NOTE — Telephone Encounter (Signed)
She is part of the [facility] project-- please schedule her for first available at her convenience with Bliss Cornerhristy or myself.

## 2015-06-10 NOTE — Telephone Encounter (Signed)
Patient was scheduled for 06/16/15 at 1:30pm with [facility].

## 2015-06-10 NOTE — Telephone Encounter (Signed)
Patient has been scheduled for 06[facility]16/15 at 1:30 [facility].

## 2015-06-16 ENCOUNTER — Ambulatory Visit (INDEPENDENT_AMBULATORY_CARE_PROVIDER_SITE_OTHER): Payer: Medicaid Other | Admitting: Pediatrics

## 2015-06-16 ENCOUNTER — Encounter: Payer: Self-pay | Admitting: Pediatrics

## 2015-06-16 VITALS — BP 129/80 | HR 115 | Ht 63.0 in | Wt 221.6 lb

## 2015-06-16 DIAGNOSIS — L680 Hirsutism: Secondary | ICD-10-CM | POA: Insufficient documentation

## 2015-06-16 DIAGNOSIS — Z3202 Encounter for pregnancy test, result negative: Secondary | ICD-10-CM

## 2015-06-16 DIAGNOSIS — E669 Obesity, unspecified: Secondary | ICD-10-CM | POA: Insufficient documentation

## 2015-06-16 DIAGNOSIS — Z3046 Encounter for surveillance of implantable subdermal contraceptive: Secondary | ICD-10-CM | POA: Insufficient documentation

## 2015-06-16 DIAGNOSIS — Z30017 Encounter for initial prescription of implantable subdermal contraceptive: Secondary | ICD-10-CM

## 2015-06-16 DIAGNOSIS — Z113 Encounter for screening for infections with a predominantly sexual mode of transmission: Secondary | ICD-10-CM

## 2015-06-16 DIAGNOSIS — L83 Acanthosis nigricans: Secondary | ICD-10-CM | POA: Insufficient documentation

## 2015-06-16 DIAGNOSIS — Z130 Encounter for screening for diseases of the blood and blood-forming organs and certain disorders involving the immune mechanism: Secondary | ICD-10-CM

## 2015-06-16 LAB — POCT URINE PREGNANCY: Preg Test, Ur: NEGATIVE

## 2015-06-16 LAB — POCT HEMOGLOBIN: Hemoglobin: 13.6 g/dL (ref 12.2–16.2)

## 2015-06-16 NOTE — Patient Instructions (Addendum)
We will check some labs today to see what your hormone levels look like and to see what your diabetes risk is. This will help us give you good information about your health and how your hormones are.   We will see you next week for your nexplanon removal.

## 2015-06-16 NOTE — Progress Notes (Signed)
Pre-Visit Planning  Karen Curry  is a 20 y.o. female referred by No PCP Per Patient.   Patient is new to adolescent clinic.   Previous Psych Screenings? no  Patient referred by womens for concern about weight gain with nexplanon.   Clinical Staff Visit Tasks:   - Urine GC/CT due? yes - Psych Screenings Due? no - determine if patient has PCP - dirty urine for gc/chlamydia - fs hemoglobin if significant bleeding  - urine preg if not having periods at all  - contraceptive options handout   Provider Visit Tasks: - discuss concerns with nexplanon and other options  - [facility] - Pertinent Labs? no

## 2015-06-16 NOTE — Progress Notes (Signed)
THIS RECORD MAY CONTAIN CONFIDENTIAL INFORMATION THAT SHOULD NOT BE RELEASED WITHOUT REVIEW OF THE SERVICE PROVIDER.  Adolescent Medicine Consultation Initial Visit Karen Curry  is a 20 y.o. female referred by No ref. provider found here today for evaluation of weight gain with nexplanon.      Growth Chart Viewed? yes  Previsit planning completed:  Yes  Pre-Visit Planning  Karen Curry is a 20 y.o. female referred by No PCP Per Patient.  Patient is new to adolescent clinic.   Previous Psych Screenings? no  Patient referred by womens for concern about weight gain with nexplanon.   Clinical Staff Visit Tasks:  - Urine GC/CT due? yes - Psych Screenings Due? no - determine if patient has PCP - dirty urine for gc/chlamydia - fs hemoglobin if significant bleeding  - urine preg if not having periods at all  - contraceptive options handout   Provider Visit Tasks: - discuss concerns with nexplanon and other options  - [place] Involvement? Unknown - Pertinent Labs? no     History was provided by the patient.  PCP Confirmed?  no  My Chart Activated?   no  - patient declined   HPI:   Nexplanon inserted March 2016. She had some arm swelling a few months ago and was concerned so she went to the [facility] clinic but they wanted her to pay to be seen so wasn't seen. Arm swelling has improved.   Wants nexplanon removed because she is not seeing her period and is not spotting at all.  She feels like she has gained weight. She eats a lot- feels like it makes her eat more. She is waking up in the middle of the night to eat. If she sees food she eats it. She likes to drink soda, no juice but mostly water.  She was trying to exercise but her schedule is difficult- she is home with her daughter alone. She does some sit ups at home.   Does not want any more kids anytime soon. She has never been on birth control in the past. Never sexually active since daughter was born. Her baby's  father is in [place]. She is no longer with him and has no intentions of sexual activity any time soon.  Has mom and brother in [place] for family support. Mom can watch baby during 5p-12p shift. She is going to start working a [place].   Has always had regular periods prior to pregnancy but has had significant hirsutism since she was 20 years old. She notes that their "tribe" from [place] have always been hair people. She does hair removal on her face and abdomen regularly.   Mood has been good and notes she is generally happy. Sleeps well.   No LMP recorded (lmp unknown).  Review of Systems  Constitutional: Negative for weight loss and malaise/fatigue.  Eyes: Negative for blurred vision.  Respiratory: Negative for shortness of breath.   Cardiovascular: Negative for chest pain and palpitations.  Gastrointestinal: Negative for nausea, vomiting, abdominal pain and constipation.  Genitourinary: Negative for dysuria.  Musculoskeletal: Negative for myalgias.  Neurological: Negative for dizziness and headaches.  Psychiatric/Behavioral: Negative for depression.     No Known Allergies Outpatient Encounter Prescriptions as of 06/16/2015  Medication Sig  . [DISCONTINUED] acetaminophen (TYLENOL) 325 MG tablet Take 650 mg by mouth every 6 (six) hours as needed for moderate pain (Used for tooth problems.).  . [DISCONTINUED] cyclobenzaprine (FLEXERIL) 10 MG tablet Take 1 tablet (10 mg total) by mouth every  8 (eight) hours as needed for muscle spasms. (Patient not taking: Reported on 08/03/2014)  . [DISCONTINUED] ibuprofen (ADVIL,MOTRIN) 600 MG tablet Take 1 tablet (600 mg total) by mouth every 6 (six) hours as needed. (Patient not taking: Reported on 08/03/2014)  . [DISCONTINUED] Prenatal Vit-Fe Fumarate-FA (PRENATAL MULTIVITAMIN) TABS tablet Take 1 tablet by mouth daily at 12 noon.   No facility-administered encounter medications on file as of 06/16/2015.     Patient Active Problem List   Diagnosis  Date Noted  . Encounter for surveillance of implantable subdermal contraceptive 06/16/2015    Past Medical History:  Reviewed and updated?  yes Past Medical History  Diagnosis Date  . UTI (urinary tract infection)   . Asthma     Family History: Reviewed and updated? yes Family History  Problem Relation Age of Onset  . Hypertension Father     Social History   Social History Narrative     The following portions of the patient's history were reviewed and updated as appropriate: allergies, current medications, past family history, past medical history, past social history, past surgical history and problem list.  Physical Exam:  Filed Vitals:   06/16/15 1357  BP: 129/80  Pulse: 115  Height: 5\' 3"  (1.6 m)  Weight: 221 lb 9.6 oz (100.517 kg)   BP 129/80 mmHg  Pulse 115  Ht 5\' 3"  (1.6 m)  Wt 221 lb 9.6 oz (100.517 kg)  BMI 39.26 kg/m2  LMP  (LMP Unknown) Body mass index: body mass index is 39.26 kg/(m^2). Facility age limit for growth percentiles is 20 years.  Physical Exam  Constitutional: She is oriented to person, place, and time. She appears well-developed and well-nourished.  HENT:  Head: Normocephalic.  Significant hirsutism   Neck: No thyromegaly present.  2+ acanthosis   Cardiovascular: Normal rate, regular rhythm, normal heart sounds and intact distal pulses.   Pulmonary/Chest: Effort normal and breath sounds normal.  Abdominal: Soft. Bowel sounds are normal. There is no tenderness.  Musculoskeletal: Normal range of motion.  Neurological: She is alert and oriented to person, place, and time.  Skin: Skin is warm and dry.  Psychiatric: She has a normal mood and affect.     Assessment/Plan: 1. Encounter for surveillance of implantable subdermal contraceptive Concerns about weight gain with nexplanon. In discussion, she has actually just not lost any weight since her baby was born. She does note that she does a lot of boredom eating and feels hungry a lot.  She thinks she still would like to get it taken out.    2. Routine screening for STI (sexually transmitted infection) Per clinic protocol.  - GC/Chlamydia Probe Amp  3. Pregnancy examination or test, negative result Given amenorrhea, screened for pregnancy. Negative.  - POCT urine pregnancy  4. Hirsutism Significant facial and abdominal hirsutism. Started around puberty. Concern for PCOS-- nexplanon may be helping hirsutism, however, will look at labs to see if any further recommendations should be made.  - TSH - Luteinizing hormone - Prolactin - Follicle stimulating hormone - DHEA-sulfate - Comprehensive metabolic panel - Testosterone, Free, Total, SHBG  5. 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 Feeling hungry after meals may be related to excess insulin vs. Nexplanon. I encouraged her to get established at family medicine ASAP so she has a PCP.  - Hemoglobin A1c - VITAMIN D 25 Hydroxy (Vit-D Deficiency, Fractures)  6. Obesity (BMI 30-39.9) Has been about same weight since baby was born. ~20 pound weight gain since  pre-pregnancy. Likely not nexplanon related.    Follow-up:   1 week for discussion of labs and nexplanon removal.   Medical decision-making:  > 40 minutes spent, more than 50% of appointment was spent discussing diagnosis and management of symptoms

## 2015-06-17 LAB — COMPREHENSIVE METABOLIC PANEL
ALBUMIN: 3.9 g/dL (ref 3.6–5.1)
ALT: 13 U/L (ref 6–29)
AST: 14 U/L (ref 10–30)
Alkaline Phosphatase: 81 U/L (ref 33–115)
BUN: 14 mg/dL (ref 7–25)
CHLORIDE: 104 mmol/L (ref 98–110)
CO2: 27 mmol/L (ref 20–31)
Calcium: 9.4 mg/dL (ref 8.6–10.2)
Creat: 0.77 mg/dL (ref 0.50–1.10)
Glucose, Bld: 81 mg/dL (ref 65–99)
Potassium: 4.2 mmol/L (ref 3.5–5.3)
SODIUM: 139 mmol/L (ref 135–146)
Total Bilirubin: 0.3 mg/dL (ref 0.2–1.2)
Total Protein: 7.7 g/dL (ref 6.1–8.1)

## 2015-06-17 LAB — FOLLICLE STIMULATING HORMONE: FSH: 5.8 m[IU]/mL

## 2015-06-17 LAB — TESTOSTERONE, FREE, TOTAL, SHBG
SEX HORMONE BINDING: 26 nmol/L (ref 17–124)
TESTOSTERONE-% FREE: 2 % (ref 0.4–2.4)
TESTOSTERONE: 21 ng/dL
Testosterone, Free: 4.3 pg/mL (ref 0.6–6.8)

## 2015-06-17 LAB — VITAMIN D 25 HYDROXY (VIT D DEFICIENCY, FRACTURES): VIT D 25 HYDROXY: 18 ng/mL — AB (ref 30–100)

## 2015-06-17 LAB — TSH: TSH: 1.607 u[IU]/mL (ref 0.350–4.500)

## 2015-06-17 LAB — DHEA-SULFATE: DHEA-SO4: 208 ug/dL (ref 51–321)

## 2015-06-17 LAB — GC/CHLAMYDIA PROBE AMP
CT Probe RNA: NOT DETECTED
GC Probe RNA: NOT DETECTED

## 2015-06-17 LAB — LUTEINIZING HORMONE: LH: 2.4 m[IU]/mL

## 2015-06-17 LAB — PROLACTIN: Prolactin: 17.7 ng/mL

## 2015-06-17 LAB — HEMOGLOBIN A1C
Hgb A1c MFr Bld: 5.5 % (ref <5.7)
MEAN PLASMA GLUCOSE: 111 mg/dL (ref <117)

## 2015-06-25 ENCOUNTER — Encounter: Payer: Self-pay | Admitting: Pediatrics

## 2015-06-25 ENCOUNTER — Ambulatory Visit (INDEPENDENT_AMBULATORY_CARE_PROVIDER_SITE_OTHER): Payer: Medicaid Other | Admitting: Pediatrics

## 2015-06-25 VITALS — BP 126/84 | HR 77 | Ht 62.6 in | Wt 217.6 lb

## 2015-06-25 DIAGNOSIS — Z3046 Encounter for surveillance of implantable subdermal contraceptive: Secondary | ICD-10-CM

## 2015-06-25 DIAGNOSIS — L680 Hirsutism: Secondary | ICD-10-CM

## 2015-06-25 DIAGNOSIS — L83 Acanthosis nigricans: Secondary | ICD-10-CM

## 2015-06-25 DIAGNOSIS — Z30433 Encounter for removal and reinsertion of intrauterine contraceptive device: Secondary | ICD-10-CM

## 2015-06-25 NOTE — Progress Notes (Signed)

## 2015-06-25 NOTE — Patient Instructions (Signed)
We took your nexplanon out today. Keep it wrapped and dry for 24 hours. Steri strips will fall off.  If you are sexually active, use condoms always to prevent pregnancy.   We will see you back in a month for any concerns you may have. If you have concerns about the site before then on your arm, call us.

## 2015-06-25 NOTE — Progress Notes (Signed)
THIS RECORD MAY CONTAIN CONFIDENTIAL INFORMATION THAT SHOULD NOT BE RELEASED WITHOUT REVIEW OF THE SERVICE PROVIDER.  Adolescent Medicine Consultation Follow-Up Visit Karen Curry  is a 20 y.o. female referred by No ref. provider found here today for follow-up.    Previsit planning completed:  no  Growth Chart Viewed? yes   History was provided by the patient.  PCP Confirmed?  no  My Chart Activated?   no   HPI:   She has changed her eating habits and she has lost weight but she still wants nexplanon out. She can't really rationalize why, she just has her mind made up that it has caused her weight gain.  Hirsutism is likely improved with nexplanon but she isn't really worried about that.  She understands the future cost that might be associated with reproductive health, especially if her medicaid lapses.   No LMP recorded (lmp unknown). No Known Allergies No outpatient encounter prescriptions on file as of 06/25/2015.   No facility-administered encounter medications on file as of 06/25/2015.     Patient Active Problem List   Diagnosis Date Noted  . Encounter for surveillance of implantable subdermal contraceptive 06/16/2015  . Acanthosis 06/16/2015  . Hirsutism 06/16/2015  . Obesity (BMI 30-39.9) 06/16/2015     Social History   Social History Narrative     The following portions of the patient's history were reviewed and updated as appropriate: allergies, current medications, past family history, past medical history, past social history, past surgical history and problem list.  Physical Exam:  Filed Vitals:   06/25/15 1347  BP: 126/84  Pulse: 77  Height: 5' 2.6" (1.59 m)  Weight: 217 lb 9.6 oz (98.703 kg)   BP 126/84 mmHg  Pulse 77  Ht 5' 2.6" (1.59 m)  Wt 217 lb 9.6 oz (98.703 kg)  BMI 39.04 kg/m2  LMP  (LMP Unknown) Body mass index: body mass index is 39.04 kg/(m^2). Facility age limit for growth percentiles is 20 years.  Physical Exam  Constitutional:  She is oriented to person, place, and time. She appears well-developed and well-nourished.  HENT:  Head: Normocephalic.  Neck: No thyromegaly present.  Cardiovascular: Normal rate, regular rhythm, normal heart sounds and intact distal pulses.   Pulmonary/Chest: Effort normal and breath sounds normal.  Abdominal: Soft. Bowel sounds are normal. There is no tenderness.  Musculoskeletal: Normal range of motion.  Neurological: She is alert and oriented to person, place, and time.  Skin: Skin is warm and dry.  Psychiatric: She has a normal mood and affect.     Assessment/Plan: 1. Encounter for Nexplanon removal Consent signed. Patient understands risks vs. Benefits. See removal procedure note.   2. Hirsutism Likely familial and potentially hyperandrogen related. Will monitor.   3. Acanthosis A1C normal at this time.    Follow-up:  1 month   Medical decision-making:  > 40 minutes spent, more than 50% of appointment was spent discussing diagnosis and management of symptoms

## 2015-07-25 ENCOUNTER — Encounter: Payer: Self-pay | Admitting: Pediatrics

## 2015-07-25 NOTE — Progress Notes (Signed)
Pre-Visit Planning  Karen Curry  is a 20 y.o. female referred by No PCP Per Patient.   Last seen in Adolescent Medicine Clinic on 06/25/15 for nexplanon removal.   Previous Psych Screenings? No  Treatment plan at last visit included remove nexplanon.   Clinical Staff Visit Tasks:   - Urine GC/CT due? no - Psych Screenings Due? No  Provider Visit Tasks: - assess site - discuss any desire for further contraception - [facility] Involvement? No - Pertinent Labs? No

## 2015-07-29 ENCOUNTER — Ambulatory Visit: Payer: Self-pay | Admitting: Pediatrics

## 2018-02-11 ENCOUNTER — Encounter (HOSPITAL_COMMUNITY): Payer: Self-pay | Admitting: Emergency Medicine

## 2018-02-11 ENCOUNTER — Emergency Department (HOSPITAL_COMMUNITY)
Admission: EM | Admit: 2018-02-11 | Discharge: 2018-02-12 | Disposition: A | Discharge status: Home / Self Care | Payer: Self-pay | Attending: Emergency Medicine | Admitting: Emergency Medicine

## 2018-02-11 DIAGNOSIS — J45909 Unspecified asthma, uncomplicated: Secondary | ICD-10-CM | POA: Insufficient documentation

## 2018-02-11 DIAGNOSIS — R899 Unspecified abnormal finding in specimens from other organs, systems and tissues: Secondary | ICD-10-CM

## 2018-02-11 DIAGNOSIS — R0789 Other chest pain: Secondary | ICD-10-CM | POA: Insufficient documentation

## 2018-02-11 DIAGNOSIS — R7611 Nonspecific reaction to tuberculin skin test without active tuberculosis: Secondary | ICD-10-CM | POA: Insufficient documentation

## 2018-02-11 NOTE — ED Triage Notes (Signed)
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  Was supposed to go back for a chest xray but they were closed today.  Here to get checked out. Also endorses chest pain.  States its weird.

## 2018-02-12 ENCOUNTER — Emergency Department (HOSPITAL_COMMUNITY): Payer: Self-pay

## 2018-02-12 LAB — CBC
HEMATOCRIT: 38.4 % (ref 36.0–46.0)
HEMOGLOBIN: 12.4 g/dL (ref 12.0–15.0)
MCH: 28.1 pg (ref 26.0–34.0)
MCHC: 32.3 g/dL (ref 30.0–36.0)
MCV: 87.1 fL (ref 78.0–100.0)
Platelets: 264 10*3/uL (ref 150–400)
RBC: 4.41 MIL/uL (ref 3.87–5.11)
RDW: 13.7 % (ref 11.5–15.5)
WBC: 5.9 10*3/uL (ref 4.0–10.5)

## 2018-02-12 LAB — BASIC METABOLIC PANEL
ANION GAP: 12 (ref 5–15)
BUN: 12 mg/dL (ref 6–20)
CHLORIDE: 102 mmol/L (ref 98–111)
CO2: 26 mmol/L (ref 22–32)
Calcium: 9.2 mg/dL (ref 8.9–10.3)
Creatinine, Ser: 0.8 mg/dL (ref 0.44–1.00)
GFR calc non Af Amer: >60 mL/min (ref >60)
Glucose, Bld: 99 mg/dL (ref 70–99)
Potassium: 3.6 mmol/L (ref 3.5–5.1)
Sodium: 140 mmol/L (ref 135–145)

## 2018-02-12 LAB — D-DIMER, QUANTITATIVE (NOT AT ARMC): D DIMER QUANT: 0.43 ug{FEU}/mL (ref 0.00–0.50)

## 2018-02-12 LAB — I-STAT TROPONIN, ED: Troponin i, poc: 0 ng/mL (ref 0.00–0.08)

## 2018-02-12 LAB — I-STAT BETA HCG BLOOD, ED (MC, WL, AP ONLY): I-stat hCG, quantitative: <5 m[IU]/mL (ref <5)

## 2018-02-12 NOTE — Discharge Instructions (Addendum)
Your chest x-ray is normal.  There is no evidence of heart attack or blood clot in the lung.  Follow-up with the health department regarding your TB test.  Return to the ED if you develop new or worsening symptoms.

## 2018-02-12 NOTE — ED Notes (Signed)
ED Provider at bedside. 

## 2018-02-12 NOTE — ED Provider Notes (Signed)
[facility] EMERGENCY DEPARTMENT Provider Note   CSN: [id number] Arrival date & time: 02/11/18  2321     History   Chief Complaint Chief Complaint  Patient presents with  . Positive TB screen  . Chest Pain    HPI Karen Curry is a 22 y.o. female.  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  States she had this placed in a clinic on [place] on September 12.  She was supposed to go back for a chest x-ray but they are closed today so she came here instead.  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  She was apparently treated for 3 months despite a negative chest x-ray.  She denies any symptoms currently.  No weight loss, no fever, no night sweats no coughing.  She is having intermittent central chest pain today that comes and goes lasting for a few seconds at a time it does not radiate to her arm, back or neck.  Denies any shortness of breath, abdominal pain, vomiting.  No dizziness or lightheadedness.  No leg pain or leg swelling.  Patient unclear whether she had a TB vaccine in the past when she was in [place].  The history is provided by the patient.  Chest Pain   Pertinent negatives include no abdominal pain, no back pain, no cough, no dizziness, no fever, no nausea, no numbness, no shortness of breath, no vomiting and no weakness.  Pertinent negatives for past medical history include no seizures.    Past Medical History:  Diagnosis Date  . Asthma   . UTI (urinary tract infection)     Patient Active Problem List   Diagnosis Date Noted  . Encounter for surveillance of implantable subdermal contraceptive 06/16/2015  . Acanthosis 06/16/2015  . Hirsutism 06/16/2015  . Obesity (BMI 30-39.9) 06/16/2015    Past Surgical History:  Procedure Laterality Date  . NO PAST SURGERIES       OB History    Gravida  1   Para  1   Term  1   Preterm      AB      Living  1     SAB      TAB      Ectopic      Multiple  0   Live Births  1            Home Medications    Prior to Admission medications   Not on File    Family History Family History  Problem Relation Age of Onset  . Hypertension Father     Social History Social History   Tobacco Use  . Smoking status: Never Smoker  . Smokeless tobacco: Never Used  Substance Use Topics  . Alcohol use: No    Alcohol/week: 0.0 standard drinks  . Drug use: No     Allergies   Patient has no known allergies.   Review of Systems Review of Systems  Constitutional: Negative for activity change, appetite change, fatigue, fever and unexpected weight change.  HENT: Negative for congestion and rhinorrhea.   Eyes: Negative for visual disturbance.  Respiratory: Negative for cough, chest tightness and shortness of breath.   Cardiovascular: Positive for chest pain.  Gastrointestinal: Negative for abdominal pain, nausea and vomiting.  Genitourinary: Negative for dysuria, hematuria, vaginal bleeding and vaginal discharge.  Musculoskeletal: Negative for arthralgias, back pain and myalgias.  Skin: Negative  for rash.  Neurological: Negative for dizziness, seizures, facial asymmetry, weakness and numbness.   all other systems are negative except as noted in the HPI and PMH.     Physical Exam Updated Vital Signs BP 122/83 (BP Location: Right Arm)   Pulse 83   Temp 99.5 F (37.5 C) (Oral)   Resp 14   Ht 5\' 3"  (1.6 m)   Wt 80.3 kg   SpO2 100%   BMI 31.35 kg/m   Physical Exam  Constitutional: She is oriented to person, place, and time. She appears well-developed and well-nourished. No distress.  HENT:  Head: Normocephalic and atraumatic.  Mouth/Throat: Oropharynx is clear and moist. No oropharyngeal exudate.  Eyes: Pupils are equal, round, and reactive to light. Conjunctivae and EOM are normal.  Neck: Normal range of motion. Neck supple.  No meningismus.  Cardiovascular: Normal rate, regular  rhythm, normal heart sounds and intact distal pulses.  No murmur heard. Pulmonary/Chest: Effort normal and breath sounds normal. No respiratory distress. She exhibits no tenderness.  Abdominal: Soft. There is no tenderness. There is no rebound and no guarding.  Musculoskeletal: Normal range of motion. She exhibits no edema or tenderness.  Neurological: She is alert and oriented to person, place, and time. No cranial nerve deficit. She exhibits normal muscle tone. Coordination normal.  No ataxia on finger to nose bilaterally. No pronator drift. 5/5 strength throughout. CN 2-12 intact.Equal grip strength. Sensation intact.   Skin: Skin is warm. Capillary refill takes less than 2 seconds.  Psychiatric: She has a normal mood and affect. Her behavior is normal.  Nursing note and vitals reviewed.    ED Treatments / Results  Labs (all labs ordered are listed, but only abnormal results are displayed) Labs Reviewed  BASIC METABOLIC PANEL  CBC  D-DIMER, QUANTITATIVE (NOT AT [facility])  I-STAT TROPONIN, ED  I-STAT BETA HCG BLOOD, ED ([facility], [facility], [facility] ONLY)    EKG EKG Interpretation  Date/Time:  Saturday February 11 2018 23:47:52 EDT Ventricular Rate:  85 PR Interval:  142 QRS Duration: 76 QT Interval:  362 QTC Calculation: 430 R Axis:   75 Text Interpretation:  Normal sinus rhythm Normal ECG No previous ECGs available Confirmed by Glynn Octaveancour, Patrici Minnis [phone]) on 02/12/2018 12:21:00 AM   Radiology Dg Chest 2 View  Result Date: 02/12/2018 CLINICAL DATA:  Chest pain.  Positive PPD. EXAM: CHEST - 2 VIEW COMPARISON:  None. FINDINGS: Cardiomediastinal silhouette is normal. No pleural effusions or focal consolidations. Trachea projects midline and there is no pneumothorax. Soft tissue planes and included osseous structures are non-suspicious. IMPRESSION: Normal chest. Electronically Signed   By: Awilda Metroourtnay  Bloomer M.D.   On: 02/12/2018 01:14    Procedures Procedures (including critical care  time)  Medications Ordered in ED Medications - No data to display   Initial Impression / Assessment and Plan / ED Course  I have reviewed the triage vital signs and the nursing notes.  Pertinent labs & imaging results that were available during my care of the patient were reviewed by me and considered in my medical decision making (see chart for details).    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  She denies any symptoms currently.  Her chest pain today is atypical for ACS.  EKG is sinus rhythm  Chest x-ray is negative.  Discussed with Dr. Luciana Axeomer of infectious disease.  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  Is also unclear whether she got the BCG vaccination.  He feels with negative chest x-ray, patient does not need any further work-up and she should not have PPD test in the future.  Patient's chest pain is atypical for ACS or PE.  Troponin d-dimer negative.  Chest x-ray is negative.  Discussed That she needs to  follow-up with the health department regarding her TB test.  Return precautions discussed. Final Clinical Impressions(s) / ED Diagnoses   Final diagnoses:  Abnormal laboratory test result  Atypical chest pain    ED Discharge Orders    None       Vanita Cannell, Jeannett Senior, MD 02/12/18 [id number]

## 2018-02-12 NOTE — ED Notes (Signed)
Patient left at this time with all belongings. 

## 2018-05-31 NOTE — L&D Delivery Note (Signed)
Delivery Note Patient pushed for 10 minutes.  At 10:26 PM a viable female was delivered via Vaginal, Spontaneous (Presentation: LOA ).  APGAR: 9, 9; weight pending .   Placenta status:  Spontaneous, in tact with a 3V cord.   Anesthesia:  Epidural Episiotomy: None Lacerations: None Suture Repair: n/a Est. Blood Loss (mL): 100  Mom to postpartum.  Baby to Couplet care / Skin to Skin.  [place] 04/25/2019, 10:42 PM

## 2018-08-29 ENCOUNTER — Telehealth: Payer: Self-pay | Admitting: Family Medicine

## 2018-08-29 ENCOUNTER — Encounter (HOSPITAL_COMMUNITY): Payer: Self-pay | Admitting: Emergency Medicine

## 2018-08-29 ENCOUNTER — Other Ambulatory Visit: Payer: Self-pay

## 2018-08-29 ENCOUNTER — Encounter: Payer: Self-pay | Admitting: Family Medicine

## 2018-08-29 ENCOUNTER — Ambulatory Visit (HOSPITAL_COMMUNITY)
Admission: EM | Admit: 2018-08-29 | Discharge: 2018-08-29 | Disposition: A | Discharge status: Home / Self Care | Payer: Medicaid Other | Attending: Family Medicine | Admitting: Family Medicine

## 2018-08-29 DIAGNOSIS — Z3201 Encounter for pregnancy test, result positive: Secondary | ICD-10-CM | POA: Insufficient documentation

## 2018-08-29 DIAGNOSIS — N390 Urinary tract infection, site not specified: Secondary | ICD-10-CM

## 2018-08-29 DIAGNOSIS — N3000 Acute cystitis without hematuria: Secondary | ICD-10-CM | POA: Insufficient documentation

## 2018-08-29 LAB — POCT URINALYSIS DIP (DEVICE)
BILIRUBIN URINE: NEGATIVE
Glucose, UA: NEGATIVE mg/dL
HGB URINE DIPSTICK: NEGATIVE
KETONES UR: NEGATIVE mg/dL
Leukocytes,Ua: NEGATIVE
NITRITE: POSITIVE — AB
PH: 7.5 (ref 5.0–8.0)
PROTEIN: NEGATIVE mg/dL
Specific Gravity, Urine: 1.02 (ref 1.005–1.030)
UROBILINOGEN UA: 0.2 mg/dL (ref 0.0–1.0)

## 2018-08-29 LAB — POCT PREGNANCY, URINE: Preg Test, Ur: POSITIVE — AB

## 2018-08-29 MED ORDER — CEPHALEXIN 500 MG PO CAPS: 500.0000 mg | ORAL_CAPSULE | Freq: Two times a day (BID) | ORAL | 0 refills | Status: AC

## 2018-08-29 NOTE — Telephone Encounter (Signed)
The patient stated she visited an urgent care and received a positive pregnancy test. Called to schedule a new ob appointment.

## 2018-08-29 NOTE — ED Provider Notes (Signed)
[facility]    CSN: [id number] Arrival date & time: 08/29/18  1001     History   Chief Complaint Chief Complaint  Patient presents with  . Possible Pregnancy    HPI Karen Curry is a 23 y.o. female.   Pt is a 23 year old female that presents with possible pregnancy. She had 2 positive pregnancy tests at home and is here to confirm.  She is also been having some lower abdominal discomfort.  Describes this as intermittent cramping.  Denies any associated fevers, chills, dysuria, hematuria, urinary frequency.  Denies any back pain, nausea, vomiting.  Denies any vaginal discharge, itching or irritation.  Patient's last menstrual period was 07/29/2018.  She does not currently have an OB/GYN.  ROS per HPI       Past Medical History:  Diagnosis Date  . Asthma   . UTI (urinary tract infection)     Patient Active Problem List   Diagnosis Date Noted  . Encounter for surveillance of implantable subdermal contraceptive 06/16/2015  . Acanthosis 06/16/2015  . Hirsutism 06/16/2015  . Obesity (BMI 30-39.9) 06/16/2015    Past Surgical History:  Procedure Laterality Date  . NO PAST SURGERIES      OB History    Gravida  1   Para  1   Term  1   Preterm      AB      Living  1     SAB      TAB      Ectopic      Multiple  0   Live Births  1            Home Medications    Prior to Admission medications   Medication Sig Start Date End Date Taking? Authorizing Provider  cephALEXin (KEFLEX) 500 MG capsule Take 1 capsule (500 mg total) by mouth 2 (two) times daily for 7 days. 08/29/18 09/05/18  Janace Aris, NP    Family History Family History  Problem Relation Age of Onset  . Hypertension Father     Social History Social History   Tobacco Use  . Smoking status: Never Smoker  . Smokeless tobacco: Never Used  Substance Use Topics  . Alcohol use: No    Alcohol/week: 0.0 standard drinks  . Drug use: No     Allergies   Patient has no  known allergies.   Review of Systems Review of Systems   Physical Exam Triage Vital Signs ED Triage Vitals  Enc Vitals Group     BP 08/29/18 1022 111/65     Pulse Rate 08/29/18 1022 70     Resp 08/29/18 1022 18     Temp 08/29/18 1022 98.5 F (36.9 C)     Temp Source 08/29/18 1022 Oral     SpO2 08/29/18 1022 100 %     Weight --      Height --      Head Circumference --      Peak Flow --      Pain Score 08/29/18 1019 4     Pain Loc --      Pain Edu? --      Excl. in GC? --    No data found.  Updated Vital Signs BP 111/65 (BP Location: Left Arm)   Pulse 70   Temp 98.5 F (36.9 C) (Oral)   Resp 18   LMP 07/29/2018   SpO2 100%   Visual Acuity Right Eye Distance:   Left  Eye Distance:   Bilateral Distance:    Right Eye Near:   Left Eye Near:    Bilateral Near:     Physical Exam Vitals signs and nursing note reviewed.  Constitutional:      General: She is not in acute distress.    Appearance: Normal appearance. She is not ill-appearing, toxic-appearing or diaphoretic.  HENT:     Head: Normocephalic.     Nose: Nose normal.     Mouth/Throat:     Pharynx: Oropharynx is clear.  Eyes:     Conjunctiva/sclera: Conjunctivae normal.  Neck:     Musculoskeletal: Normal range of motion.  Pulmonary:     Effort: Pulmonary effort is normal.  Abdominal:     Palpations: Abdomen is soft.     Tenderness: There is no abdominal tenderness.  Musculoskeletal: Normal range of motion.  Skin:    General: Skin is warm and dry.     Findings: No rash.  Neurological:     Mental Status: She is alert.  Psychiatric:        Mood and Affect: Mood normal.      UC Treatments / Results  Labs (all labs ordered are listed, but only abnormal results are displayed) Labs Reviewed  POCT PREGNANCY, URINE - Abnormal; Notable for the following components:      Result Value   Preg Test, Ur POSITIVE (*)    All other components within normal limits  POCT URINALYSIS DIP (DEVICE) -  Abnormal; Notable for the following components:   Nitrite POSITIVE (*)    All other components within normal limits  URINE CULTURE  POC URINE PREG, ED    EKG None  Radiology No results found.  Procedures Procedures (including critical care time)  Medications Ordered in UC Medications - No data to display  Initial Impression / Assessment and Plan / UC Course  I have reviewed the triage vital signs and the nursing notes.  Pertinent labs & imaging results that were available during my care of the patient were reviewed by me and considered in my medical decision making (see chart for details).     Urine positive for pregnancy and urinary tract infection The infection is most likely cause of her lower abdominal discomfort We will treat with Keflex twice a day for 7 days Sending for culture 2 contacts placed on patient's discharge instructions to follow-up with OB/GYN for further evaluation and management of her pregnancy  Final Clinical Impressions(s) / UC Diagnoses   Final diagnoses:  None     Discharge Instructions     Your pregnancy test was positive and urine was positive for an infection.  We will go ahead and treat with antibiotics and send your urine for culture.  I am putting a contact on your discharge instructions to follow-up with OB/GYN for further evaluation and management of your pregnancy You need to start taking a daily prenatal vitamin    ED Prescriptions    Medication Sig Dispense Auth. Provider   cephALEXin (KEFLEX) 500 MG capsule Take 1 capsule (500 mg total) by mouth 2 (two) times daily for 7 days. 14 capsule Dahlia Byes A, NP     Controlled Substance Prescriptions [place] Controlled Substance Registry consulted? Not Applicable   Janace Aris, NP 08/29/18 1120

## 2018-08-29 NOTE — Discharge Instructions (Addendum)
Your pregnancy test was positive and urine was positive for an infection.  We will go ahead and treat with antibiotics and send your urine for culture.  I am putting a contact on your discharge instructions to follow-up with OB/GYN for further evaluation and management of your pregnancy You need to start taking a daily prenatal vitamin

## 2018-08-29 NOTE — ED Triage Notes (Signed)
Patient has 2 home pregnancy tests with her, reporting 2 positive tests.   Patient having abdominal pain.  Low abdominal pain, started this morning.  No pain with urination

## 2018-09-01 ENCOUNTER — Telehealth (HOSPITAL_COMMUNITY): Payer: Self-pay | Admitting: Emergency Medicine

## 2018-09-01 LAB — URINE CULTURE

## 2018-09-01 NOTE — Telephone Encounter (Signed)
Urine culture was positive for ESCHERICHIA COLI and was given keflex  at urgent care visit. Pt contacted and made aware, educated on completing antibiotic and to follow up if symptoms are persistent. Verbalized understanding.    

## 2018-09-11 ENCOUNTER — Ambulatory Visit: Payer: Medicaid Other | Admitting: Nurse Practitioner

## 2018-10-01 ENCOUNTER — Encounter (HOSPITAL_COMMUNITY): Payer: Self-pay | Admitting: Emergency Medicine

## 2018-10-01 ENCOUNTER — Other Ambulatory Visit: Payer: Self-pay

## 2018-10-01 ENCOUNTER — Inpatient Hospital Stay (HOSPITAL_COMMUNITY)
Admission: EM | Admit: 2018-10-01 | Discharge: 2018-10-01 | Disposition: A | Discharge status: Other Acute Inpatient Hospital | Payer: Medicaid Other | Attending: Family Medicine | Admitting: Family Medicine

## 2018-10-01 ENCOUNTER — Emergency Department (HOSPITAL_COMMUNITY): Payer: Medicaid Other

## 2018-10-01 DIAGNOSIS — Z3A09 9 weeks gestation of pregnancy: Secondary | ICD-10-CM | POA: Diagnosis not present

## 2018-10-01 DIAGNOSIS — Z3A00 Weeks of gestation of pregnancy not specified: Secondary | ICD-10-CM | POA: Insufficient documentation

## 2018-10-01 DIAGNOSIS — N12 Tubulo-interstitial nephritis, not specified as acute or chronic: Secondary | ICD-10-CM

## 2018-10-01 DIAGNOSIS — O99511 Diseases of the respiratory system complicating pregnancy, first trimester: Secondary | ICD-10-CM | POA: Diagnosis not present

## 2018-10-01 DIAGNOSIS — N10 Acute tubulo-interstitial nephritis: Secondary | ICD-10-CM | POA: Insufficient documentation

## 2018-10-01 DIAGNOSIS — Z3491 Encounter for supervision of normal pregnancy, unspecified, first trimester: Secondary | ICD-10-CM

## 2018-10-01 DIAGNOSIS — J45909 Unspecified asthma, uncomplicated: Secondary | ICD-10-CM | POA: Diagnosis not present

## 2018-10-01 DIAGNOSIS — O2301 Infections of kidney in pregnancy, first trimester: Secondary | ICD-10-CM | POA: Insufficient documentation

## 2018-10-01 DIAGNOSIS — R109 Unspecified abdominal pain: Secondary | ICD-10-CM

## 2018-10-01 DIAGNOSIS — O9989 Other specified diseases and conditions complicating pregnancy, childbirth and the puerperium: Secondary | ICD-10-CM | POA: Diagnosis present

## 2018-10-01 HISTORY — DX: Calculus of kidney: N20.0

## 2018-10-01 LAB — CBC WITH DIFFERENTIAL/PLATELET
Abs Immature Granulocytes: 0.03 10*3/uL (ref 0.00–0.07)
Basophils Absolute: 0 10*3/uL (ref 0.0–0.1)
Basophils Relative: 0 %
Eosinophils Absolute: 0 10*3/uL (ref 0.0–0.5)
Eosinophils Relative: 0 %
HCT: 40.4 % (ref 36.0–46.0)
Hemoglobin: 13 g/dL (ref 12.0–15.0)
Immature Granulocytes: 0 %
Lymphocytes Relative: 13 %
Lymphs Abs: 0.9 10*3/uL (ref 0.7–4.0)
MCH: 28.2 pg (ref 26.0–34.0)
MCHC: 32.2 g/dL (ref 30.0–36.0)
MCV: 87.6 fL (ref 80.0–100.0)
Monocytes Absolute: 1.1 10*3/uL — ABNORMAL HIGH (ref 0.1–1.0)
Monocytes Relative: 15 %
Neutro Abs: 5.2 10*3/uL (ref 1.7–7.7)
Neutrophils Relative %: 72 %
Platelets: 209 10*3/uL (ref 150–400)
RBC: 4.61 MIL/uL (ref 3.87–5.11)
RDW: 14 % (ref 11.5–15.5)
WBC: 7.4 10*3/uL (ref 4.0–10.5)
nRBC: 0 % (ref 0.0–0.2)

## 2018-10-01 LAB — URINALYSIS, ROUTINE W REFLEX MICROSCOPIC
Bilirubin Urine: NEGATIVE
Glucose, UA: NEGATIVE mg/dL
Hgb urine dipstick: NEGATIVE
Ketones, ur: NEGATIVE mg/dL
Nitrite: POSITIVE — AB
Protein, ur: NEGATIVE mg/dL
Specific Gravity, Urine: 1.015 (ref 1.005–1.030)
pH: 8 (ref 5.0–8.0)

## 2018-10-01 LAB — COMPREHENSIVE METABOLIC PANEL
ALT: 28 U/L (ref 0–44)
AST: 19 U/L (ref 15–41)
Albumin: 3.2 g/dL — ABNORMAL LOW (ref 3.5–5.0)
Alkaline Phosphatase: 40 U/L (ref 38–126)
Anion gap: 10 (ref 5–15)
BUN: 7 mg/dL (ref 6–20)
CO2: 22 mmol/L (ref 22–32)
Calcium: 9.1 mg/dL (ref 8.9–10.3)
Chloride: 104 mmol/L (ref 98–111)
Creatinine, Ser: 0.68 mg/dL (ref 0.44–1.00)
GFR calc Af Amer: >60 mL/min (ref >60)
GFR calc non Af Amer: >60 mL/min (ref >60)
Glucose, Bld: 81 mg/dL (ref 70–99)
Potassium: 3.7 mmol/L (ref 3.5–5.1)
Sodium: 136 mmol/L (ref 135–145)
Total Bilirubin: 0.5 mg/dL (ref 0.3–1.2)
Total Protein: 7.1 g/dL (ref 6.5–8.1)

## 2018-10-01 LAB — HCG, QUANTITATIVE, PREGNANCY: hCG, Beta Chain, Quant, S: 75210 m[IU]/mL — ABNORMAL HIGH (ref <5)

## 2018-10-01 LAB — ABO/RH: ABO/RH(D): AB POS

## 2018-10-01 LAB — LIPASE, BLOOD: Lipase: 28 U/L (ref 11–51)

## 2018-10-01 MED ORDER — SODIUM CHLORIDE 0.9 % IV SOLN
1.0000 g | Freq: Once | INTRAVENOUS | Status: AC
  Administered 2018-10-01: 18:00:00 1 g via INTRAVENOUS
  Filled 2018-10-01: qty 10

## 2018-10-01 MED ORDER — MORPHINE SULFATE (PF) 4 MG/ML IV SOLN
4.0000 mg | Freq: Once | INTRAVENOUS | Status: AC
  Administered 2018-10-01: 4 mg via INTRAVENOUS
  Filled 2018-10-01: qty 1

## 2018-10-01 MED ORDER — CEPHALEXIN 500 MG PO CAPS
500.0000 mg | ORAL_CAPSULE | Freq: Once | ORAL | Status: AC
  Administered 2018-10-01: 500 mg via ORAL
  Filled 2018-10-01: qty 1

## 2018-10-01 MED ORDER — CEPHALEXIN 500 MG PO CAPS: 500.0000 mg | ORAL_CAPSULE | Freq: Three times a day (TID) | ORAL | 0 refills | Status: AC

## 2018-10-01 MED ORDER — SODIUM CHLORIDE 0.9 % IV SOLN
1000.0000 mL | INTRAVENOUS | Status: DC
  Administered 2018-10-01: 1000 mL via INTRAVENOUS

## 2018-10-01 MED ORDER — SODIUM CHLORIDE 0.9 % IV BOLUS (SEPSIS)
1000.0000 mL | Freq: Once | INTRAVENOUS | Status: AC
  Administered 2018-10-01: 1000 mL via INTRAVENOUS

## 2018-10-01 MED ORDER — ACETAMINOPHEN 500 MG PO TABS: 1000.0000 mg | ORAL_TABLET | Freq: Four times a day (QID) | ORAL | 0 refills | Status: AC | PRN

## 2018-10-01 MED ORDER — ACETAMINOPHEN 500 MG PO TABS
1000.0000 mg | ORAL_TABLET | Freq: Once | ORAL | Status: AC
  Administered 2018-10-01: 20:00:00 1000 mg via ORAL
  Filled 2018-10-01: qty 2

## 2018-10-01 NOTE — ED Notes (Signed)
Patient transported to [facility] 

## 2018-10-01 NOTE — ED Provider Notes (Signed)
[facility] Provider [facility]   CSN: [id number] Arrival date & time: 10/01/18  1402    History   Chief Complaint Chief Complaint  Patient presents with  . Flank Pain    HPI Karen Curry is a 23 y.o. female.     HPI Is estimated 2 months pregnant.  This is by dates.  She reports that she awakened this morning with a severe pain in her left flank area.  She felt fine when she went to bed yesterday.  She has not had fevers, chills, burning or urgency of urination leading up to this.  He does have a history of kidney stone.  Reports that was treated with medications and antibiotics.  Has been a number of years ago.  He is otherwise healthy. Past Medical History:  Diagnosis Date  . Asthma   . Kidney stones    23 yo  . UTI (urinary tract infection)     Patient Active Problem List   Diagnosis Date Noted  . Encounter for surveillance of implantable subdermal contraceptive 06/16/2015  . Acanthosis 06/16/2015  . Hirsutism 06/16/2015  . Obesity (BMI 30-39.9) 06/16/2015    Past Surgical History:  Procedure Laterality Date  . NO PAST SURGERIES       OB History    Gravida  2   Para  1   Term  1   Preterm      AB      Living  1     SAB      TAB      Ectopic      Multiple  0   Live Births  1            Home Medications    Prior to Admission medications   Not on File    Family History Family History  Problem Relation Age of Onset  . Hypertension Father     Social History Social History   Tobacco Use  . Smoking status: Never Smoker  . Smokeless tobacco: Never Used  Substance Use Topics  . Alcohol use: No    Alcohol/week: 0.0 standard drinks  . Drug use: No     Allergies   Patient has no known allergies.   Review of Systems Review of Systems 10 Systems reviewed and are negative for acute change except as noted in the HPI.   Physical Exam Updated Vital Signs BP 109/68   Pulse 96   Temp 98.2  F (36.8 C) (Oral)   Resp 16   LMP 07/29/2018   SpO2 99%   Physical Exam Constitutional:      Comments: Nontoxic.  Patient appears to be in pain and is tearful.  HENT:     Head: Normocephalic and atraumatic.     Mouth/Throat:     Pharynx: Oropharynx is clear.  Eyes:     Extraocular Movements: Extraocular movements intact.  Cardiovascular:     Rate and Rhythm: Normal rate and regular rhythm.  Pulmonary:     Effort: Pulmonary effort is normal.     Breath sounds: Normal breath sounds.  Abdominal:     Palpations: Abdomen is soft.     Comments: Mild left lower discomfort to palpation.  No guarding.  Positive severe left flank pain.  Musculoskeletal: Normal range of motion.        General: No swelling or tenderness.     Right lower leg: No edema.     Left lower leg: No edema.  Skin:  General: Skin is warm and dry.  Neurological:     General: No focal deficit present.     Mental Status: She is oriented to person, place, and time.     Coordination: Coordination normal.  Psychiatric:     Comments: Patient is tearful and anxious      ED Treatments / Results  Labs (all labs ordered are listed, but only abnormal results are displayed) Labs Reviewed  URINALYSIS, ROUTINE W REFLEX MICROSCOPIC - Abnormal; Notable for the following components:      Result Value   APPearance HAZY (*)    Nitrite POSITIVE (*)    Leukocytes,Ua TRACE (*)    Bacteria, UA RARE (*)    All other components within normal limits  COMPREHENSIVE METABOLIC PANEL - Abnormal; Notable for the following components:   Albumin 3.2 (*)    All other components within normal limits  CBC WITH DIFFERENTIAL/PLATELET - Abnormal; Notable for the following components:   Monocytes Absolute 1.1 (*)    All other components within normal limits  HCG, QUANTITATIVE, PREGNANCY - Abnormal; Notable for the following components:   hCG, Beta Chain, Quant, S 75,210 (*)    All other components within normal limits  URINE CULTURE   LIPASE, BLOOD  ABO/RH    EKG None  Radiology US Ob Comp < 14 Wks  Result Date: 10/01/2018 CLINICAL DATA:  Back and pelvic pain. Flank pain. Pregnant patient. EXAM: OBSTETRIC <14 WK ULTRASOUND TECHNIQUE: Transabdominal ultrasound was performed for evaluation of the gestation as well as the maternal uterus and adnexal regions. COMPARISON:  None. FINDINGS: Intrauterine gestational sac: Single Yolk sac:  Visualized. Embryo:  Visualized. Cardiac Activity: Visualized. Heart Rate: 178 bpm MSD:    mm    w     d CRL:   26 mm   9 w 3 d                  [place] EDC: May 03, 2019 Subchorionic hemorrhage:  None visualized. Maternal uterus/adnexae: The ovaries are normal with a corpus luteum cyst on the right. IMPRESSION: Single live IUP. No cause for pain identified. Corpus luteum cyst in the right ovary. Electronically Signed   By: Gerome Sam III M.D   On: 10/01/2018 16:17   US Renal  Result Date: 10/01/2018 CLINICAL DATA:  Flank pain, back pain for 1 day EXAM: RENAL / URINARY TRACT ULTRASOUND COMPLETE COMPARISON:  None. FINDINGS: Right Kidney: Renal measurements: 11 x 6.2 x 6 cm = volume: 214 mL . Echogenicity within normal limits. No mass or hydronephrosis visualized. Two anechoic right renal masses with increased through transmission measuring 2.8 x 2 x 1.8 cm (upper pole) and 0.8 x 0.7 x 0.9 cm (midpole) respectively. Left Kidney: Renal measurements: 11.3 x 5.7 x 5.2 cm = volume: 178 mL. Echogenicity within normal limits. No mass visualized. Mild left hydronephrosis. Bladder: Appears normal for degree of bladder distention. IMPRESSION: 1. Mild left hydronephrosis. 2. Right renal cysts. Electronically Signed   By: Elige Ko   On: 10/01/2018 16:14    Procedures Procedures (including critical care time)  Medications Ordered in ED Medications  sodium chloride 0.9 % bolus 1,000 mL (0 mLs Intravenous Stopped 10/01/18 1740)    Followed by  0.9 %  sodium chloride infusion (1,000 mLs Intravenous New  Bag/Given 10/01/18 1751)  morphine 4 MG/ML injection 4 mg (has no administration in time range)  morphine 4 MG/ML injection 4 mg (4 mg Intravenous Given 10/01/18 1518)  cefTRIAXone (ROCEPHIN) 1  g in sodium chloride 0.9 % 100 mL IVPB (1 g Intravenous New Bag/Given 10/01/18 1752)     Initial Impression / Assessment and Plan / ED Course  I have reviewed the triage vital signs and the nursing notes.  Pertinent labs & imaging results that were available during my care of the patient were reviewed by me and considered in my medical decision making (see chart for details).       Consultation: Reviewed with GYN attending at [facility].  Advised to transfer patient for further assessment for pyelonephritis with possible kidney stone.  Patient is in first trimester of her pregnancy.  She had sudden onset of flank pain.  Urinalysis grossly positive.  Ultrasound shows mild left hydronephrosis.  Patient may have kidney stone versus pyelonephritis without stone.  US shows IUP without ectopic.  It is not showing any signs of sepsis at this time.  She is started on Rocephin and pain control with morphine.  Final Clinical Impressions(s) / ED Diagnoses   Final diagnoses:  Pyelonephritis  Flank pain  First trimester pregnancy    ED Discharge Orders    None       Arby Barrette, MD 10/01/18 [phone]

## 2018-10-01 NOTE — ED Notes (Addendum)
Pt aware a urine sample will be needed, states she just went but will provide one when able.

## 2018-10-01 NOTE — Discharge Instructions (Signed)
Pregnancy and Urinary Tract Infection What is a urinary tract infection?  A urinary tract infection (UTI) is an infection of any part of the urinary tract. This includes the kidneys, the tubes that connect your kidneys to your bladder (ureters), the bladder, and the tube that carries urine out of your body (urethra). These organs make, store, and get rid of urine in the body.  An upper UTI affects the ureters and kidneys (pyelonephritis), and a lower UTI affects the bladder (cystitis) and urethra (urethritis). Most urinary tract infections are caused by bacteria in your genital area, around the entrance to your urinary tract (urethra). These bacteria grow and cause irritation and inflammation of your urinary tract. Why am I more likely to get a UTI during pregnancy? You are more likely to develop a UTI during pregnancy because:  The physical and hormonal changes your body goes through can make it easier for bacteria to get into your urinary tract.  Your growing baby puts pressure on your uterus and can affect urine flow. Does a UTI place my baby at risk? An untreated UTI during pregnancy could lead to a kidney infection, which can cause health problems that could affect your baby. Possible complications of an untreated UTI include:  Having your baby before 37 weeks of pregnancy (premature).  Having a baby with a low birth weight.  Developing high blood pressure during pregnancy (preeclampsia).  Having a low hemoglobin level (anemia). What are the symptoms of a UTI? Symptoms of a UTI include:  Needing to urinate right away (urgently).  Frequent urination or passing small amounts of urine frequently.  Pain or burning with urination.  Blood in the urine.  Urine that smells bad or unusual.  Trouble urinating.  Cloudy urine.  Pain in the abdomen or lower back.  Vaginal discharge. You may also have:  Vomiting or a decreased appetite.  Confusion.  Irritability or  tiredness.  A fever.  Diarrhea. What are the treatment options for a UTI during pregnancy? Treatment for this condition may include:  Antibiotic medicines that are safe to take during pregnancy.  Other medicines to treat less common causes of UTI. How can I prevent a UTI? To prevent a UTI:  Go to the bathroom as soon as you feel the need. Do not hold urine for long periods of time.  Always wipe from front to back after a bowel movement. Use each tissue one time when you wipe.  Empty your bladder after sex.  Keep your genital area dry.  Drink 6-10 glasses of water each day.  Do not douche or use deodorant sprays. Contact a health care provider if:  Your symptoms do not improve or they get worse.  You have abnormal vaginal discharge. Get help right away if:  You have a fever.  You have nausea and vomiting.  You have back or side pain.  You feel contractions in your uterus.  You have lower belly pain.  You have a gush of fluid from your vagina.  You have blood in your urine. Summary  A urinary tract infection (UTI) is an infection of any part of the urinary tract, which includes the kidneys, ureters, bladder, and urethra.  Most urinary tract infections are caused by bacteria in your genital area, around the entrance to your urinary tract (urethra).  You are more likely to develop a UTI during pregnancy.  If you were prescribed an antibiotic, take it as told by your health care provider. Do not stop taking the  antibiotic even if you start to feel better. This information is not intended to replace advice given to you by your health care provider. Make sure you discuss any questions you have with your health care provider. Document Released: 09/11/2010 Document Revised: 07/12/2017 Document Reviewed: 04/07/2015 Elsevier Interactive Patient Education  2019 [employer] Pyelonephritis During Pregnancy  What are the causes? This condition is caused by a bacterial  infection in the lower urinary tract that spreads to the kidney. What increases the risk? You are more likely to develop this condition if:  You have diabetes.  You have a history of frequent urinary tract infections. What are the signs or symptoms? Symptoms of this condition may begin with symptoms of a lower urinary tract infection. These may include:  A frequent urge to pass urine.  Burning pain when passing urine.  Pain and pressure in your lower abdomen.  Blood in your urine.  Cloudy or smelly urine. As the infection spreads to your kidney, you may have these symptoms:  Fever.  Chills.  Pain and tenderness in your upper abdomen or in your back and sides (flank pain). Flank pain often affects one side of the body, usually the right side.  Nausea, vomiting, or loss of appetite. How is this diagnosed? This condition may be diagnosed based on:  Your symptoms and medical history.  A physical exam.  Tests to confirm the diagnosis. These may include: ? Blood tests to check kidney function and look for signs of infection. ? Urine tests to check for signs of infection, including bacteria, white or red blood cells, and protein. ? Tests to grow and identify the type of bacteria that is causing the infection (urine culture). ? Imaging studies of your kidneys to learn more about your condition. How is this treated? This condition is treated in the hospital with antibiotics that are given into one of your veins through an IV. Your health care provider:  Will start you on an antibiotic that is effective against common urinary tract infections.  May switch to another antibiotic if the results of your urine culture show that your infection is caused by different bacteria.  Will be careful to choose antibiotics that are the safest during pregnancy. Other treatments may include:  IV fluids if you are nauseous and not able to drink fluids.  Pain and fever medicines.  Medicines  for nausea and vomiting. You will be able to go home when your infection is under control. To prevent another infection, you may need to continue taking antibiotics by mouth until your baby is born. Follow these instructions at home: Medicines   Take over-the-counter and prescription medicines only as told by your health care provider.  Take your antibiotic medicine as told by your health care provider. Do not stop taking the antibiotic even if you start to feel better.  Continue to take your prenatal vitamins. Lifestyle   Follow instructions from your health care provider about eating or drinking restrictions. You may need to avoid: ? Foods and drinks with added sugar. ? Caffeine and fruit juice.  Drink enough fluid to keep your urine pale yellow.  Go to the bathroom frequently. Do not hold your urine. Try to empty your bladder completely.  Change your underwear every day. Wear all-cotton underwear. Do not wear tight underwear or pants. General instructions   Take these steps to lower the risk of bacteria getting into your urinary tract: ? Use liquid soap instead of bar soap when showering or bathing.  Bacteria can grow on bar soap. ? When you wash yourself, clean the urethra opening first. Use a washcloth to clean the area between your vagina and anus. Pat the area dry with a clean towel. ? Wash your hands before and after you go to the bathroom. ? Wipe yourself from front to back after going to the bathroom. ? Do not use douches, perfumed soap, creams, or powders. ? Do not soak in a bath for more than 30 minutes.  Return to your normal activities as told by your health care provider. Ask your health care provider what activities are safe for you.  Keep all follow-up visits as told by your health care provider. This is important. Contact a health care provider if:  You have chills or a fever.  You have any symptoms of infection that do not get better at home.  Symptoms of  infection come back.  You have a reaction or side effects from your antibiotic. Get help right away if:  You start having contractions. Summary  Pyelonephritis is an infection of the kidney or kidneys.  This condition results when a bacterial infection in the lower urinary tract spreads to the kidney.  Lower urinary tract infections are common during pregnancy.  Pyelonephritis causes chills, a fever, flank pain, and nausea.  Pyelonephritis is a serious infection that is usually treated in the hospital with IV antibiotics. This information is not intended to replace advice given to you by your health care provider. Make sure you discuss any questions you have with your health care provider. Document Released: 08/18/2017 Document Revised: 08/18/2017 Document Reviewed: 08/18/2017 Elsevier Interactive Patient Education  2019 [employer]

## 2018-10-01 NOTE — ED Triage Notes (Signed)
Pt reports left side flank pain since this morning, states it feels like when she's had kidney stones in the past.  Pt is 2 months pregnant.

## 2018-10-01 NOTE — MAU Provider Note (Signed)
History     CSN: [id number]  Arrival date and time: 10/01/18 1402   First Provider Initiated Contact with Patient 10/01/18 1921      Chief Complaint  Patient presents with  . Flank Pain   [name] is a 23 y.o. G2P1001 at [redacted]w[redacted]d who was transferred from the ED for possible pyelonephritis after a complete work-up. Pt reports she presented to the ED this afternoon with left-sided flank pain. Of note, pt was dx/tx for a UTI at the end of March. Pt reports taking the ABX as prescribed, but states that she doesn't think the UTI ever went away because her urine continued to have a "funny smell." Pt did not have a TOC.  Onset: 0400 this morning Location: left flank Duration: <24hrs Character: progressively worsening pain since this morning, with peak of pain around 1600 after admission to ED, sharp, stabbing, pulsing Aggravating/Associated: deep breathing, standing, twisting motion/none Relieving: none Treatment: nothing at home, but was given IV morphine  x2 in ED with 1g IV Rocephin Severity: at worst pain 10/10  Pt denies VB, vaginal discharge/odor/itching. Pt denies N/V, abdominal pain, constipation, diarrhea, or urinary problems. Pt denies fever, chills, fatigue, sweating or changes in appetite. Pt denies SOB or chest pain. Pt denies dizziness, HA, light-headedness, weakness.  Problems this pregnancy include: none. Allergies? NKDA Current medications/supplements? none Prenatal care provider? None - would like to be seen at [facility]   OB History    Gravida  2   Para  1   Term  1   Preterm      AB      Living  1     SAB      TAB      Ectopic      Multiple  0   Live Births  1           Past Medical History:  Diagnosis Date  . Asthma   . Kidney stones    23 yo  . UTI (urinary tract infection)     Past Surgical History:  Procedure Laterality Date  . NO PAST SURGERIES      Family History  Problem Relation Age of Onset  . Hypertension  Father     Social History   Tobacco Use  . Smoking status: Never Smoker  . Smokeless tobacco: Never Used  Substance Use Topics  . Alcohol use: No    Alcohol/week: 0.0 standard drinks  . Drug use: No    Allergies: No Known Allergies  No medications prior to admission.    Review of Systems  Constitutional: Negative for appetite change, chills, diaphoresis, fatigue and fever.  Respiratory: Negative for shortness of breath.   Cardiovascular: Negative for chest pain.  Gastrointestinal: Negative for abdominal pain, constipation, diarrhea, nausea and vomiting.  Genitourinary: Positive for flank pain. Negative for dysuria, frequency, pelvic pain, urgency, vaginal bleeding and vaginal discharge.  Neurological: Negative for dizziness, weakness, light-headedness and headaches.   Physical Exam   Blood pressure 122/66, pulse 100, temperature 99.8 F (37.7 C), temperature source Oral, resp. rate 16, last menstrual period 07/29/2018, SpO2 100 %, unknown if currently breastfeeding.  Patient Vitals for the past 24 hrs:  BP Temp Temp src Pulse Resp SpO2  10/01/18 2046 - 99.8 F (37.7 C) Oral - - -  10/01/18 1921 122/66 (!) 102.9 F (39.4 C) Oral 100 16 100 %  10/01/18 1845 107/67 - - (!) 104 - 99 %  10/01/18 1815 109/71 - - Marland Kitchen)  103 - 100 %  10/01/18 1800 105/71 - - 99 - 100 %  10/01/18 1749 109/68 - - 96 16 99 %  10/01/18 1430 114/60 - - 100 - 99 %  10/01/18 1416 116/75 98.2 F (36.8 C) Oral (!) 104 16 99 %  10/01/18 1415 116/75 - - (!) 102 - 99 %   Physical Exam  Constitutional: She is oriented to person, place, and time. She appears well-developed and well-nourished. No distress.  HENT:  Head: Normocephalic and atraumatic.  Respiratory: Effort normal.  GI: Soft. She exhibits no distension and no mass. There is no abdominal tenderness. There is CVA tenderness (mild tenderness, left side). There is no rebound and no guarding.  Neurological: She is alert and oriented to person,  place, and time.  Skin: Skin is warm and dry. She is not diaphoretic.  Psychiatric: She has a normal mood and affect. Her behavior is normal. Judgment and thought content normal.   Results for orders placed or performed during the hospital encounter of 10/01/18 (from the past 24 hour(s))  Urinalysis, Routine w reflex microscopic     Status: Abnormal   Collection Time: 10/01/18  3:19 PM  Result Value Ref Range   Color, Urine YELLOW YELLOW   APPearance HAZY (A) CLEAR   Specific Gravity, Urine 1.015 1.005 - 1.030   pH 8.0 5.0 - 8.0   Glucose, UA NEGATIVE NEGATIVE mg/dL   Hgb urine dipstick NEGATIVE NEGATIVE   Bilirubin Urine NEGATIVE NEGATIVE   Ketones, ur NEGATIVE NEGATIVE mg/dL   Protein, ur NEGATIVE NEGATIVE mg/dL   Nitrite POSITIVE (A) NEGATIVE   Leukocytes,Ua TRACE (A) NEGATIVE   RBC / HPF 0-5 0 - 5 RBC/hpf   WBC, UA 11-20 0 - 5 WBC/hpf   Bacteria, UA RARE (A) NONE SEEN   Squamous Epithelial / LPF 0-5 0 - 5   Mucus PRESENT   Comprehensive metabolic panel     Status: Abnormal   Collection Time: 10/01/18  3:19 PM  Result Value Ref Range   Sodium 136 135 - 145 mmol/L   Potassium 3.7 3.5 - 5.1 mmol/L   Chloride 104 98 - 111 mmol/L   CO2 22 22 - 32 mmol/L   Glucose, Bld 81 70 - 99 mg/dL   BUN 7 6 - 20 mg/dL   Creatinine, Ser 7.820.68 0.44 - 1.00 mg/dL   Calcium 9.1 8.9 - 95.610.3 mg/dL   Total Protein 7.1 6.5 - 8.1 g/dL   Albumin 3.2 (L) 3.5 - 5.0 g/dL   AST 19 15 - 41 U/L   ALT 28 0 - 44 U/L   Alkaline Phosphatase 40 38 - 126 U/L   Total Bilirubin 0.5 0.3 - 1.2 mg/dL   GFR calc non Af Amer >60 >60 mL/min   GFR calc Af Amer >60 >60 mL/min   Anion gap 10 5 - 15  Lipase, blood     Status: None   Collection Time: 10/01/18  3:19 PM  Result Value Ref Range   Lipase 28 11 - 51 U/L  CBC with Differential     Status: Abnormal   Collection Time: 10/01/18  3:19 PM  Result Value Ref Range   WBC 7.4 4.0 - 10.5 K/uL   RBC 4.61 3.87 - 5.11 MIL/uL   Hemoglobin 13.0 12.0 - 15.0 g/dL   HCT  21.340.4 08.636.0 - 57.846.0 %   MCV 87.6 80.0 - 100.0 fL   MCH 28.2 26.0 - 34.0 pg   MCHC 32.2 30.0 - 36.0  g/dL   RDW 16.1 09.6 - 04.5 %   Platelets 209 150 - 400 K/uL   nRBC 0.0 0.0 - 0.2 %   Neutrophils Relative % 72 %   Neutro Abs 5.2 1.7 - 7.7 K/uL   Lymphocytes Relative 13 %   Lymphs Abs 0.9 0.7 - 4.0 K/uL   Monocytes Relative 15 %   Monocytes Absolute 1.1 (H) 0.1 - 1.0 K/uL   Eosinophils Relative 0 %   Eosinophils Absolute 0.0 0.0 - 0.5 K/uL   Basophils Relative 0 %   Basophils Absolute 0.0 0.0 - 0.1 K/uL   Immature Granulocytes 0 %   Abs Immature Granulocytes 0.03 0.00 - 0.07 K/uL  hCG, quantitative, pregnancy     Status: Abnormal   Collection Time: 10/01/18  3:19 PM  Result Value Ref Range   hCG, Beta Chain, Quant, S 75,210 (H) <5 mIU/mL  ABO/Rh     Status: None   Collection Time: 10/01/18  3:21 PM  Result Value Ref Range   ABO/RH(D) AB POS    No rh immune globuloin      NOT A RH IMMUNE GLOBULIN CANDIDATE, PT RH POSITIVE Performed at [facility], [address] [place], [place] [address]    US Ob Comp < 14 Wks  Result Date: 10/01/2018 CLINICAL DATA:  Back and pelvic pain. Flank pain. Pregnant patient. EXAM: OBSTETRIC <14 WK ULTRASOUND TECHNIQUE: Transabdominal ultrasound was performed for evaluation of the gestation as well as the maternal uterus and adnexal regions. COMPARISON:  None. FINDINGS: Intrauterine gestational sac: Single Yolk sac:  Visualized. Embryo:  Visualized. Cardiac Activity: Visualized. Heart Rate: 178 bpm MSD:    mm    w     d CRL:   26 mm   9 w 3 d                  [place] EDC: May 03, 2019 Subchorionic hemorrhage:  None visualized. Maternal uterus/adnexae: The ovaries are normal with a corpus luteum cyst on the right. IMPRESSION: Single live IUP. No cause for pain identified. Corpus luteum cyst in the right ovary. Electronically Signed   By: Gerome Sam III M.D   On: 10/01/2018 16:17   US Renal  Result Date: 10/01/2018 CLINICAL DATA:  Flank pain, back  pain for 1 day EXAM: RENAL / URINARY TRACT ULTRASOUND COMPLETE COMPARISON:  None. FINDINGS: Right Kidney: Renal measurements: 11 x 6.2 x 6 cm = volume: 214 mL . Echogenicity within normal limits. No mass or hydronephrosis visualized. Two anechoic right renal masses with increased through transmission measuring 2.8 x 2 x 1.8 cm (upper pole) and 0.8 x 0.7 x 0.9 cm (midpole) respectively. Left Kidney: Renal measurements: 11.3 x 5.7 x 5.2 cm = volume: 178 mL. Echogenicity within normal limits. No mass visualized. Mild left hydronephrosis. Bladder: Appears normal for degree of bladder distention. IMPRESSION: 1. Mild left hydronephrosis. 2. Right renal cysts. Electronically Signed   By: Elige Ko   On: 10/01/2018 16:14   MAU Course  Procedures  MDM -ED transfer for possible pyelonephritis -ED Labs:       -UA: hazy/+nitrites/trace leuks/rare bacteria, sent for culture by ED       -albumin 3.2, otherwise WNL (creatinine 0.68)       -lipase: WNL       -CBC: WBCs 7.4, monocytes mildly elevated .1, otherwise WNL       -hCG: 75,210       -ABO: AB Positive -ED Imaging:       -  Renal US: mild left hydronephrosis, right renal cysts       -Pelvic US: single IUP, [redacted]w[redacted]d, right CL -ED Treatment:       -NaCl bolus + infusion       -morphine 4mg  x2       -1g rocephin -Consulted with Dr. Alysia Penna (after pt was medically screened) @1930 . Per Dr. Alysia Penna, if pain continues to be relieved, temperature goes down, and no N/V develops, pt to be discharged home with PO Keflex 500TID x10d with Tylenol for fever and f/u in office in 1wk. -Tylenol given @1945 ; temp @2045  99.8 -single dose of Keflex 500mg  given PO in MAU to assess tolerability, PO challenge successful, pt also able to tolerate PO food/fluids -pt reports pain has been significantly relieved and now is only present with deep inspiration, pt states she "feels so much better now" -pt discharged to home in stable condition  Assessment and Plan   1.  Pyelonephritis   2. Flank pain   3. First trimester pregnancy   4. [redacted] weeks gestation of pregnancy    Allergies as of 10/01/2018   No Known Allergies     Medication List    TAKE these medications   acetaminophen 500 MG tablet Commonly known as:  TYLENOL Take 2 tablets (1,000 mg total) by mouth every 6 (six) hours as needed for up to 5 days.   cephALEXin 500 MG capsule Commonly known as:  KEFLEX Take 1 capsule (500 mg total) by mouth 3 (three) times daily for 10 days.      -will call with urine culture results if requiring change in ABX -discussed s/sx of pyelonephritis -strict return precautions given for worsening symptoms -discussed other OB-related reasons to return to MAU -message sent to [facility] to schedule appt for pt in 1wk for f/u, pt aware to call on Wednesday if she does not receive a call from the office before then -RX for Tylenol sent at patient request -pt discharged to home in stable condition  Joni Reining E Nugent 10/01/2018, 9:07 PM

## 2018-10-03 LAB — URINE CULTURE: Culture: >=100000 — AB

## 2018-10-17 ENCOUNTER — Encounter: Payer: Self-pay | Admitting: General Practice

## 2018-10-17 ENCOUNTER — Ambulatory Visit: Payer: Medicaid Other

## 2018-10-17 ENCOUNTER — Encounter: Payer: Self-pay | Admitting: Family Medicine

## 2018-10-17 ENCOUNTER — Other Ambulatory Visit: Payer: Self-pay

## 2018-10-17 ENCOUNTER — Telehealth (INDEPENDENT_AMBULATORY_CARE_PROVIDER_SITE_OTHER): Payer: Medicaid Other | Admitting: General Practice

## 2018-10-17 DIAGNOSIS — Z3490 Encounter for supervision of normal pregnancy, unspecified, unspecified trimester: Secondary | ICD-10-CM | POA: Insufficient documentation

## 2018-10-17 DIAGNOSIS — R8290 Unspecified abnormal findings in urine: Secondary | ICD-10-CM

## 2018-10-17 DIAGNOSIS — Z3491 Encounter for supervision of normal pregnancy, unspecified, first trimester: Secondary | ICD-10-CM

## 2018-10-17 DIAGNOSIS — Z87898 Personal history of other specified conditions: Secondary | ICD-10-CM | POA: Insufficient documentation

## 2018-10-17 LAB — POCT URINALYSIS DIP (DEVICE)
Bilirubin Urine: NEGATIVE
Glucose, UA: NEGATIVE mg/dL
Hgb urine dipstick: NEGATIVE
Nitrite: POSITIVE — AB
Protein, ur: 30 mg/dL — AB
Specific Gravity, Urine: 1.025 (ref 1.005–1.030)
Urobilinogen, UA: 1 mg/dL (ref 0.0–1.0)
pH: 6.5 (ref 5.0–8.0)

## 2018-10-17 MED ORDER — PRENATAL 19 29-1 MG PO CHEW: 1.0000 | CHEWABLE_TABLET | Freq: Every day | ORAL | 11 refills | Status: DC

## 2018-10-17 MED ORDER — AMBULATORY NON FORMULARY MEDICATION: 1.0000 | Freq: Once | 0 refills | Status: AC

## 2018-10-17 NOTE — Progress Notes (Signed)
Pt here today for questionable UTI.  Pt reports that she is having an odor in her vaginal area when she goes to the bathroom only.  Pt states that she was taking an antibiotic for a UTI that she just finished a couple of days ago.  However she is still having the same smell.  Pt denies any vaginal discharge or burning with urination.  I informed pt that will send her urine for culture and if it comes back that there is something that we need to treat we will call her.  Pt states understanding.

## 2018-10-17 NOTE — Progress Notes (Signed)
I connected with  Karen Curry on 10/17/18 at  9:15 AM EDT by mychart video visit and verified that I am speaking with the correct person using two identifiers.   I discussed the limitations, risks, security and privacy concerns of performing an evaluation and management service by telephone and the availability of in person appointments. I also discussed with the patient that there may be a patient responsible charge related to this service. The patient expressed understanding and agreed to proceed.  New OB intake completed today. Patient reports she had some labs drawn & an exam at [facility]- discussed receiving records prior to next appt. Patient was recently treated for pyelo on 5/3 and completed antibiotics. Patient reports within the past few days she has had a fever, fatigue, noticed an odor again to her urine, & pain in her side- patient will come into the office this afternoon for UA. PNV Rx sent to pharmacy. Scheduled anatomy U/S 7/10. Babyscripts App info given. BP cuff to be mailed to patient via pharmacy- instructions given. Patient will return to office for new OB with Luna Kitchens on 6/2.  Marylynn Pearson, RN 10/17/2018  9:33 AM

## 2018-10-18 NOTE — Progress Notes (Signed)
Chart reviewed for nurse visit. Agree with plan of care.   Yesena Reaves Lorraine, CNM 10/18/2018 4:14 PM   

## 2018-10-20 LAB — URINE CULTURE, OB REFLEX

## 2018-10-20 LAB — CULTURE, OB URINE

## 2018-10-24 ENCOUNTER — Other Ambulatory Visit: Payer: Self-pay | Admitting: Obstetrics & Gynecology

## 2018-10-24 MED ORDER — NITROFURANTOIN MONOHYD MACRO 100 MG PO CAPS: 100.0000 mg | ORAL_CAPSULE | Freq: Two times a day (BID) | ORAL | 1 refills | Status: DC

## 2018-10-24 NOTE — Progress Notes (Signed)
macrobid prescribed for Ecoli UTI 

## 2018-10-31 ENCOUNTER — Other Ambulatory Visit: Payer: Self-pay

## 2018-10-31 ENCOUNTER — Ambulatory Visit (INDEPENDENT_AMBULATORY_CARE_PROVIDER_SITE_OTHER): Payer: Medicaid Other | Admitting: Student

## 2018-10-31 VITALS — BP 109/72 | HR 87 | Temp 98.0°F | Wt 204.0 lb

## 2018-10-31 DIAGNOSIS — Z3A13 13 weeks gestation of pregnancy: Secondary | ICD-10-CM

## 2018-10-31 DIAGNOSIS — Z3492 Encounter for supervision of normal pregnancy, unspecified, second trimester: Secondary | ICD-10-CM

## 2018-10-31 NOTE — Patient Instructions (Signed)
First Trimester of Pregnancy  The first trimester of pregnancy is from week 1 until the end of week 13 (months 1 through 3). During this time, your baby will begin to develop inside you. At 6-8 weeks, the eyes and face are formed, and the heartbeat can be seen on ultrasound. At the end of 12 weeks, all the baby's organs are formed. Prenatal care is all the medical care you receive before the birth of your baby. Make sure you get good prenatal care and follow all of your doctor's instructions. Follow these instructions at home: Medicines  Take over-the-counter and prescription medicines only as told by your doctor. Some medicines are safe and some medicines are not safe during pregnancy.  Take a prenatal vitamin that contains at least 600 micrograms (mcg) of folic acid.  If you have trouble pooping (constipation), take medicine that will make your stool soft (stool softener) if your doctor approves. Eating and drinking   Eat regular, healthy meals.  Your doctor will tell you the amount of weight gain that is right for you.  Avoid raw meat and uncooked cheese.  If you feel sick to your stomach (nauseous) or throw up (vomit): ? Eat 4 or 5 small meals a day instead of 3 large meals. ? Try eating a few soda crackers. ? Drink liquids between meals instead of during meals.  To prevent constipation: ? Eat foods that are high in fiber, like fresh fruits and vegetables, whole grains, and beans. ? Drink enough fluids to keep your pee (urine) clear or pale yellow. Activity  Exercise only as told by your doctor. Stop exercising if you have cramps or pain in your lower belly (abdomen) or low back.  Do not exercise if it is too hot, too humid, or if you are in a place of great height (high altitude).  Try to avoid standing for long periods of time. Move your legs often if you must stand in one place for a long time.  Avoid heavy lifting.  Wear low-heeled shoes. Sit and stand up straight.   You can have sex unless your doctor tells you not to. Relieving pain and discomfort  Wear a good support bra if your breasts are sore.  Take warm water baths (sitz baths) to soothe pain or discomfort caused by hemorrhoids. Use hemorrhoid cream if your doctor says it is okay.  Rest with your legs raised if you have leg cramps or low back pain.  If you have puffy, bulging veins (varicose veins) in your legs: ? Wear support hose or compression stockings as told by your doctor. ? Raise (elevate) your feet for 15 minutes, 3-4 times a day. ? Limit salt in your food. Prenatal care  Schedule your prenatal visits by the twelfth week of pregnancy.  Write down your questions. Take them to your prenatal visits.  Keep all your prenatal visits as told by your doctor. This is important. Safety  Wear your seat belt at all times when driving.  Make a list of emergency phone numbers. The list should include numbers for family, friends, the hospital, and police and fire departments. General instructions  Ask your doctor for a referral to a local prenatal class. Begin classes no later than at the start of month 6 of your pregnancy.  Ask for help if you need counseling or if you need help with nutrition. Your doctor can give you advice or tell you where to go for help.  Do not use hot tubs, steam   rooms, or saunas.  Do not douche or use tampons or scented sanitary pads.  Do not cross your legs for long periods of time.  Avoid all herbs and alcohol. Avoid drugs that are not approved by your doctor.  Do not use any tobacco products, including cigarettes, chewing tobacco, and electronic cigarettes. If you need help quitting, ask your doctor. You may get counseling or other support to help you quit.  Avoid cat litter boxes and soil used by cats. These carry germs that can cause birth defects in the baby and can cause a loss of your baby (miscarriage) or stillbirth.  Visit your dentist. At home,  brush your teeth with a soft toothbrush. Be gentle when you floss. Contact a doctor if:  You are dizzy.  You have mild cramps or pressure in your lower belly.  You have a nagging pain in your belly area.  You continue to feel sick to your stomach, you throw up, or you have watery poop (diarrhea).  You have a bad smelling fluid coming from your vagina.  You have pain when you pee (urinate).  You have increased puffiness (swelling) in your face, hands, legs, or ankles. Get help right away if:  You have a fever.  You are leaking fluid from your vagina.  You have spotting or bleeding from your vagina.  You have very bad belly cramping or pain.  You gain or lose weight rapidly.  You throw up blood. It may look like coffee grounds.  You are around people who have German measles, fifth disease, or chickenpox.  You have a very bad headache.  You have shortness of breath.  You have any kind of trauma, such as from a fall or a car accident. Summary  The first trimester of pregnancy is from week 1 until the end of week 13 (months 1 through 3).  To take care of yourself and your unborn baby, you will need to eat healthy meals, take medicines only if your doctor tells you to do so, and do activities that are safe for you and your baby.  Keep all follow-up visits as told by your doctor. This is important as your doctor will have to ensure that your baby is healthy and growing well. This information is not intended to replace advice given to you by your health care provider. Make sure you discuss any questions you have with your health care provider. Document Released: 11/03/2007 Document Revised: 05/25/2016 Document Reviewed: 05/25/2016 Elsevier Interactive Patient Education  2019 Elsevier Inc.  

## 2018-10-31 NOTE — Progress Notes (Deleted)
    Only odor now; no pain or burning now that she is using a different  -never took macrobid bc she never got a call -asymptomatic, feels like its related to soda cranberry  -

## 2018-10-31 NOTE — Progress Notes (Signed)
  Subjective:    Karen Curry is being seen today for her first obstetrical visit.  This is not a planned pregnancy but she is excited about it. . She is at [redacted]w[redacted]d gestation. Her obstetrical history is significant for UTI in early pregnancy. . Relationship with FOB: they live together when he is home, otherwise he is on the road driving trucks. . Patient does intend to breast feed. Pregnancy history fully reviewed.  Patient reports that she has a slight odor to her urine. She took her Keflex after her hospital visit on 10-01-2018; she got better but then started having symptoms again that she reported at her OB intake on 5-19. Repeat cultures showed that she stil had E.Coli. She was prescribed macrobid and but did not take it because she didn't get a phone call.  . She has not received her blood pressure cuff.  Review of Systems:   Review of Systems  Constitutional: Negative.   HENT: Negative.   Respiratory: Negative.   Cardiovascular: Negative.   Gastrointestinal: Negative.   Genitourinary: Negative.        Reports an "odor" with urination but no pain, dysuria, flannk pain, blood in urine.     Objective:     BP 109/72   Pulse 87   Temp 98 F (36.7 C)   Wt 204 lb (92.5 kg)   LMP 07/29/2018 (Exact Date)   BMI 36.14 kg/m  Physical Exam  Constitutional: She appears well-developed.  HENT:  Head: Normocephalic.  Neck: Normal range of motion.  Respiratory: Effort normal.  GI: Soft.  Musculoskeletal: Normal range of motion.  Neurological: She is alert.  Skin: Skin is warm and dry.  Psychiatric: She has a normal mood and affect.    Exam    Assessment:    Pregnancy: G2P1001 Patient Active Problem List   Diagnosis Date Noted  . Supervision of low-risk pregnancy 10/17/2018  . History of low birth weight 10/17/2018  . Acanthosis 06/16/2015  . Hirsutism 06/16/2015  . Obesity (BMI 30-39.9) 06/16/2015       Plan:     Initial labs drawn. Prenatal vitamins. Problem list  reviewed and updated. AFP3 discussed: needs at her 20 week visit. Role of ultrasound in pregnancy discussed; fetal survey: ordered. Amniocentesis discussed: not indicated. Follow up in 4 weeks. 50% of visit spent on counseling and coordination of care.  -Patient already have NOB labs and genetic testing at her NOB visit at [facility], will request records.  Patient left before signing ROI; she was called and will come back and sign this week -Pap normal per patient (from [facility]) -Will recollect urine culture in order to check for UTI; will order ABX as necessary.  -CMA confirmed that patient did have BabyRX App on her phone; she knows how to use it.  -CMA will call [facility] tomorrow about getting blood pressure cuff sent to patient.  -Patient desires outpatient circ.   Charlesetta Garibaldi [facility] 10/31/2018

## 2018-11-03 LAB — URINE CULTURE, OB REFLEX

## 2018-11-03 LAB — CULTURE, OB URINE

## 2018-11-06 ENCOUNTER — Other Ambulatory Visit: Payer: Self-pay | Admitting: Student

## 2018-11-06 DIAGNOSIS — N390 Urinary tract infection, site not specified: Secondary | ICD-10-CM | POA: Insufficient documentation

## 2018-11-06 DIAGNOSIS — N3000 Acute cystitis without hematuria: Secondary | ICD-10-CM

## 2018-11-06 MED ORDER — NITROFURANTOIN MONOHYD MACRO 100 MG PO CAPS: 100.0000 mg | ORAL_CAPSULE | Freq: Two times a day (BID) | ORAL | 0 refills | Status: DC

## 2018-11-08 ENCOUNTER — Telehealth: Payer: Self-pay

## 2018-11-08 NOTE — Telephone Encounter (Signed)
Pt called stating that she had a question about the medication that she was prescribed for UTI.  LM for pt that we are returning her call if she continues to have questions or concerns.  She can also respond via MyChart.

## 2018-11-28 ENCOUNTER — Telehealth: Payer: Self-pay | Admitting: *Deleted

## 2018-11-28 NOTE — Telephone Encounter (Signed)
Called pt regarding babyscripts.  Per review, pt has not logged a BP in the app since 10/31/18.  Pt states she does not have access to a BP cuff and has not received one.  Per chart review, bp cuff ordered 10/17/18.  [place] and left message with Altha Harm requesting they contact the clinic to discuss pt's BP cuff.

## 2018-12-08 ENCOUNTER — Other Ambulatory Visit (HOSPITAL_COMMUNITY): Payer: Medicaid Other

## 2018-12-24 ENCOUNTER — Other Ambulatory Visit: Payer: Self-pay

## 2018-12-24 ENCOUNTER — Emergency Department (HOSPITAL_COMMUNITY)
Admission: EM | Admit: 2018-12-24 | Discharge: 2018-12-24 | Disposition: A | Discharge status: Home / Self Care | Payer: Medicaid Other | Attending: Emergency Medicine | Admitting: Emergency Medicine

## 2018-12-24 ENCOUNTER — Encounter (HOSPITAL_COMMUNITY): Payer: Self-pay | Admitting: Emergency Medicine

## 2018-12-24 DIAGNOSIS — L0291 Cutaneous abscess, unspecified: Secondary | ICD-10-CM

## 2018-12-24 DIAGNOSIS — L02416 Cutaneous abscess of left lower limb: Secondary | ICD-10-CM | POA: Insufficient documentation

## 2018-12-24 DIAGNOSIS — J45909 Unspecified asthma, uncomplicated: Secondary | ICD-10-CM | POA: Insufficient documentation

## 2018-12-24 MED ORDER — CLINDAMYCIN HCL 150 MG PO CAPS: 300.0000 mg | ORAL_CAPSULE | Freq: Three times a day (TID) | ORAL | 0 refills | Status: AC

## 2018-12-24 MED ORDER — LIDOCAINE-EPINEPHRINE (PF) 2 %-1:200000 IJ SOLN
5.0000 mL | Freq: Once | INTRAMUSCULAR | Status: AC
  Administered 2018-12-24: 5 mL
  Filled 2018-12-24: qty 20

## 2018-12-24 NOTE — Discharge Instructions (Addendum)
Take antibiotics as prescribed.  Take entire course, even if your symptoms improve Use Tylenol as needed for pain. Use warm compresses to help with pain and swelling. Wash daily with soap and water.  Reapply new dressing each day. Follow-up with your primary care doctor in 3 days as needed for wound recheck. Return to the emergency room if you develop high fevers, severe worsening pain, or any new, worsening, or concerning symptoms.

## 2018-12-24 NOTE — ED Notes (Signed)
E-signature not available, verbalized understanding of DC instructions and Rx 

## 2018-12-24 NOTE — ED Provider Notes (Signed)
[facility] EMERGENCY DEPARTMENT Provider Note   CSN: [id number] Arrival date & time: 12/24/18  1545     History   Chief Complaint Chief Complaint  Patient presents with  . Abscess    HPI Karen Curry is a 23 y.o. female presenting for evaluation of abscess.  Patient states of the past 4 days, she has been having gradually increasing pain and swelling of her left lower leg.  It began when she felt a sting.  There was some drainage yesterday, but no drainage today.  She has not taken anything for it including Tylenol.  Pain is constant, worse with palpation.  Nothing makes it better.  She denies history of abscesses.  Patient states he is 5 months pregnant, not considered high risk.  She denies fevers, chills, nausea, vomiting.  She denies numbness or tingling.  She has no medical problems, takes no medications daily.  She is not on blood thinners.     HPI  Past Medical History:  Diagnosis Date  . Asthma   . Kidney stones    23 yo  . UTI (urinary tract infection)     Patient Active Problem List   Diagnosis Date Noted  . UTI (urinary tract infection) 11/06/2018  . Supervision of low-risk pregnancy 10/17/2018  . History of low birth weight 10/17/2018  . Acanthosis 06/16/2015  . Hirsutism 06/16/2015  . Obesity (BMI 30-39.9) 06/16/2015    Past Surgical History:  Procedure Laterality Date  . NO PAST SURGERIES       OB History    Gravida  2   Para  1   Term  1   Preterm  0   AB  0   Living  1     SAB  0   TAB  0   Ectopic  0   Multiple  0   Live Births  1            Home Medications    Prior to Admission medications   Medication Sig Start Date End Date Taking? Authorizing Provider  clindamycin (CLEOCIN) 150 MG capsule Take 2 capsules (300 mg total) by mouth 3 (three) times daily for 7 days. 12/24/18 12/31/18  Tamanika Heiney, PA-C  nitrofurantoin, macrocrystal-monohydrate, (MACROBID) 100 MG capsule Take 1 capsule (100 mg  total) by mouth 2 (two) times daily. 10/24/18   Emily Filbert, MD  nitrofurantoin, macrocrystal-monohydrate, (MACROBID) 100 MG capsule Take 1 capsule (100 mg total) by mouth 2 (two) times daily. 11/06/18   Starr Lake, CNM  Prenatal Vit-Fe Fumarate-FA (PRENATAL 19) 29-1 MG CHEW Chew 1 tablet by mouth daily. 10/17/18   Starr Lake, CNM    Family History Family History  Problem Relation Age of Onset  . Hypertension Father   . Hypertension Mother     Social History Social History   Tobacco Use  . Smoking status: Never Smoker  . Smokeless tobacco: Never Used  Substance Use Topics  . Alcohol use: No    Alcohol/week: 0.0 standard drinks  . Drug use: No     Allergies   Patient has no known allergies.   Review of Systems Review of Systems  Constitutional: Negative for fever.  Skin:       Abscess     Physical Exam Updated Vital Signs BP 126/71 (BP Location: Right Arm)   Pulse 89   Temp 98.1 F (36.7 C) (Oral)   Resp 16   LMP 07/29/2018 (Exact Date)   SpO2 98%  Physical Exam Vitals signs and nursing note reviewed.  Constitutional:      General: She is not in acute distress.    Appearance: She is well-developed.     Comments: Appears nontoxic  HENT:     Head: Normocephalic and atraumatic.  Neck:     Musculoskeletal: Normal range of motion.  Pulmonary:     Effort: Pulmonary effort is normal.  Abdominal:     General: There is no distension.  Musculoskeletal: Normal range of motion.  Skin:    General: Skin is warm.     Capillary Refill: Capillary refill takes less than 2 seconds.     Findings: No rash.     Comments: See picture below.  Central blister with purulent fluid.  Surrounding erythema with induration.  Neurological:     Mental Status: She is alert and oriented to person, place, and time.        ED Treatments / Results  Labs (all labs ordered are listed, but only abnormal results are displayed) Labs Reviewed - No data  to display  EKG None  Radiology No results found.  Procedures .Marland Kitchen.Incision and Drainage  Date/Time: 12/24/2018 6:52 PM Performed by: Alveria Apleyaccavale, Calandra Madura, PA-C Authorized by: Alveria Apleyaccavale, Ryle Buscemi, PA-C   Consent:    Consent obtained:  Verbal   Consent given by:  Patient   Risks discussed:  Bleeding, damage to other organs, incomplete drainage, pain and infection Location:    Type:  Abscess   Location:  Lower extremity   Lower extremity location:  Leg   Leg location:  L lower leg Pre-procedure details:    Skin preparation:  Betadine Anesthesia (see MAR for exact dosages):    Anesthesia method:  Local infiltration   Local anesthetic:  Lidocaine 2% WITH epi Procedure type:    Complexity:  Simple Procedure details:    Incision types:  Single straight   Incision depth:  Dermal   Scalpel blade:  11   Wound management:  Probed and deloculated and irrigated with saline   Drainage:  Purulent and bloody   Drainage amount:  Moderate   Wound treatment:  Wound left open   Packing materials:  None Post-procedure details:    Patient tolerance of procedure:  Tolerated well, no immediate complications   (including critical care time)  Medications Ordered in ED Medications  lidocaine-EPINEPHrine (XYLOCAINE W/EPI) 2 %-1:200000 (PF) injection 5 mL (5 mLs Infiltration Given 12/24/18 1613)     Initial Impression / Assessment and Plan / ED Course  I have reviewed the triage vital signs and the nursing notes.  Pertinent labs & imaging results that were available during my care of the patient were reviewed by me and considered in my medical decision making (see chart for details).        Patient presenting for evaluation of leg abscess.  Physical exam reassuring, she appears nontoxic.  No signs of systemic infection.  I&D performed as described above.  Discussed aftercare instructions.  Will place patient on clindamycin for surrounding cellulitis.  Encourage monitoring for signs of worsening  infection.  At this time, patient appears safe for discharge.  Return precautions given.  Patient states she understands and agrees to plan.   Final Clinical Impressions(s) / ED Diagnoses   Final diagnoses:  Abscess    ED Discharge Orders         Ordered    clindamycin (CLEOCIN) 150 MG capsule  3 times daily     12/24/18 1638  Alveria ApleyCaccavale, Kushal Saunders, PA-C 12/24/18 1854    Charlynne PanderYao, David Hsienta, MD 12/26/18 [phone]

## 2018-12-24 NOTE — ED Triage Notes (Signed)
Pt in POV, presents with abscess to LLE. States she felt a bug bite her leg a few days ago.

## 2018-12-24 NOTE — ED Triage Notes (Signed)
Pt is approx [redacted] week pregnant, EDD 05/12/19.

## 2018-12-26 ENCOUNTER — Telehealth: Payer: Medicaid Other | Admitting: Advanced Practice Midwife

## 2018-12-26 ENCOUNTER — Encounter: Payer: Self-pay | Admitting: Advanced Practice Midwife

## 2018-12-26 ENCOUNTER — Other Ambulatory Visit: Payer: Self-pay

## 2018-12-26 DIAGNOSIS — Z5329 Procedure and treatment not carried out because of patient's decision for other reasons: Secondary | ICD-10-CM

## 2018-12-26 DIAGNOSIS — Z3492 Encounter for supervision of normal pregnancy, unspecified, second trimester: Secondary | ICD-10-CM

## 2018-12-26 DIAGNOSIS — Z91199 Patient's noncompliance with other medical treatment and regimen due to unspecified reason: Secondary | ICD-10-CM

## 2018-12-26 NOTE — Progress Notes (Signed)
   TELEHEALTH VIRTUAL OBSTETRICS VISIT ENCOUNTER NOTE  I connected with Burtis Junes on 12/26/18 at 10:35 AM EDT by telephone at home and verified that I am speaking with the correct person using two identifiers.   I discussed the limitations, risks, security and privacy concerns of performing an evaluation and management service by telephone and the availability of in person appointments. I also discussed with the patient that there may be a patient responsible charge related to this service. The patient expressed understanding and agreed to proceed.  CMA spoke with patient and did intake part of the visit. Patient did not log on to [employer]. Multiple attempts to contact her by phone and patient was not answering.   Marcille Buffy DNP, CNM  12/26/18  11:07 AM  [facility]

## 2018-12-28 ENCOUNTER — Other Ambulatory Visit: Payer: Self-pay | Admitting: *Deleted

## 2018-12-28 DIAGNOSIS — Z3490 Encounter for supervision of normal pregnancy, unspecified, unspecified trimester: Secondary | ICD-10-CM

## 2018-12-28 DIAGNOSIS — Z87898 Personal history of other specified conditions: Secondary | ICD-10-CM

## 2018-12-29 ENCOUNTER — Encounter: Payer: Self-pay | Admitting: Student

## 2018-12-29 NOTE — Addendum Note (Signed)
Addended by: Samuel Germany on: 12/29/2018 08:40 AM   Modules accepted: Orders

## 2019-01-01 ENCOUNTER — Other Ambulatory Visit: Payer: Medicaid Other

## 2019-01-01 ENCOUNTER — Ambulatory Visit (HOSPITAL_COMMUNITY): Payer: Medicaid Other | Attending: Student

## 2019-01-15 ENCOUNTER — Inpatient Hospital Stay (HOSPITAL_COMMUNITY)
Admission: AD | Admit: 2019-01-15 | Discharge: 2019-01-15 | Disposition: A | Discharge status: Home / Self Care | Payer: Medicaid Other | Attending: Obstetrics and Gynecology | Admitting: Obstetrics and Gynecology

## 2019-01-15 ENCOUNTER — Encounter (HOSPITAL_COMMUNITY): Payer: Self-pay | Admitting: Emergency Medicine

## 2019-01-15 ENCOUNTER — Inpatient Hospital Stay (HOSPITAL_COMMUNITY): Payer: Medicaid Other

## 2019-01-15 ENCOUNTER — Other Ambulatory Visit: Payer: Self-pay

## 2019-01-15 DIAGNOSIS — O26892 Other specified pregnancy related conditions, second trimester: Secondary | ICD-10-CM

## 2019-01-15 DIAGNOSIS — O212 Late vomiting of pregnancy: Secondary | ICD-10-CM | POA: Diagnosis not present

## 2019-01-15 DIAGNOSIS — N12 Tubulo-interstitial nephritis, not specified as acute or chronic: Secondary | ICD-10-CM | POA: Diagnosis not present

## 2019-01-15 DIAGNOSIS — Z3A24 24 weeks gestation of pregnancy: Secondary | ICD-10-CM | POA: Diagnosis not present

## 2019-01-15 DIAGNOSIS — R350 Frequency of micturition: Secondary | ICD-10-CM

## 2019-01-15 DIAGNOSIS — R109 Unspecified abdominal pain: Secondary | ICD-10-CM | POA: Insufficient documentation

## 2019-01-15 DIAGNOSIS — O2302 Infections of kidney in pregnancy, second trimester: Secondary | ICD-10-CM | POA: Diagnosis not present

## 2019-01-15 DIAGNOSIS — R509 Fever, unspecified: Secondary | ICD-10-CM

## 2019-01-15 DIAGNOSIS — M549 Dorsalgia, unspecified: Secondary | ICD-10-CM

## 2019-01-15 DIAGNOSIS — R112 Nausea with vomiting, unspecified: Secondary | ICD-10-CM

## 2019-01-15 LAB — CBC WITH DIFFERENTIAL/PLATELET
Abs Immature Granulocytes: 0.11 10*3/uL — ABNORMAL HIGH (ref 0.00–0.07)
Basophils Absolute: 0 10*3/uL (ref 0.0–0.1)
Basophils Relative: 0 %
Eosinophils Absolute: 0 10*3/uL (ref 0.0–0.5)
Eosinophils Relative: 0 %
HCT: 35.9 % — ABNORMAL LOW (ref 36.0–46.0)
Hemoglobin: 11.5 g/dL — ABNORMAL LOW (ref 12.0–15.0)
Immature Granulocytes: 1 %
Lymphocytes Relative: 15 %
Lymphs Abs: 1.9 10*3/uL (ref 0.7–4.0)
MCH: 28 pg (ref 26.0–34.0)
MCHC: 32 g/dL (ref 30.0–36.0)
MCV: 87.6 fL (ref 80.0–100.0)
Monocytes Absolute: 1.9 10*3/uL — ABNORMAL HIGH (ref 0.1–1.0)
Monocytes Relative: 15 %
Neutro Abs: 8.5 10*3/uL — ABNORMAL HIGH (ref 1.7–7.7)
Neutrophils Relative %: 69 %
Platelets: 233 10*3/uL (ref 150–400)
RBC: 4.1 MIL/uL (ref 3.87–5.11)
RDW: 13.9 % (ref 11.5–15.5)
WBC: 12.5 10*3/uL — ABNORMAL HIGH (ref 4.0–10.5)
nRBC: 0 % (ref 0.0–0.2)

## 2019-01-15 LAB — URINALYSIS, ROUTINE W REFLEX MICROSCOPIC
Bilirubin Urine: NEGATIVE
Glucose, UA: NEGATIVE mg/dL
Hgb urine dipstick: NEGATIVE
Ketones, ur: 20 mg/dL — AB
Nitrite: POSITIVE — AB
Protein, ur: NEGATIVE mg/dL
Specific Gravity, Urine: 1.021 (ref 1.005–1.030)
pH: 6 (ref 5.0–8.0)

## 2019-01-15 LAB — COMPREHENSIVE METABOLIC PANEL
ALT: 6 U/L (ref 0–44)
AST: 15 U/L (ref 15–41)
Albumin: 2.3 g/dL — ABNORMAL LOW (ref 3.5–5.0)
Alkaline Phosphatase: 87 U/L (ref 38–126)
Anion gap: 7 (ref 5–15)
BUN: <5 mg/dL — ABNORMAL LOW (ref 6–20)
CO2: 23 mmol/L (ref 22–32)
Calcium: 8.6 mg/dL — ABNORMAL LOW (ref 8.9–10.3)
Chloride: 104 mmol/L (ref 98–111)
Creatinine, Ser: 0.55 mg/dL (ref 0.44–1.00)
GFR calc Af Amer: >60 mL/min (ref >60)
GFR calc non Af Amer: >60 mL/min (ref >60)
Glucose, Bld: 84 mg/dL (ref 70–99)
Potassium: 3.5 mmol/L (ref 3.5–5.1)
Sodium: 134 mmol/L — ABNORMAL LOW (ref 135–145)
Total Bilirubin: 0.5 mg/dL (ref 0.3–1.2)
Total Protein: 6.6 g/dL (ref 6.5–8.1)

## 2019-01-15 MED ORDER — FAMOTIDINE IN NACL 20-0.9 MG/50ML-% IV SOLN
20.0000 mg | Freq: Once | INTRAVENOUS | Status: AC
  Administered 2019-01-15: 20 mg via INTRAVENOUS
  Filled 2019-01-15: qty 50

## 2019-01-15 MED ORDER — SODIUM CHLORIDE 0.9 % IV SOLN
8.0000 mg | Freq: Once | INTRAVENOUS | Status: AC
  Administered 2019-01-15: 8 mg via INTRAVENOUS
  Filled 2019-01-15: qty 4

## 2019-01-15 MED ORDER — CEPHALEXIN 500 MG PO CAPS: 500.0000 mg | ORAL_CAPSULE | Freq: Four times a day (QID) | ORAL | 0 refills | Status: AC

## 2019-01-15 MED ORDER — LIDOCAINE HCL (PF) 1 % IJ SOLN
INTRAMUSCULAR | Status: AC
  Filled 2019-01-15: qty 5

## 2019-01-15 MED ORDER — OXYCODONE-ACETAMINOPHEN 5-325 MG PO TABS: 1.0000 | ORAL_TABLET | Freq: Four times a day (QID) | ORAL | 0 refills | Status: DC | PRN

## 2019-01-15 MED ORDER — LACTATED RINGERS IV BOLUS
1000.0000 mL | Freq: Once | INTRAVENOUS | Status: AC
  Administered 2019-01-15: 1000 mL via INTRAVENOUS

## 2019-01-15 MED ORDER — CEFTRIAXONE SODIUM 250 MG IJ SOLR
250.0000 mg | Freq: Once | INTRAMUSCULAR | Status: AC
  Administered 2019-01-15: 250 mg via INTRAMUSCULAR
  Filled 2019-01-15: qty 250

## 2019-01-15 MED ORDER — ONDANSETRON 8 MG PO TBDP: 8.0000 mg | ORAL_TABLET | Freq: Three times a day (TID) | ORAL | 0 refills | Status: AC | PRN

## 2019-01-15 MED ORDER — HYDROMORPHONE HCL 1 MG/ML IJ SOLN: 1.0000 mg | Freq: Once | INTRAMUSCULAR | Status: DC

## 2019-01-15 MED ORDER — HYDROMORPHONE HCL 1 MG/ML IJ SOLN
1.0000 mg | Freq: Once | INTRAMUSCULAR | Status: AC
  Administered 2019-01-15: 1 mg via INTRAVENOUS
  Filled 2019-01-15: qty 1

## 2019-01-15 NOTE — MAU Provider Note (Signed)
History     CSN: [id number]  Arrival date and time: 01/15/19 4098   First Provider Initiated Contact with Patient 01/15/19 1049      Chief Complaint  Patient presents with  . Fever    pt is 6 months pregnant, had covid test 10 days ago negative. Has had fever for 24 hours  . Flank Pain    urine is cloudy colored.  . Emesis    pt vomited undigested food at 11:00 pm   Karen Curry is a 23 y.o. G2P1001 at [redacted]w[redacted]d who presents to MAU for left flank pain. Pt had a kidney stone earlier this pregnancy, around 2 months. Pt had a UTI at her NOB visit in early June, but no TOC was completed. Pt reports frequency of urination. Pt also reports that she has cysts on her right kidney.  Onset: Sunday afternoon Location: left flank Duration: <24hrs Character: fever 101, N/V, red urine, sharp, intermittent, vomiting x2 since pain began Aggravating/Associated: none Relieving: antibiotics Treatment: none Severity: 8/10  Pt denies VB, LOF, ctx, vaginal discharge/odor/itching. Pt denies abdominal pain, constipation, diarrhea, or urinary problems. Pt denies chills, fatigue, sweating or changes in appetite. Pt denies SOB or chest pain. Pt denies dizziness, HA, light-headedness, weakness.  Problems this pregnancy include: hx of kidney stones, +UTI. Allergies? NKDA Current medications/supplements? PNVs Prenatal care provider? [facility], next appt 01/31/2019   OB History    Gravida  2   Para  1   Term  1   Preterm  0   AB  0   Living  1     SAB  0   TAB  0   Ectopic  0   Multiple  0   Live Births  1           Past Medical History:  Diagnosis Date  . Asthma   . Kidney stones    23 yo  . UTI (urinary tract infection)     Past Surgical History:  Procedure Laterality Date  . NO PAST SURGERIES      Family History  Problem Relation Age of Onset  . Hypertension Father   . Hypertension Mother     Social History   Tobacco Use  . Smoking status: Never  Smoker  . Smokeless tobacco: Never Used  Substance Use Topics  . Alcohol use: No    Alcohol/week: 0.0 standard drinks  . Drug use: No    Allergies: No Known Allergies  Medications Prior to Admission  Medication Sig Dispense Refill Last Dose  . nitrofurantoin, macrocrystal-monohydrate, (MACROBID) 100 MG capsule Take 1 capsule (100 mg total) by mouth 2 (two) times daily. 14 capsule 0 01/15/2019 at Unknown time  . Prenatal Vit-Fe Fumarate-FA (PRENATAL 19) 29-1 MG CHEW Chew 1 tablet by mouth daily. 30 tablet 11 01/15/2019 at Unknown time  . nitrofurantoin, macrocrystal-monohydrate, (MACROBID) 100 MG capsule Take 1 capsule (100 mg total) by mouth 2 (two) times daily. 14 capsule 1     Review of Systems  Constitutional: Positive for fever. Negative for chills, diaphoresis and fatigue.  Respiratory: Negative for shortness of breath.   Cardiovascular: Negative for chest pain.  Gastrointestinal: Positive for nausea and vomiting. Negative for abdominal pain, constipation and diarrhea.  Genitourinary: Positive for flank pain, frequency and hematuria. Negative for dysuria, pelvic pain, urgency, vaginal bleeding and vaginal discharge.  Neurological: Negative for dizziness, weakness, light-headedness and headaches.   Physical Exam   Blood pressure 100/63, pulse 85, temperature 98.1 F (36.7 C),  temperature source Oral, resp. rate 16, weight 101.2 kg, last menstrual period 07/29/2018, SpO2 98 %, unknown if currently breastfeeding.   Patient Vitals for the past 24 hrs:  BP Temp Temp src Pulse Resp SpO2 Weight  01/15/19 1335 100/63 - - 85 - - -  01/15/19 1330 - 98.1 F (36.7 C) Oral 86 16 - -  01/15/19 1315 - - - - - 98 % -  01/15/19 1310 - - - - - 98 % -  01/15/19 1305 - - - - - 98 % -  01/15/19 1300 - - - - - 97 % -  01/15/19 1255 - - - - - 99 % -  01/15/19 1250 - - - - - 99 % -  01/15/19 1115 - - - - - 98 % -  01/15/19 1110 - - - - - 98 % -  01/15/19 1105 - - - - - 98 % -  01/15/19 1100 -  - - - - 99 % -  01/15/19 1005 127/77 98.4 F (36.9 C) - 99 16 100 % -  01/15/19 0919 - - - - - - 101.2 kg  01/15/19 0902 119/74 98.5 F (36.9 C) Oral 97 14 98 % -   Physical Exam  Constitutional: She is oriented to person, place, and time. She appears well-developed and well-nourished. No distress.  HENT:  Head: Normocephalic and atraumatic.  Respiratory: Effort normal.  GI: Soft. She exhibits no distension and no mass. There is no abdominal tenderness. There is CVA tenderness. There is no rebound and no guarding.  Neurological: She is alert and oriented to person, place, and time.  Skin: Skin is warm and dry. She is not diaphoretic.  Psychiatric: She has a normal mood and affect. Her behavior is normal. Judgment and thought content normal.   Results for orders placed or performed during the hospital encounter of 01/15/19 (from the past 24 hour(s))  Urinalysis, Routine w reflex microscopic     Status: Abnormal   Collection Time: 01/15/19 10:01 AM  Result Value Ref Range   Color, Urine YELLOW YELLOW   APPearance HAZY (A) CLEAR   Specific Gravity, Urine 1.021 1.005 - 1.030   pH 6.0 5.0 - 8.0   Glucose, UA NEGATIVE NEGATIVE mg/dL   Hgb urine dipstick NEGATIVE NEGATIVE   Bilirubin Urine NEGATIVE NEGATIVE   Ketones, ur 20 (A) NEGATIVE mg/dL   Protein, ur NEGATIVE NEGATIVE mg/dL   Nitrite POSITIVE (A) NEGATIVE   Leukocytes,Ua TRACE (A) NEGATIVE   RBC / HPF 0-5 0 - 5 RBC/hpf   WBC, UA 21-50 0 - 5 WBC/hpf   Bacteria, UA MANY (A) NONE SEEN   Squamous Epithelial / LPF 0-5 0 - 5   Mucus PRESENT   CBC with Differential/Platelet     Status: Abnormal   Collection Time: 01/15/19 11:29 AM  Result Value Ref Range   WBC 12.5 (H) 4.0 - 10.5 K/uL   RBC 4.10 3.87 - 5.11 MIL/uL   Hemoglobin 11.5 (L) 12.0 - 15.0 g/dL   HCT 16.135.9 (L) 09.636.0 - 04.546.0 %   MCV 87.6 80.0 - 100.0 fL   MCH 28.0 26.0 - 34.0 pg   MCHC 32.0 30.0 - 36.0 g/dL   RDW 40.913.9 81.111.5 - 91.415.5 %   Platelets 233 150 - 400 K/uL   nRBC  0.0 0.0 - 0.2 %   Neutrophils Relative % 69 %   Neutro Abs 8.5 (H) 1.7 - 7.7 K/uL   Lymphocytes Relative 15 %  Lymphs Abs 1.9 0.7 - 4.0 K/uL   Monocytes Relative 15 %   Monocytes Absolute 1.9 (H) 0.1 - 1.0 K/uL   Eosinophils Relative 0 %   Eosinophils Absolute 0.0 0.0 - 0.5 K/uL   Basophils Relative 0 %   Basophils Absolute 0.0 0.0 - 0.1 K/uL   Immature Granulocytes 1 %   Abs Immature Granulocytes 0.11 (H) 0.00 - 0.07 K/uL  Comprehensive metabolic panel     Status: Abnormal   Collection Time: 01/15/19 11:29 AM  Result Value Ref Range   Sodium 134 (L) 135 - 145 mmol/L   Potassium 3.5 3.5 - 5.1 mmol/L   Chloride 104 98 - 111 mmol/L   CO2 23 22 - 32 mmol/L   Glucose, Bld 84 70 - 99 mg/dL   BUN <5 (L) 6 - 20 mg/dL   Creatinine, Ser 1.610.55 0.44 - 1.00 mg/dL   Calcium 8.6 (L) 8.9 - 10.3 mg/dL   Total Protein 6.6 6.5 - 8.1 g/dL   Albumin 2.3 (L) 3.5 - 5.0 g/dL   AST 15 15 - 41 U/L   ALT 6 0 - 44 U/L   Alkaline Phosphatase 87 38 - 126 U/L   Total Bilirubin 0.5 0.3 - 1.2 mg/dL   GFR calc non Af Amer >60 >60 mL/min   GFR calc Af Amer >60 >60 mL/min   Anion gap 7 5 - 15   [place] Renal  Result Date: 01/15/2019 CLINICAL DATA:  CVA tenderness.  Pregnancy. EXAM: RENAL / URINARY TRACT ULTRASOUND COMPLETE COMPARISON:  10/01/2018 FINDINGS: Right Kidney: Renal measurements: 10.9 x 5.5 x 5.4 cm = volume: 171 mL . Echogenicity within normal limits. Redemonstration 2 simple renal cysts, the largest measuring up to 2.7 cm at the superior pole and an additional 0.8 cm cyst in the interpolar region. No shadowing stone. No solid mass or hydronephrosis visualized. Left Kidney: Renal measurements: 12.3 x 5.7 x 5.4 cm = volume: 201 mL. Echogenicity within normal limits. Mild hydronephrosis. No shadowing stone. No mass visualized. Bladder: Appears normal for degree of bladder distention. IMPRESSION: 1. Mild left hydronephrosis, similar to prior. 2. Unchanged simple right renal cysts. Electronically Signed   By:  Duanne GuessNicholas  Plundo M.D.   On: 01/15/2019 12:48   MAU Course  Procedures  MDM -pyelo vs. kidney stones -+CVA tenderness, left -pt reports fever at home, but temp normal in MAU, pt denies taking antipyrectics at home -N/V - 1L LR + 8mg  Zofran + 20mg  Pepcid given, pt reports N/V resolved, PO challenge successful -Dilaudid 1mg  given for pain, pt reports pain now 4/10 and is sleeping in room -UA: hazy/20ketones/+nitrites/trace leuks/many bacteria, sending urine for culture -pt reported hematuria, no hgb on UA -CBC: WNL for pregnancy, WBCs 12.5 -CMP: no abnormalities requiring treatment -Renal US: mild, left hydronephrosis, unchanged simple, right, renal cysts -EFM: reactive       -baseline: 125       -variability: moderate       -accels: present, 10x10       -decels: absent       -TOCO: irritability, no ctx -called and spoke with Dr. Tenny Crawoss @1506 . Per Dr. Tenny Crawoss, give IM Rocephin and send patient home with cephalexin 500 QID x7days. Clarified with Dr. Tenny Crawoss how many milligrams of Rocephin she would like the patient to receive and Dr. Tenny Crawoss states 250mg . Per Dr. Tenny Crawoss, pt OK to keep appt in September (no need for earlier appointment) and can be discharged home after IM injection. -pt discharged to home in stable  condition  Orders Placed This Encounter  Procedures  . Culture, OB Urine    Standing Status:   Standing    Number of Occurrences:   1  . US RENAL    Standing Status:   Standing    Number of Occurrences:   1    Order Specific Question:   Symptom/Reason for Exam    Answer:   Costovertebral angle tenderness [[id number]]  . Urinalysis, Routine w reflex microscopic    Standing Status:   Standing    Number of Occurrences:   1  . CBC with Differential/Platelet    Standing Status:   Standing    Number of Occurrences:   1  . Comprehensive metabolic panel    Standing Status:   Standing    Number of Occurrences:   1  . Insert peripheral IV    Standing Status:   Standing    Number of  Occurrences:   1  . Discharge patient    Order Specific Question:   Discharge disposition    Answer:   01-Home or Self Care [1]    Order Specific Question:   Discharge patient date    Answer:   01/15/2019   Meds ordered this encounter  Medications  . lactated ringers bolus 1,000 mL  . ondansetron (ZOFRAN) 8 mg in sodium chloride 0.9 % 50 mL IVPB  . famotidine (PEPCID) IVPB 20 mg premix  . DISCONTD: HYDROmorphone (DILAUDID) injection 1 mg  . HYDROmorphone (DILAUDID) injection 1 mg  . cefTRIAXone (ROCEPHIN) injection 250 mg    Order Specific Question:   Antibiotic Indication:    Answer:   UTI  . lidocaine (PF) (XYLOCAINE) 1 % injection    Nolon Lennert, Traci   : cabinet override  . cephALEXin (KEFLEX) 500 MG capsule    Sig: Take 1 capsule (500 mg total) by mouth 4 (four) times daily for 7 days.    Dispense:  28 capsule    Refill:  0    Order Specific Question:   Supervising Provider    Answer:   ERVIN, MICHAEL L [[id number]]  . ondansetron (ZOFRAN ODT) 8 MG disintegrating tablet    Sig: Take 1 tablet (8 mg total) by mouth every 8 (eight) hours as needed for up to 7 days for nausea or vomiting.    Dispense:  21 tablet    Refill:  0    Order Specific Question:   Supervising Provider    Answer:   ERVIN, MICHAEL L [[id number]]  . oxyCODONE-acetaminophen (PERCOCET) 5-325 MG tablet    Sig: Take 1 tablet by mouth every 6 (six) hours as needed for up to 3 doses for severe pain.    Dispense:  3 tablet    Refill:  0    Order Specific Question:   Supervising Provider    Answer:   ERVIN, MICHAEL L [[id number]]   Assessment and Plan   1. Pyelonephritis   2. Costovertebral angle tenderness   3. Left flank pain   4. Fever, unspecified fever cause   5. Non-intractable vomiting with nausea, unspecified vomiting type   6. Urinary frequency    Allergies as of 01/15/2019   No Known Allergies     Medication List    STOP taking these medications   nitrofurantoin (macrocrystal-monohydrate) 100 MG capsule Commonly  known as: MACROBID     TAKE these medications   cephALEXin 500 MG capsule Commonly known as: KEFLEX Take 1 capsule (500 mg total) by mouth 4 (four) times daily  for 7 days.   ondansetron 8 MG disintegrating tablet Commonly known as: Zofran ODT Take 1 tablet (8 mg total) by mouth every 8 (eight) hours as needed for up to 7 days for nausea or vomiting.   oxyCODONE-acetaminophen 5-325 MG tablet Commonly known as: Percocet Take 1 tablet by mouth every 6 (six) hours as needed for up to 3 doses for severe pain.   Prenatal 19 29-1 MG Chew Chew 1 tablet by mouth daily.      -will call with culture results, as needed -RX Zofran -RX Percocet (3 pills only) -RX Cephalexin -discussed expected course of pyelonephritis with outpatient treatment, pt instructed to return to MAU with new/worsening s/sx -keep appt in September with OB -strict pyelonephritis/fever/pain/bleeding/return MAU precautions given -pt discharged to home in stable condition  Joni Reiningicole E Nugent 01/15/2019, 4:12 PM

## 2019-01-15 NOTE — ED Triage Notes (Signed)
Pt started with a fever yesterday. She has a H/o kidney stones.Pt c/o pain in the right flank area and  states she is voiding frequently. She states she is feeling the baby move. Pt is a gravida 2 para 1. Had a negative covid screen 10 days ago. 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 Pt states her pain is 8/10. Her urine cloudy and is blood tinged.

## 2019-01-15 NOTE — MAU Note (Signed)
.   Karen Curry is a 23 y.o. at [redacted]w[redacted]d here in MAU reporting: pain in her left lower abdominal that radiates into her left side up into her shoulder that started this morning around 3am. PT reports a fever of 101 at home  Onset of complaint: 3am Pain score: 8 Vitals:   01/15/19 0902 01/15/19 1005  BP: 119/74 127/77  Pulse: 97 99  Resp: 14 16  Temp: 98.5 F (36.9 C) 98.4 F (36.9 C)  SpO2: 98% 100%     FHT:136 Lab orders placed from triage:

## 2019-01-15 NOTE — Discharge Instructions (Signed)
Pyelonephritis, Adult Pyelonephritis is an infection that occurs in the kidney. The kidneys are the organs that filter a person's blood and move waste out of the bloodstream and into the urine. Urine passes from the kidneys, through tubes called ureters, and into the bladder. There are two main types of pyelonephritis:  Infections that come on quickly without any warning (acute pyelonephritis).  Infections that last for a long period of time (chronic pyelonephritis). In most cases, the infection clears up with treatment and does not cause further problems. More severe infections or chronic infections can sometimes spread to the bloodstream or lead to other problems with the kidneys. What are the causes? This condition is usually caused by:  Bacteria traveling from the bladder up to the kidney. This may occur after having a bladder infection (cystitis) or urinary tract infection (UTI).  Bladder infections caused from bacteria traveling from the bloodstream to the kidney. What increases the risk? This condition is more likely to develop in:  Pregnant women.  Older people.  People who have any of these conditions: ? Diabetes. ? Inflammation of the prostate gland (prostatitis), in males. ? Kidney stones or bladder stones. ? Other abnormalities of the kidney or ureter. ? Cancer.  People who have a catheter placed in the bladder.  People who are sexually active.  Women who use spermicides.  People who have had a prior UTI. What are the signs or symptoms? Symptoms of this condition include:  Frequent urination.  Strong or persistent urge to urinate.  Burning or stinging when urinating.  Abdominal pain.  Back pain.  Pain in the side or flank area.  Fever or chills.  Blood in the urine, or dark urine.  Nausea or vomiting. How is this diagnosed? This condition may be diagnosed based on:  Your medical history and a physical exam.  Urine tests.  Blood tests. You may  also have imaging tests of the kidneys, such as an ultrasound or CT scan. How is this treated? Treatment for this condition may depend on the severity of the infection.  If the infection is mild and is found early, you may be treated with antibiotic medicines taken by mouth (orally). You will need to drink fluids to remain hydrated.  If the infection is more severe, you may need to stay in the hospital and receive antibiotics given directly into a vein through an IV. You may also need to receive fluids through an IV if you are not able to remain hydrated. After your hospital stay, you may need to take oral antibiotics for a period of time. Other treatments may be required, depending on the cause of the infection. Follow these instructions at home: Medicines  Take your antibiotic medicine as told by your health care provider. Do not stop taking the antibiotic even if you start to feel better.  Take over-the-counter and prescription medicines only as told by your health care provider. General instructions   Drink enough fluid to keep your urine pale yellow.  Avoid caffeine, tea, and carbonated beverages. They tend to irritate the bladder.  Urinate often. Avoid holding in urine for long periods of time.  Urinate before and after sex.  After a bowel movement, women should cleanse from front to back. Use each tissue only once.  Keep all follow-up visits as told by your health care provider. This is important. Contact a health care provider if:  Your symptoms do not get better after 2 days of treatment.  Your symptoms get worse.    You have a fever. Get help right away if you:  Are unable to take your antibiotics or fluids.  Have shaking chills.  Vomit.  Have severe flank or back pain.  Have extreme weakness or fainting. Summary  Pyelonephritis is a urinary tract infection (UTI) that occurs in the kidney.  Treatment for this condition may depend on the severity of the  infection.  Take your antibiotic medicine as told by your health care provider. Do not stop taking the antibiotic even if you start to feel better.  Drink enough fluid to keep your urine pale yellow.  Keep all follow-up visits as told by your health care provider. This is important. This information is not intended to replace advice given to you by your health care provider. Make sure you discuss any questions you have with your health care provider. Document Released: 05/17/2005 Document Revised: 03/21/2018 Document Reviewed: 03/21/2018 Elsevier Patient Education  2020 [employer]  

## 2019-01-18 LAB — CULTURE, OB URINE: Culture: >=100000 — AB

## 2019-01-25 ENCOUNTER — Ambulatory Visit (HOSPITAL_COMMUNITY)
Admission: RE | Admit: 2019-01-25 | Discharge: 2019-01-25 | Disposition: A | Discharge status: Home / Self Care | Payer: Medicaid Other | Source: Ambulatory Visit | Attending: Obstetrics and Gynecology | Admitting: Obstetrics and Gynecology

## 2019-02-19 ENCOUNTER — Telehealth: Payer: Self-pay | Admitting: Family Medicine

## 2019-02-19 NOTE — Telephone Encounter (Signed)
Called the patient to confirm the upcoming visit. Left a voicemail advising the patient of if she has been diagnosed with covid, in close contact with someone who has had covid, or experienced any flu-like symptoms such as fever rash or sore throat please call our office to schedule. Also no visitors or children are permitted at this time due to covid19 restrictions.  °

## 2019-02-20 ENCOUNTER — Inpatient Hospital Stay (HOSPITAL_COMMUNITY): Payer: Medicaid Other

## 2019-02-20 ENCOUNTER — Encounter (HOSPITAL_COMMUNITY): Payer: Self-pay | Admitting: *Deleted

## 2019-02-20 ENCOUNTER — Encounter: Payer: Medicaid Other | Admitting: Student

## 2019-02-20 ENCOUNTER — Inpatient Hospital Stay (HOSPITAL_COMMUNITY)
Admission: AD | Admit: 2019-02-20 | Discharge: 2019-02-20 | Disposition: A | Discharge status: Home / Self Care | Payer: Medicaid Other | Attending: Obstetrics & Gynecology | Admitting: Obstetrics & Gynecology

## 2019-02-20 ENCOUNTER — Other Ambulatory Visit: Payer: Medicaid Other

## 2019-02-20 ENCOUNTER — Other Ambulatory Visit: Payer: Self-pay

## 2019-02-20 ENCOUNTER — Other Ambulatory Visit (HOSPITAL_COMMUNITY): Payer: Medicaid Other

## 2019-02-20 DIAGNOSIS — O26893 Other specified pregnancy related conditions, third trimester: Secondary | ICD-10-CM | POA: Diagnosis present

## 2019-02-20 DIAGNOSIS — J45909 Unspecified asthma, uncomplicated: Secondary | ICD-10-CM | POA: Insufficient documentation

## 2019-02-20 DIAGNOSIS — O212 Late vomiting of pregnancy: Secondary | ICD-10-CM | POA: Insufficient documentation

## 2019-02-20 DIAGNOSIS — Z3A29 29 weeks gestation of pregnancy: Secondary | ICD-10-CM | POA: Insufficient documentation

## 2019-02-20 DIAGNOSIS — R109 Unspecified abdominal pain: Secondary | ICD-10-CM | POA: Insufficient documentation

## 2019-02-20 DIAGNOSIS — Z87442 Personal history of urinary calculi: Secondary | ICD-10-CM | POA: Diagnosis not present

## 2019-02-20 DIAGNOSIS — O99513 Diseases of the respiratory system complicating pregnancy, third trimester: Secondary | ICD-10-CM | POA: Insufficient documentation

## 2019-02-20 DIAGNOSIS — R10A1 Flank pain, right side: Secondary | ICD-10-CM | POA: Diagnosis present

## 2019-02-20 LAB — CBC WITH DIFFERENTIAL/PLATELET
Abs Immature Granulocytes: 0.15 10*3/uL — ABNORMAL HIGH (ref 0.00–0.07)
Basophils Absolute: 0 10*3/uL (ref 0.0–0.1)
Basophils Relative: 0 %
Eosinophils Absolute: 0 10*3/uL (ref 0.0–0.5)
Eosinophils Relative: 0 %
HCT: 36.9 % (ref 36.0–46.0)
Hemoglobin: 12.6 g/dL (ref 12.0–15.0)
Immature Granulocytes: 2 %
Lymphocytes Relative: 5 %
Lymphs Abs: 0.4 10*3/uL — ABNORMAL LOW (ref 0.7–4.0)
MCH: 29 pg (ref 26.0–34.0)
MCHC: 34.1 g/dL (ref 30.0–36.0)
MCV: 84.8 fL (ref 80.0–100.0)
Monocytes Absolute: 0.1 10*3/uL (ref 0.1–1.0)
Monocytes Relative: 1 %
Neutro Abs: 7.3 10*3/uL (ref 1.7–7.7)
Neutrophils Relative %: 92 %
Platelets: 199 10*3/uL (ref 150–400)
RBC: 4.35 MIL/uL (ref 3.87–5.11)
RDW: 13.8 % (ref 11.5–15.5)
WBC: 8 10*3/uL (ref 4.0–10.5)
nRBC: 0 % (ref 0.0–0.2)

## 2019-02-20 LAB — COMPREHENSIVE METABOLIC PANEL
ALT: 12 U/L (ref 0–44)
AST: 15 U/L (ref 15–41)
Albumin: 2.2 g/dL — ABNORMAL LOW (ref 3.5–5.0)
Alkaline Phosphatase: 68 U/L (ref 38–126)
Anion gap: 5 (ref 5–15)
BUN: 7 mg/dL (ref 6–20)
CO2: 22 mmol/L (ref 22–32)
Calcium: 8.6 mg/dL — ABNORMAL LOW (ref 8.9–10.3)
Chloride: 105 mmol/L (ref 98–111)
Creatinine, Ser: 0.67 mg/dL (ref 0.44–1.00)
GFR calc Af Amer: >60 mL/min (ref >60)
GFR calc non Af Amer: >60 mL/min (ref >60)
Glucose, Bld: 94 mg/dL (ref 70–99)
Potassium: 3.6 mmol/L (ref 3.5–5.1)
Sodium: 132 mmol/L — ABNORMAL LOW (ref 135–145)
Total Bilirubin: 0.5 mg/dL (ref 0.3–1.2)
Total Protein: 6.5 g/dL (ref 6.5–8.1)

## 2019-02-20 LAB — URINALYSIS, ROUTINE W REFLEX MICROSCOPIC
Bilirubin Urine: NEGATIVE
Glucose, UA: NEGATIVE mg/dL
Hgb urine dipstick: NEGATIVE
Ketones, ur: NEGATIVE mg/dL
Nitrite: NEGATIVE
Protein, ur: NEGATIVE mg/dL
Specific Gravity, Urine: 1.008 (ref 1.005–1.030)
pH: 7 (ref 5.0–8.0)

## 2019-02-20 MED ORDER — FENTANYL CITRATE (PF) 100 MCG/2ML IJ SOLN: 100.0000 ug | INTRAMUSCULAR | Status: DC | PRN

## 2019-02-20 MED ORDER — HYDROMORPHONE HCL 1 MG/ML IJ SOLN
2.0000 mg | Freq: Once | INTRAMUSCULAR | Status: AC
  Administered 2019-02-20: 11:00:00 2 mg via INTRAVENOUS
  Filled 2019-02-20: qty 2

## 2019-02-20 MED ORDER — FENTANYL CITRATE (PF) 100 MCG/2ML IJ SOLN
50.0000 ug | Freq: Once | INTRAMUSCULAR | Status: AC
  Administered 2019-02-20: 09:00:00 50 ug via INTRAVENOUS
  Filled 2019-02-20: qty 2

## 2019-02-20 MED ORDER — KETOROLAC TROMETHAMINE 30 MG/ML IJ SOLN
30.0000 mg | Freq: Once | INTRAMUSCULAR | Status: AC
  Administered 2019-02-20: 14:00:00 30 mg via INTRAVENOUS
  Filled 2019-02-20: qty 1

## 2019-02-20 MED ORDER — OXYCODONE-ACETAMINOPHEN 5-325 MG PO TABS: 1.0000 | ORAL_TABLET | Freq: Four times a day (QID) | ORAL | 0 refills | Status: AC | PRN

## 2019-02-20 MED ORDER — ONDANSETRON HCL 4 MG/2ML IJ SOLN
4.0000 mg | Freq: Once | INTRAMUSCULAR | Status: AC
  Administered 2019-02-20: 4 mg via INTRAVENOUS
  Filled 2019-02-20: qty 2

## 2019-02-20 MED ORDER — SODIUM CHLORIDE 0.9 % IV BOLUS
1000.0000 mL | Freq: Once | INTRAVENOUS | Status: AC
  Administered 2019-02-20: 09:00:00 1000 mL via INTRAVENOUS

## 2019-02-20 MED ORDER — TAMSULOSIN HCL 0.4 MG PO CAPS: 0.8000 mg | ORAL_CAPSULE | Freq: Every day | ORAL | 0 refills | Status: AC

## 2019-02-20 NOTE — Discharge Instructions (Signed)
It is possible that you have a kidney stone. You will need to strain every urine when you go to the bathroom. You will need to be seen in your OB office this week for follow-up

## 2019-02-20 NOTE — ED Triage Notes (Signed)
Per [facility], pt has right flank pain since yesterday. Hx of kidney stones. 7 months pregnant. HR 130, BP 132/72

## 2019-02-20 NOTE — ED Provider Notes (Addendum)
[facility] EMERGENCY DEPARTMENT Provider Note   CSN: [id number] Arrival date & time: 02/20/19  7829     History   Chief Complaint Chief Complaint  Patient presents with  . Flank Pain    HPI Karen Curry is a 23 y.o. female.     HPI   Karen Curry is a 23 y.o. female, with a history of asthma, kidney stones, pyelonephritis in pregnancy, G2P1, presenting to the ED with right flank pain beginning last night.  Stabbing, 10/10, radiating to back. Accompanied by N/V and urinary frequency. Subjective fever with chills this morning. Took tylenol around 4 AM this morning.  Patient is pregnant with estimated due date May 06, 2019.  She is followed by [facility]. Denies diarrhea, abdominal pain, chest pain, shortness of breath, cough, vaginal bleeding/discharge, or any other complaints.    Past Medical History:  Diagnosis Date  . Asthma   . Kidney stones    23 yo  . UTI (urinary tract infection)     Patient Active Problem List   Diagnosis Date Noted  . UTI (urinary tract infection) 11/06/2018  . Supervision of low-risk pregnancy 10/17/2018  . History of low birth weight 10/17/2018  . Acanthosis 06/16/2015  . Hirsutism 06/16/2015  . Obesity (BMI 30-39.9) 06/16/2015    Past Surgical History:  Procedure Laterality Date  . NO PAST SURGERIES       OB History    Gravida  2   Para  1   Term  1   Preterm  0   AB  0   Living  1     SAB  0   TAB  0   Ectopic  0   Multiple  0   Live Births  1            Home Medications    Prior to Admission medications   Medication Sig Start Date End Date Taking? Authorizing Provider  oxyCODONE-acetaminophen (PERCOCET) 5-325 MG tablet Take 1 tablet by mouth every 6 (six) hours as needed for up to 3 doses for severe pain. 01/15/19   Nugent, Gerrie Nordmann, NP  Prenatal Vit-Fe Fumarate-FA (PRENATAL 19) 29-1 MG CHEW Chew 1 tablet by mouth daily. 10/17/18   Starr Lake, CNM     Family History Family History  Problem Relation Age of Onset  . Hypertension Father   . Hypertension Mother     Social History Social History   Tobacco Use  . Smoking status: Never Smoker  . Smokeless tobacco: Never Used  Substance Use Topics  . Alcohol use: No    Alcohol/week: 0.0 standard drinks  . Drug use: No     Allergies   Patient has no known allergies.   Review of Systems Review of Systems  Constitutional: Positive for chills and fever.  Respiratory: Negative for cough and shortness of breath.   Cardiovascular: Negative for chest pain.  Gastrointestinal: Positive for nausea and vomiting. Negative for abdominal pain, blood in stool and diarrhea.  Genitourinary: Positive for flank pain and frequency.  Musculoskeletal: Positive for back pain.  Neurological: Negative for syncope.  All other systems reviewed and are negative.    Physical Exam Updated Vital Signs BP (!) 98/59   Pulse (!) 123   Temp 99.1 F (37.3 C) (Oral)   Resp 18   LMP 07/29/2018 (Exact Date)   SpO2 98%   Physical Exam Vitals signs and nursing note reviewed.  Constitutional:      General: She  is not in acute distress.    Appearance: She is well-developed. She is not diaphoretic.  HENT:     Head: Normocephalic and atraumatic.     Mouth/Throat:     Mouth: Mucous membranes are moist.     Pharynx: Oropharynx is clear.  Eyes:     Conjunctiva/sclera: Conjunctivae normal.  Neck:     Musculoskeletal: Neck supple.  Cardiovascular:     Rate and Rhythm: Regular rhythm. Tachycardia present.     Pulses: Normal pulses.          Radial pulses are 2+ on the right side and 2+ on the left side.       Posterior tibial pulses are 2+ on the right side and 2+ on the left side.     Heart sounds: Normal heart sounds.     Comments: Tactile temperature in the extremities appropriate and equal bilaterally. Pulmonary:     Effort: Pulmonary effort is normal. No respiratory distress.     Breath sounds:  Normal breath sounds.  Abdominal:     Palpations: Abdomen is soft.     Tenderness: There is abdominal tenderness. There is right CVA tenderness. There is no guarding.     Comments: Tender to right flank.   Musculoskeletal:     Right lower leg: No edema.     Left lower leg: No edema.  Lymphadenopathy:     Cervical: No cervical adenopathy.  Skin:    General: Skin is warm and dry.  Neurological:     Mental Status: She is alert.  Psychiatric:        Mood and Affect: Mood and affect normal.        Speech: Speech normal.        Behavior: Behavior normal.      ED Treatments / Results  Labs (all labs ordered are listed, but only abnormal results are displayed) Labs Reviewed  URINALYSIS, ROUTINE W REFLEX MICROSCOPIC - Abnormal; Notable for the following components:      Result Value   Leukocytes,Ua SMALL (*)    Bacteria, UA MANY (*)    All other components within normal limits  COMPREHENSIVE METABOLIC PANEL - Abnormal; Notable for the following components:   Sodium 132 (*)    Calcium 8.6 (*)    Albumin 2.2 (*)    All other components within normal limits  CBC WITH DIFFERENTIAL/PLATELET - Abnormal; Notable for the following components:   Lymphs Abs 0.4 (*)    Abs Immature Granulocytes 0.15 (*)    All other components within normal limits  URINE CULTURE    EKG None  Radiology No results found.  Procedures Procedures (including critical care time)  Medications Ordered in ED Medications  sodium chloride 0.9 % bolus 1,000 mL (1,000 mLs Intravenous New Bag/Given 02/20/19 0835)  ondansetron (ZOFRAN) injection 4 mg (4 mg Intravenous Given 02/20/19 0900)  fentaNYL (SUBLIMAZE) injection 50 mcg (50 mcg Intravenous Given 02/20/19 0900)     Initial Impression / Assessment and Plan / ED Course  I have reviewed the triage vital signs and the nursing notes.  Pertinent labs & imaging results that were available during my care of the patient were reviewed by me and considered in my  medical decision making (see chart for details).  Clinical Course as of Feb 20 951  Tue Feb 20, 2019  0840 Spoke with Dr. Claudine Mouton, on call for [facility]. Reviewed ordered labs and agrees with proposed treatment. Requests patient be sent to MAU for continuous  fetal monitoring.    [SJ]    Clinical Course User Index [SJ] ,  C, PA-C       Patient presents for right flank and right back pain beginning last night.  Patient is nontoxic appearing, afebrile, not tachypneic, maintains excellent SPO2 on room air. Fetal heart rate 170 measured by RN.  RN was unable to get remote OB monitoring to function correctly.  Per RN, rapid response nurse was contacted and she was told patient would not need monitoring by rapid response. OB/GYN advised patient should be transferred to MAU.  Labs were drawn, IV fluids were started, and patient transferred.  Renal ultrasound pending at time of transfer.  Findings and plan of care discussed with Linwood Dibbles, MD.   Final Clinical Impressions(s) / ED Diagnoses   Final diagnoses:  Encounter for supervision of low-risk pregnancy, antepartum  History of low birth weight    ED Discharge Orders    None       Concepcion Living 02/20/19 0955    Anselm Pancoast, PA-C 02/20/19 0955    Linwood Dibbles, MD 02/21/19 [phone]

## 2019-02-20 NOTE — ED Notes (Signed)
Per charge RN, rapid response OB-GYN returned call and was informed that continuous fetal monitoring is unable to occur at this time d/t TOCO not working, Fetal Heart checked with doppler and is 175 and OB-GYN is aware along with Joy, PA who is at the bedside, will continue to monitor

## 2019-02-20 NOTE — ED Notes (Signed)
Pt being tranported to MAU at this time

## 2019-02-20 NOTE — ED Notes (Signed)
Per Caryl Asp, PA pt to not be transported to [place] until pt taken to MAU so that Fetal Heart Rate can be monitored

## 2019-02-20 NOTE — ED Notes (Signed)
Called for pt transport to MAU spoke with Ebony Hail

## 2019-02-20 NOTE — MAU Note (Signed)
Pt became very lethargic en route to [place].  [place] tech did not feel comfortable doing [place] there.  Will do it bedside in pt rm

## 2019-02-20 NOTE — ED Notes (Signed)
Paged OB rapid Per CRN

## 2019-02-20 NOTE — MAU Note (Signed)
Transferred up from ED via stretcher. IV in rt ACNS infusing, 500 left to count.

## 2019-02-20 NOTE — MAU Provider Note (Signed)
History     CSN: [id number]  Arrival date and time: 02/20/19 3016   First Provider Initiated Contact with Patient 02/20/19 1047      Chief Complaint  Patient presents with  . Flank Pain   HPI   Ms.  Karen Curry is a 23 y.o. year old G59P1001 female at [redacted]w[redacted]d weeks gestation who was transferred to MAU from [facility]. She presented to the ED with complaints of RT flank pain that started at 1900 on 02/20/2019. She describes the pain as "sharp like a needle". She had this same pain in May of this year last month when she had a renal U/S that found 2 simple renal cysts, the largest measuring up to 2.7 cm at the superior pole and an additional 0.8 cm cyst in the interpolar region on the RT side. She also has complaints of lower RT side back pain and N/V. She denies any abdominal pain, VB or LOF. She reports good (+) FM. She received Fentanyl 50 mcg by IV in the ED, was asleep when MAU provider entered the room and reports her pain a 8/10. Lab work done in the ED and Renal U/S ordered by ED, but not yet done.  Past Medical History:  Diagnosis Date  . Asthma   . Kidney stones    23 yo  . UTI (urinary tract infection)     Past Surgical History:  Procedure Laterality Date  . NO PAST SURGERIES      Family History  Problem Relation Age of Onset  . Hypertension Father   . Hypertension Mother     Social History   Tobacco Use  . Smoking status: Never Smoker  . Smokeless tobacco: Never Used  Substance Use Topics  . Alcohol use: No    Alcohol/week: 0.0 standard drinks  . Drug use: No    Allergies: No Known Allergies  Medications Prior to Admission  Medication Sig Dispense Refill Last Dose  . Prenatal Vit-Fe Fumarate-FA (PRENATAL 19) 29-1 MG CHEW Chew 1 tablet by mouth daily. 30 tablet 11 02/19/2019 at 0900  . oxyCODONE-acetaminophen (PERCOCET) 5-325 MG tablet Take 1 tablet by mouth every 6 (six) hours as needed for up to 3 doses for severe pain. 3 tablet 0     Review of Systems   Constitutional: Negative.   HENT: Negative.   Eyes: Negative.   Respiratory: Negative.   Cardiovascular: Negative.   Gastrointestinal: Positive for nausea and vomiting.  Endocrine: Negative.   Genitourinary: Negative.   Musculoskeletal: Positive for back pain (lower).  Skin: Negative.   Allergic/Immunologic: Negative.   Neurological: Negative.   Hematological: Negative.   Psychiatric/Behavioral: Negative.    Physical Exam   Blood pressure 100/63, pulse (!) 122, temperature 99.7 F (37.6 C), temperature source Oral, resp. rate 20, last menstrual period 07/29/2018, SpO2 98 %.  Physical Exam  Nursing note and vitals reviewed. Constitutional: She is oriented to person, place, and time. She appears well-developed and well-nourished.  HENT:  Head: Normocephalic and atraumatic.  Eyes: Pupils are equal, round, and reactive to light. Conjunctivae are normal.  Neck: Normal range of motion. Neck supple.  Cardiovascular: Normal rate.  Respiratory: Effort normal.  GI: Soft. Bowel sounds are normal. There is CVA tenderness (on RT only).  Genitourinary:    Genitourinary Comments: Dilation: Closed/Thick/Posterior/High Exam by:: Carloyn Jaeger, CNM    Musculoskeletal: Normal range of motion.  Neurological: She is alert and oriented to person, place, and time.  Skin: Skin is warm and dry.  Psychiatric:  She has a normal mood and affect. Her behavior is normal. Judgment and thought content normal.    MAU Course  Procedures  MDM IV already indwelling Dialudid 2 mg IVP - reduced pain from an 8 to a 6 Toradol 30 mg IVP - improved pain a little more Renal U/S UCx pending form [facility]  *Consult with Dr. Alysia Penna @ 1344 - notified of patient's complaints, assessments, lab, U/S, & NST results, recommended tx plan treat for kidney stones even though imaging and tests don't support that dx (Rx Flomax, Percocet and strain every urine) and have patient seen by OB this week, give Toradol 30 mg IVP before she  leaves today - ok to d/c home  Results for orders placed or performed during the hospital encounter of 02/20/19 (from the past 24 hour(s))  Urinalysis, Routine w reflex microscopic     Status: Abnormal   Collection Time: 02/20/19  8:33 AM  Result Value Ref Range   Color, Urine YELLOW YELLOW   APPearance CLEAR CLEAR   Specific Gravity, Urine 1.008 1.005 - 1.030   pH 7.0 5.0 - 8.0   Glucose, UA NEGATIVE NEGATIVE mg/dL   Hgb urine dipstick NEGATIVE NEGATIVE   Bilirubin Urine NEGATIVE NEGATIVE   Ketones, ur NEGATIVE NEGATIVE mg/dL   Protein, ur NEGATIVE NEGATIVE mg/dL   Nitrite NEGATIVE NEGATIVE   Leukocytes,Ua SMALL (A) NEGATIVE   RBC / HPF 0-5 0 - 5 RBC/hpf   WBC, UA 6-10 0 - 5 WBC/hpf   Bacteria, UA MANY (A) NONE SEEN  Comprehensive metabolic panel     Status: Abnormal   Collection Time: 02/20/19  8:33 AM  Result Value Ref Range   Sodium 132 (L) 135 - 145 mmol/L   Potassium 3.6 3.5 - 5.1 mmol/L   Chloride 105 98 - 111 mmol/L   CO2 22 22 - 32 mmol/L   Glucose, Bld 94 70 - 99 mg/dL   BUN 7 6 - 20 mg/dL   Creatinine, Ser 5.64 0.44 - 1.00 mg/dL   Calcium 8.6 (L) 8.9 - 10.3 mg/dL   Total Protein 6.5 6.5 - 8.1 g/dL   Albumin 2.2 (L) 3.5 - 5.0 g/dL   AST 15 15 - 41 U/L   ALT 12 0 - 44 U/L   Alkaline Phosphatase 68 38 - 126 U/L   Total Bilirubin 0.5 0.3 - 1.2 mg/dL   GFR calc non Af Amer >60 >60 mL/min   GFR calc Af Amer >60 >60 mL/min   Anion gap 5 5 - 15  CBC with Differential     Status: Abnormal   Collection Time: 02/20/19  8:33 AM  Result Value Ref Range   WBC 8.0 4.0 - 10.5 K/uL   RBC 4.35 3.87 - 5.11 MIL/uL   Hemoglobin 12.6 12.0 - 15.0 g/dL   HCT 33.2 95.1 - 88.4 %   MCV 84.8 80.0 - 100.0 fL   MCH 29.0 26.0 - 34.0 pg   MCHC 34.1 30.0 - 36.0 g/dL   RDW 16.6 06.3 - 01.6 %   Platelets 199 150 - 400 K/uL   nRBC 0.0 0.0 - 0.2 %   Neutrophils Relative % 92 %   Neutro Abs 7.3 1.7 - 7.7 K/uL   Lymphocytes Relative 5 %   Lymphs Abs 0.4 (L) 0.7 - 4.0 K/uL   Monocytes  Relative 1 %   Monocytes Absolute 0.1 0.1 - 1.0 K/uL   Eosinophils Relative 0 %   Eosinophils Absolute 0.0 0.0 - 0.5 K/uL  Basophils Relative 0 %   Basophils Absolute 0.0 0.0 - 0.1 K/uL   Immature Granulocytes 2 %   Abs Immature Granulocytes 0.15 (H) 0.00 - 0.07 K/uL    US Renal  Result Date: 02/20/2019 CLINICAL DATA:  Right flank pain. EXAM: RENAL / URINARY TRACT ULTRASOUND COMPLETE COMPARISON:  January 15, 2019 FINDINGS: Right Kidney: Renal measurements: 13.4 x 5 x 6.8 cm = volume: 253 mL. There is no hydronephrosis. Again noted is a 3.9 cm cyst in the upper pole. Left Kidney: Renal measurements: 14 x 5.5 x 6.1 cm = volume: 249 mL. Again noted is mild-to-moderate left-sided hydronephrosis which is slightly worsened when compared to prior exam. Bladder: The bladder wall appears thickened.  Both ureteral jets were noted. IMPRESSION: 1. Mild-to-moderate left-sided hydronephrosis, slightly worsened when compared to prior exam. 2. No right-sided hydronephrosis. 3. Both ureteral jets were visualized. Electronically Signed   By: Constance Holster M.D.   On: 02/20/2019 12:13    Assessment and Plan  Acute right flank pain  - Rx Flomax 0.8 mg BID - Rx Percocet 5/325 mg every 6 hrs prn pain - Instructions for straining urine reviewed - Discharge patient - TC to [facility] to get patient scheduled for F/U on 9/24 or 9/25  note faxed to office - Patient verbalized an understanding of the plan of care and agrees.     Laury Deep, MSN, CNM 02/20/2019, 10:54 AM

## 2019-02-22 LAB — URINE CULTURE: Culture: 80000 — AB

## 2019-04-16 ENCOUNTER — Encounter (HOSPITAL_COMMUNITY): Payer: Self-pay

## 2019-04-16 ENCOUNTER — Inpatient Hospital Stay (HOSPITAL_COMMUNITY)
Admission: AD | Admit: 2019-04-16 | Discharge: 2019-04-16 | Disposition: A | Discharge status: Home / Self Care | Payer: Medicaid Other | Attending: Obstetrics and Gynecology | Admitting: Obstetrics and Gynecology

## 2019-04-16 ENCOUNTER — Other Ambulatory Visit: Payer: Self-pay

## 2019-04-16 DIAGNOSIS — Z87442 Personal history of urinary calculi: Secondary | ICD-10-CM | POA: Insufficient documentation

## 2019-04-16 DIAGNOSIS — O26893 Other specified pregnancy related conditions, third trimester: Secondary | ICD-10-CM | POA: Diagnosis present

## 2019-04-16 DIAGNOSIS — O99513 Diseases of the respiratory system complicating pregnancy, third trimester: Secondary | ICD-10-CM | POA: Insufficient documentation

## 2019-04-16 DIAGNOSIS — O2343 Unspecified infection of urinary tract in pregnancy, third trimester: Secondary | ICD-10-CM | POA: Insufficient documentation

## 2019-04-16 DIAGNOSIS — Z3A37 37 weeks gestation of pregnancy: Secondary | ICD-10-CM

## 2019-04-16 DIAGNOSIS — R030 Elevated blood-pressure reading, without diagnosis of hypertension: Secondary | ICD-10-CM | POA: Insufficient documentation

## 2019-04-16 DIAGNOSIS — J45909 Unspecified asthma, uncomplicated: Secondary | ICD-10-CM | POA: Insufficient documentation

## 2019-04-16 DIAGNOSIS — Z87440 Personal history of urinary (tract) infections: Secondary | ICD-10-CM | POA: Diagnosis not present

## 2019-04-16 LAB — URINALYSIS, ROUTINE W REFLEX MICROSCOPIC
Bilirubin Urine: NEGATIVE
Glucose, UA: NEGATIVE mg/dL
Hgb urine dipstick: NEGATIVE
Ketones, ur: NEGATIVE mg/dL
Nitrite: POSITIVE — AB
Protein, ur: 30 mg/dL — AB
Specific Gravity, Urine: 1.024 (ref 1.005–1.030)
pH: 6 (ref 5.0–8.0)

## 2019-04-16 LAB — PROTEIN / CREATININE RATIO, URINE
Creatinine, Urine: 163.54 mg/dL
Protein Creatinine Ratio: 0.18 mg/mg{Cre} — ABNORMAL HIGH (ref 0.00–0.15)
Total Protein, Urine: 30 mg/dL

## 2019-04-16 LAB — COMPREHENSIVE METABOLIC PANEL
ALT: 11 U/L (ref 0–44)
AST: 13 U/L — ABNORMAL LOW (ref 15–41)
Albumin: 2.6 g/dL — ABNORMAL LOW (ref 3.5–5.0)
Alkaline Phosphatase: 63 U/L (ref 38–126)
Anion gap: 11 (ref 5–15)
BUN: 7 mg/dL (ref 6–20)
CO2: 20 mmol/L — ABNORMAL LOW (ref 22–32)
Calcium: 9.1 mg/dL (ref 8.9–10.3)
Chloride: 104 mmol/L (ref 98–111)
Creatinine, Ser: 0.53 mg/dL (ref 0.44–1.00)
GFR calc Af Amer: >60 mL/min (ref >60)
GFR calc non Af Amer: >60 mL/min (ref >60)
Glucose, Bld: 82 mg/dL (ref 70–99)
Potassium: 3.8 mmol/L (ref 3.5–5.1)
Sodium: 135 mmol/L (ref 135–145)
Total Bilirubin: 0.6 mg/dL (ref 0.3–1.2)
Total Protein: 7.5 g/dL (ref 6.5–8.1)

## 2019-04-16 LAB — CBC
HCT: 38.3 % (ref 36.0–46.0)
Hemoglobin: 12.6 g/dL (ref 12.0–15.0)
MCH: 27.5 pg (ref 26.0–34.0)
MCHC: 32.9 g/dL (ref 30.0–36.0)
MCV: 83.4 fL (ref 80.0–100.0)
Platelets: 275 10*3/uL (ref 150–400)
RBC: 4.59 MIL/uL (ref 3.87–5.11)
RDW: 15.6 % — ABNORMAL HIGH (ref 11.5–15.5)
WBC: 9.9 10*3/uL (ref 4.0–10.5)
nRBC: 0 % (ref 0.0–0.2)

## 2019-04-16 MED ORDER — CEPHALEXIN 500 MG PO CAPS: 500.0000 mg | ORAL_CAPSULE | Freq: Four times a day (QID) | ORAL | 0 refills | Status: DC

## 2019-04-16 MED ORDER — FENTANYL CITRATE (PF) 100 MCG/2ML IJ SOLN
50.0000 ug | Freq: Once | INTRAMUSCULAR | Status: AC
  Administered 2019-04-16: 50 ug via INTRAVENOUS
  Filled 2019-04-16: qty 2

## 2019-04-16 MED ORDER — ONDANSETRON HCL 4 MG/2ML IJ SOLN
4.0000 mg | Freq: Once | INTRAMUSCULAR | Status: AC
  Administered 2019-04-16: 4 mg via INTRAVENOUS
  Filled 2019-04-16: qty 2

## 2019-04-16 MED ORDER — SODIUM CHLORIDE 0.9 % IV SOLN
INTRAVENOUS | Status: DC
  Administered 2019-04-16: 21:00:00 via INTRAVENOUS

## 2019-04-16 MED ORDER — FENTANYL CITRATE (PF) 100 MCG/2ML IJ SOLN
100.0000 ug | Freq: Once | INTRAMUSCULAR | Status: AC
  Administered 2019-04-16: 21:00:00 100 ug via INTRAVENOUS
  Filled 2019-04-16: qty 2

## 2019-04-16 MED ORDER — SODIUM CHLORIDE 0.9 % IV SOLN
2.0000 g | Freq: Once | INTRAVENOUS | Status: AC
  Administered 2019-04-16: 21:00:00 2 g via INTRAVENOUS
  Filled 2019-04-16: qty 2

## 2019-04-16 NOTE — MAU Provider Note (Addendum)
Chief Complaint:  Contractions   First Provider Initiated Contact with Patient 04/16/19 2041     HPI: Karen Curry is a 23 y.o. G2P1001 at [redacted]w[redacted]d who presents to maternity admissions reporting low back pain & abdominal cramping. Symptoms started earlier today after taking a walk. Reports constant low back pain & intermittent abdominal cramping. Endorses some nausea, no vomiting.  Denies history of hypertension. Denies headache, visual disturbance, or epigastric pain. Denies dysuria, flank pain, fever/chills, hematuria. Denies LOF or vaginal bleeding. Good fetal movement.  History of UTIs.   Location: abdomen & back Quality: aching, cramping Severity: 7/10 in pain scale Duration: 1 day Timing: constant back pain, intermittent abdominal cramping Modifying factors: nothing makes better or worse. Hasn't treated symptoms Associated signs and symptoms: nausea  Pregnancy Course: [facility]. Denies complications with the pregnancy   Past Medical History:  Diagnosis Date  . Asthma   . Kidney stones    23 yo  . UTI (urinary tract infection)    OB History  Gravida Para Term Preterm AB Living  2 1 1  0 0 1  SAB TAB Ectopic Multiple Live Births  0 0 0 0 1    # Outcome Date GA Lbr Len/2nd Weight Sex Delivery Anes PTL Lv  2 Current           1 Term 08/05/14 [redacted]w[redacted]d 11:59 / 01:11 2500 g F Vag-Spont EPI, Local  LIV   Past Surgical History:  Procedure Laterality Date  . NO PAST SURGERIES     Family History  Problem Relation Age of Onset  . Hypertension Father   . Hypertension Mother    Social History   Tobacco Use  . Smoking status: Never Smoker  . Smokeless tobacco: Never Used  Substance Use Topics  . Alcohol use: No    Alcohol/week: 0.0 standard drinks  . Drug use: No   No Known Allergies Medications Prior to Admission  Medication Sig Dispense Refill Last Dose  . Prenatal Vit-Fe Fumarate-FA (PRENATAL 19) 29-1 MG CHEW Chew 1 tablet by mouth daily. 30 tablet 11     I  have reviewed patient's Past Medical Hx, Surgical Hx, Family Hx, Social Hx, medications and allergies.   ROS:  Review of Systems  Constitutional: Negative.   Eyes: Negative for visual disturbance.  Gastrointestinal: Positive for abdominal pain and nausea. Negative for constipation, diarrhea and vomiting.  Genitourinary: Negative.   Musculoskeletal: Positive for back pain.  Neurological: Negative for headaches.    Physical Exam   Patient Vitals for the past 24 hrs:  BP Temp Temp src Pulse Resp SpO2  04/16/19 2341 123/70 98.5 F (36.9 C) Oral 80 17 100 %  04/16/19 2200 120/72 - - 86 - 98 %  04/16/19 2130 118/71 - - 85 - 96 %  04/16/19 2100 127/69 - - 90 - 99 %  04/16/19 2057 - - - - - 99 %  04/16/19 2055 - - - - - 99 %  04/16/19 2046 119/85 - - 93 - -  04/16/19 2045 - - - - - 99 %  04/16/19 2040 - - - - - 99 %  04/16/19 2035 - - - - - 99 %  04/16/19 2031 129/89 - - 94 - -  04/16/19 2030 - - - - - 99 %  04/16/19 2024 (!) 149/81 - - 91 - -  04/16/19 1955 - - - - - 99 %  04/16/19 1952 (!) 148/90 - - 93 - -  04/16/19 1950 - - - - -  99 %  04/16/19 1945 - - - - - 99 %  04/16/19 1942 (!) 152/87 - - 94 - -    Constitutional: Well-developed, well-nourished female in no acute distress.  Cardiovascular: normal rate & rhythm, no murmur Respiratory: normal effort, lung sounds clear throughout GI: No CVATAbd soft, non-tender, gravid appropriate for gestational age. Pos BS x 4 MS: Extremities nontender, no edema, normal ROM Neurologic: Alert and oriented x 4. DTR 2+, no clonus GU:    Dilation: 1.5 Effacement (%): 20 Cervical Position: Posterior Station: Ballotable Presentation: Vertex Exam by:: Bari Mantis RN   NST:  Baseline: 125 bpm, Variability: Good {> 6 bpm), Accelerations: Reactive and Decelerations: Absent   Labs: Results for orders placed or performed during the hospital encounter of 04/16/19 (from the past 24 hour(s))  Urinalysis, Routine w reflex microscopic     Status:  Abnormal   Collection Time: 04/16/19  7:50 PM  Result Value Ref Range   Color, Urine YELLOW YELLOW   APPearance HAZY (A) CLEAR   Specific Gravity, Urine 1.024 1.005 - 1.030   pH 6.0 5.0 - 8.0   Glucose, UA NEGATIVE NEGATIVE mg/dL   Hgb urine dipstick NEGATIVE NEGATIVE   Bilirubin Urine NEGATIVE NEGATIVE   Ketones, ur NEGATIVE NEGATIVE mg/dL   Protein, ur 30 (A) NEGATIVE mg/dL   Nitrite POSITIVE (A) NEGATIVE   Leukocytes,Ua TRACE (A) NEGATIVE   RBC / HPF 0-5 0 - 5 RBC/hpf   WBC, UA 0-5 0 - 5 WBC/hpf   Bacteria, UA MANY (A) NONE SEEN   Squamous Epithelial / LPF 0-5 0 - 5   Mucus PRESENT   Protein / creatinine ratio, urine     Status: Abnormal   Collection Time: 04/16/19  8:00 PM  Result Value Ref Range   Creatinine, Urine 163.54 mg/dL   Total Protein, Urine 30 mg/dL   Protein Creatinine Ratio 0.18 (H) 0.00 - 0.15 mg/mg[Cre]  CBC     Status: Abnormal   Collection Time: 04/16/19  8:30 PM  Result Value Ref Range   WBC 9.9 4.0 - 10.5 K/uL   RBC 4.59 3.87 - 5.11 MIL/uL   Hemoglobin 12.6 12.0 - 15.0 g/dL   HCT 81.1 57.2 - 62.0 %   MCV 83.4 80.0 - 100.0 fL   MCH 27.5 26.0 - 34.0 pg   MCHC 32.9 30.0 - 36.0 g/dL   RDW 35.5 (H) 97.4 - 16.3 %   Platelets 275 150 - 400 K/uL   nRBC 0.0 0.0 - 0.2 %  Comprehensive metabolic panel     Status: Abnormal   Collection Time: 04/16/19  8:30 PM  Result Value Ref Range   Sodium 135 135 - 145 mmol/L   Potassium 3.8 3.5 - 5.1 mmol/L   Chloride 104 98 - 111 mmol/L   CO2 20 (L) 22 - 32 mmol/L   Glucose, Bld 82 70 - 99 mg/dL   BUN 7 6 - 20 mg/dL   Creatinine, Ser 8.45 0.44 - 1.00 mg/dL   Calcium 9.1 8.9 - 36.4 mg/dL   Total Protein 7.5 6.5 - 8.1 g/dL   Albumin 2.6 (L) 3.5 - 5.0 g/dL   AST 13 (L) 15 - 41 U/L   ALT 11 0 - 44 U/L   Alkaline Phosphatase 63 38 - 126 U/L   Total Bilirubin 0.6 0.3 - 1.2 mg/dL   GFR calc non Af Amer >60 >60 mL/min   GFR calc Af Amer >60 >60 mL/min   Anion gap 11 5 -  15    Imaging:  No results found.  MAU  Course: Orders Placed This Encounter  Procedures  . Culture, OB Urine  . Urinalysis, Routine w reflex microscopic  . CBC  . Comprehensive metabolic panel  . Protein / creatinine ratio, urine  . Measure blood pressure   Meds ordered this encounter  Medications  . DISCONTD: 0.9 %  sodium chloride infusion  . ondansetron (ZOFRAN) injection 4 mg  . fentaNYL (SUBLIMAZE) injection 100 mcg  . cefTRIAXone (ROCEPHIN) 2 g in sodium chloride 0.9 % 100 mL IVPB    Order Specific Question:   Antibiotic Indication:    Answer:   UTI  . fentaNYL (SUBLIMAZE) injection 50 mcg  . cephALEXin (KEFLEX) 500 MG capsule    Sig: Take 1 capsule (500 mg total) by mouth 4 (four) times daily.    Dispense:  28 capsule    Refill:  0    Order Specific Question:   Supervising Provider    Answer:   Conan BowensDAVIS, KELLY M [[id number]]    MDM: Reactive NST. Irregular contractions on monitor. Cervix unchanged from the office  U/a with + nitrites. No CVAT & pt afebrile. Pt appears very uncomfortable. Will give IV ceftriaxone in MAU while waiting for labs. Pt also requesting pain medication. Fentanyl ordered.   Elevated BPs. Pt denies history of hypertension & is asymptomatic. BPs no severe range. PEC labs pending.   [facility]  Judeth HornLawrence, Erin, NP 04/16/2019 8:41 PM  Labs pending, introduced self to patient  Patient reports pain has decreased to 2/10  Reassessment at 2245: results of CBC, CMP and PCR reviewed- PEC labs negative  Patient request for additional pain medication prior to discharge home   Educated and discussed warning signs of PEC and reasons to return to MAU. Rx for Keflex sent to pharmacy of choice  Discussed reasons to return to MAU. Follow up as scheduled in the office tomorrow for BP check. Return to MAU as needed. Pt stable at time of discharge.   ASSESSMENT AND PLAN:  1. Urinary tract infection in mother during third trimester of pregnancy   2. [redacted] weeks gestation of  pregnancy   3. Elevated blood-pressure reading without diagnosis of hypertension    Discharge home Follow up as scheduled in the office for prenatal care and BP check in office tomorrow  Return to MAU as needed for reasons discussed and/or emergencies  PEC precautions  Rx for Keflex for UTI   Follow-up Information    [facility]   Why: Follow up as scheduled for prenatal appointment  Contact information: [address] [place] [address] [phone]          Allergies as of 04/16/2019   No Known Allergies     Medication List    TAKE these medications   cephALEXin 500 MG capsule Commonly known as: KEFLEX Take 1 capsule (500 mg total) by mouth 4 (four) times daily.   Prenatal 19 29-1 MG Chew Chew 1 tablet by mouth daily.      Sharyon CableVeronica C Griffey Nicasio, CNM 04/16/19, 11:44 PM

## 2019-04-16 NOTE — MAU Note (Signed)
PT began having cntx, back pain and lower abdominal discomfort after taking a walk today. She is has a prenatal visit tomorrow. Missed last visit d/t car trouble. Denies bleeding or LOF. +FM

## 2019-04-18 LAB — CULTURE, OB URINE: Culture: >=100000 — AB

## 2019-04-25 ENCOUNTER — Inpatient Hospital Stay (HOSPITAL_COMMUNITY): Payer: Medicaid Other | Admitting: Anesthesiology

## 2019-04-25 ENCOUNTER — Inpatient Hospital Stay (HOSPITAL_COMMUNITY)
Admission: AD | Admit: 2019-04-25 | Discharge: 2019-04-27 | DRG: 807 | Disposition: A | Discharge status: Home / Self Care | Payer: Medicaid Other | Attending: Obstetrics | Admitting: Obstetrics

## 2019-04-25 ENCOUNTER — Encounter (HOSPITAL_COMMUNITY): Payer: Self-pay

## 2019-04-25 DIAGNOSIS — E669 Obesity, unspecified: Secondary | ICD-10-CM | POA: Diagnosis present

## 2019-04-25 DIAGNOSIS — O26893 Other specified pregnancy related conditions, third trimester: Secondary | ICD-10-CM | POA: Diagnosis present

## 2019-04-25 DIAGNOSIS — Z87898 Personal history of other specified conditions: Secondary | ICD-10-CM

## 2019-04-25 DIAGNOSIS — Z3490 Encounter for supervision of normal pregnancy, unspecified, unspecified trimester: Secondary | ICD-10-CM

## 2019-04-25 DIAGNOSIS — R109 Unspecified abdominal pain: Secondary | ICD-10-CM

## 2019-04-25 DIAGNOSIS — Z20828 Contact with and (suspected) exposure to other viral communicable diseases: Secondary | ICD-10-CM | POA: Diagnosis present

## 2019-04-25 DIAGNOSIS — Z3A38 38 weeks gestation of pregnancy: Secondary | ICD-10-CM | POA: Diagnosis not present

## 2019-04-25 DIAGNOSIS — O99214 Obesity complicating childbirth: Secondary | ICD-10-CM | POA: Diagnosis present

## 2019-04-25 LAB — CBC
HCT: 39.5 % (ref 36.0–46.0)
Hemoglobin: 13 g/dL (ref 12.0–15.0)
MCH: 27.3 pg (ref 26.0–34.0)
MCHC: 32.9 g/dL (ref 30.0–36.0)
MCV: 83 fL (ref 80.0–100.0)
Platelets: 297 10*3/uL (ref 150–400)
RBC: 4.76 MIL/uL (ref 3.87–5.11)
RDW: 15.4 % (ref 11.5–15.5)
WBC: 8.6 10*3/uL (ref 4.0–10.5)
nRBC: 0 % (ref 0.0–0.2)

## 2019-04-25 LAB — TYPE AND SCREEN
ABO/RH(D): AB POS
Antibody Screen: NEGATIVE

## 2019-04-25 LAB — SARS CORONAVIRUS 2 BY RT PCR (HOSPITAL ORDER, PERFORMED IN ~~LOC~~ HOSPITAL LAB): SARS Coronavirus 2: NEGATIVE

## 2019-04-25 MED ORDER — PHENYLEPHRINE 40 MCG/ML (10ML) SYRINGE FOR IV PUSH (FOR BLOOD PRESSURE SUPPORT)
80.0000 ug | PREFILLED_SYRINGE | INTRAVENOUS | Status: DC | PRN
  Filled 2019-04-25: qty 10

## 2019-04-25 MED ORDER — LACTATED RINGERS IV SOLN: 500.0000 mL | Freq: Once | INTRAVENOUS | Status: DC

## 2019-04-25 MED ORDER — TERBUTALINE SULFATE 1 MG/ML IJ SOLN: 0.2500 mg | Freq: Once | INTRAMUSCULAR | Status: DC | PRN

## 2019-04-25 MED ORDER — OXYTOCIN 40 UNITS IN NORMAL SALINE INFUSION - SIMPLE MED
2.5000 [IU]/h | INTRAVENOUS | Status: DC
  Filled 2019-04-25: qty 1000

## 2019-04-25 MED ORDER — FENTANYL-BUPIVACAINE-NACL 0.5-0.125-0.9 MG/250ML-% EP SOLN
12.0000 mL/h | EPIDURAL | Status: DC | PRN
  Filled 2019-04-25: qty 250

## 2019-04-25 MED ORDER — LIDOCAINE HCL (PF) 1 % IJ SOLN: 30.0000 mL | INTRAMUSCULAR | Status: DC | PRN

## 2019-04-25 MED ORDER — FENTANYL CITRATE (PF) 100 MCG/2ML IJ SOLN
INTRAMUSCULAR | Status: AC
  Filled 2019-04-25: qty 2

## 2019-04-25 MED ORDER — EPHEDRINE 5 MG/ML INJ: 10.0000 mg | INTRAVENOUS | Status: DC | PRN

## 2019-04-25 MED ORDER — PHENYLEPHRINE 40 MCG/ML (10ML) SYRINGE FOR IV PUSH (FOR BLOOD PRESSURE SUPPORT): 80.0000 ug | PREFILLED_SYRINGE | INTRAVENOUS | Status: DC | PRN

## 2019-04-25 MED ORDER — DIPHENHYDRAMINE HCL 50 MG/ML IJ SOLN: 12.5000 mg | INTRAMUSCULAR | Status: DC | PRN

## 2019-04-25 MED ORDER — LIDOCAINE HCL (PF) 1 % IJ SOLN
INTRAMUSCULAR | Status: DC | PRN
  Administered 2019-04-25: 6 mL via EPIDURAL

## 2019-04-25 MED ORDER — SODIUM CHLORIDE (PF) 0.9 % IJ SOLN
INTRAMUSCULAR | Status: DC | PRN
  Administered 2019-04-25: 12 mL/h via EPIDURAL

## 2019-04-25 MED ORDER — OXYTOCIN BOLUS FROM INFUSION
500.0000 mL | Freq: Once | INTRAVENOUS | Status: AC
  Administered 2019-04-25: 500 mL via INTRAVENOUS

## 2019-04-25 MED ORDER — LACTATED RINGERS IV SOLN: INTRAVENOUS | Status: DC

## 2019-04-25 MED ORDER — OXYCODONE-ACETAMINOPHEN 5-325 MG PO TABS: 1.0000 | ORAL_TABLET | ORAL | Status: DC | PRN

## 2019-04-25 MED ORDER — ONDANSETRON HCL 4 MG/2ML IJ SOLN: 4.0000 mg | Freq: Four times a day (QID) | INTRAMUSCULAR | Status: DC | PRN

## 2019-04-25 MED ORDER — FENTANYL CITRATE (PF) 100 MCG/2ML IJ SOLN
50.0000 ug | INTRAMUSCULAR | Status: DC | PRN
  Administered 2019-04-25: 100 ug via INTRAVENOUS

## 2019-04-25 MED ORDER — FENTANYL-BUPIVACAINE-NACL 0.5-0.125-0.9 MG/250ML-% EP SOLN: 12.0000 mL/h | EPIDURAL | Status: DC | PRN

## 2019-04-25 MED ORDER — ACETAMINOPHEN 325 MG PO TABS: 650.0000 mg | ORAL_TABLET | ORAL | Status: DC | PRN

## 2019-04-25 MED ORDER — LACTATED RINGERS IV SOLN: 500.0000 mL | INTRAVENOUS | Status: DC | PRN

## 2019-04-25 MED ORDER — SOD CITRATE-CITRIC ACID 500-334 MG/5ML PO SOLN: 30.0000 mL | ORAL | Status: DC | PRN

## 2019-04-25 MED ORDER — OXYTOCIN 40 UNITS IN NORMAL SALINE INFUSION - SIMPLE MED: 1.0000 m[IU]/min | INTRAVENOUS | Status: DC

## 2019-04-25 MED ORDER — OXYCODONE-ACETAMINOPHEN 5-325 MG PO TABS: 2.0000 | ORAL_TABLET | ORAL | Status: DC | PRN

## 2019-04-25 NOTE — MAU Note (Addendum)
PT arrived via EMS at [redacted]w[redacted]d. She reports SROM at 1240 today. Clear fluid. Is reporting painful contractions every 2 mins. She reports falling out of bed at 6am onto buttocks. Cntx started at 0900. Pt asking for epidural, feeling rectal pressure. SVE by midwife 5/90-2.

## 2019-04-25 NOTE — MAU Note (Signed)
EMS arrival, srom clear fluid at 1242, ctx's started this morning.  Wanting pain meds. 2nd baby, reports neg GBS

## 2019-04-25 NOTE — H&P (Signed)
23 y.o. G2P1001 @ [redacted]w[redacted]d presents with c/o labor and rupture of membranes.  She was found to be grossly ruptured on exam.  Her pregnancy has been uncomplicated. Otherwise has good fetal movement and no bleeding.  Past Medical History:  Diagnosis Date  . Asthma   . Kidney stones    23 yo  . UTI (urinary tract infection)     Past Surgical History:  Procedure Laterality Date  . NO PAST SURGERIES      OB History  Gravida Para Term Preterm AB Living  2 1 1  0 0 1  SAB TAB Ectopic Multiple Live Births  0 0 0 0 1    # Outcome Date GA Lbr Len/2nd Weight Sex Delivery Anes PTL Lv  2 Current           1 Term 08/05/14 [redacted]w[redacted]d 11:59 / 01:11 2500 g F Vag-Spont EPI, Local  LIV    Social History   Socioeconomic History  . Marital status: Single    Spouse name: Not on file  . Number of children: Not on file  . Years of education: Not on file  . Highest education level: Not on file  Occupational History  . Not on file  Social Needs  . Financial resource strain: Not on file  . Food insecurity    Worry: Never true    Inability: Never true  . Transportation needs    Medical: No    Non-medical: No  Tobacco Use  . Smoking status: Never Smoker  . Smokeless tobacco: Never Used  Substance and Sexual Activity  . Alcohol use: No    Alcohol/week: 0.0 standard drinks  . Drug use: No  . Sexual activity: Not Currently   Patient has no known allergies.    Prenatal Transfer Tool  Maternal Diabetes: No Genetic Screening: Normal NIPT low risk Maternal Ultrasounds/Referrals: Isolated EIF (echogenic intracardiac focus) Fetal Ultrasounds or other Referrals:  None Maternal Substance Abuse:  No Significant Maternal Medications:  None Significant Maternal Lab Results: Group B Strep negative  ABO, Rh: --/--/AB POS (11/25 1342) Antibody: NEG (11/25 1342) Rubella:  Immune RPR:   NR HBsAg:   Neg HIV:   Neg GBS:   Neg     Vitals:   04/25/19 1445 04/25/19 1450  BP: 124/79 (!) 122/103  Pulse:  100 86  Resp: 20   Temp:    SpO2:       General:  NAD Abdomen:  soft, gravid Ex:  trace edema SVE:  6/100/-2 FHTs:  120s, moderate variability, + accels, no decels Toco:  q3 minutes   A/P   23 y.o. G2P1001 [redacted]w[redacted]d presents with labor Admit to L&D Augmentation as needed FSR/ vtx/ GBS neg  [place]

## 2019-04-25 NOTE — Anesthesia Preprocedure Evaluation (Signed)
Anesthesia Evaluation  Patient identified by MRN, date of birth, ID band Patient awake    Reviewed: Allergy & Precautions, H&P , NPO status , Patient's Chart, lab work & pertinent test results, reviewed documented beta blocker date and time   Airway Mallampati: II  TM Distance: >3 FB Neck ROM: full    Dental no notable dental hx. (+) Teeth Intact, Dental Advisory Given   Pulmonary neg pulmonary ROS,    Pulmonary exam normal breath sounds clear to auscultation       Cardiovascular negative cardio ROS Normal cardiovascular exam Rhythm:regular Rate:Normal     Neuro/Psych negative neurological ROS  negative psych ROS   GI/Hepatic negative GI ROS, Neg liver ROS,   Endo/Other  negative endocrine ROS  Renal/GU negative Renal ROS  negative genitourinary   Musculoskeletal   Abdominal   Peds  Hematology negative hematology ROS (+)   Anesthesia Other Findings   Reproductive/Obstetrics (+) Pregnancy                             Anesthesia Physical Anesthesia Plan  ASA: III  Anesthesia Plan: Epidural   Post-op Pain Management:    Induction:   PONV Risk Score and Plan:   Airway Management Planned:   Additional Equipment:   Intra-op Plan:   Post-operative Plan:   Informed Consent: I have reviewed the patients History and Physical, chart, labs and discussed the procedure including the risks, benefits and alternatives for the proposed anesthesia with the patient or authorized representative who has indicated his/her understanding and acceptance.     Dental Advisory Given  Plan Discussed with:   Anesthesia Plan Comments: (Labs checked- platelets confirmed with RN in room. Fetal heart tracing, per RN, reported to be stable enough for sitting procedure. Discussed epidural, and patient consents to the procedure:  included risk of possible headache,backache, failed block, allergic reaction,  and nerve injury. This patient was asked if she had any questions or concerns before the procedure started.)        Anesthesia Quick Evaluation

## 2019-04-25 NOTE — Anesthesia Procedure Notes (Signed)
Epidural Patient location during procedure: OB Start time: 04/25/2019 2:35 PM End time: 04/25/2019 2:40 PM  Staffing Anesthesiologist: Janeece Riggers, MD  Preanesthetic Checklist Completed: patient identified, site marked, surgical consent, pre-op evaluation, timeout performed, IV checked, risks and benefits discussed and monitors and equipment checked  Epidural Patient position: sitting Prep: site prepped and draped and DuraPrep Patient monitoring: continuous pulse ox and blood pressure Approach: midline Location: L3-L4 Injection technique: LOR air  Needle:  Needle type: Tuohy  Needle gauge: 17 G Needle length: 9 cm and 9 Needle insertion depth: 9 cm Catheter type: closed end flexible Catheter size: 19 Gauge Catheter at skin depth: 13 cm Test dose: negative  Assessment Events: blood not aspirated, injection not painful, no injection resistance, negative IV test and no paresthesia

## 2019-04-26 ENCOUNTER — Encounter (HOSPITAL_COMMUNITY): Payer: Self-pay

## 2019-04-26 LAB — CBC
HCT: 32.7 % — ABNORMAL LOW (ref 36.0–46.0)
Hemoglobin: 10.6 g/dL — ABNORMAL LOW (ref 12.0–15.0)
MCH: 27.5 pg (ref 26.0–34.0)
MCHC: 32.4 g/dL (ref 30.0–36.0)
MCV: 84.9 fL (ref 80.0–100.0)
Platelets: 258 10*3/uL (ref 150–400)
RBC: 3.85 MIL/uL — ABNORMAL LOW (ref 3.87–5.11)
RDW: 15.7 % — ABNORMAL HIGH (ref 11.5–15.5)
WBC: 14.7 10*3/uL — ABNORMAL HIGH (ref 4.0–10.5)
nRBC: 0 % (ref 0.0–0.2)

## 2019-04-26 LAB — RPR: RPR Ser Ql: NONREACTIVE

## 2019-04-26 MED ORDER — OXYCODONE HCL 5 MG PO TABS: 10.0000 mg | ORAL_TABLET | ORAL | Status: DC | PRN

## 2019-04-26 MED ORDER — BENZOCAINE-MENTHOL 20-0.5 % EX AERO: 1.0000 "application " | INHALATION_SPRAY | CUTANEOUS | Status: DC | PRN

## 2019-04-26 MED ORDER — ONDANSETRON HCL 4 MG/2ML IJ SOLN: 4.0000 mg | INTRAMUSCULAR | Status: DC | PRN

## 2019-04-26 MED ORDER — IBUPROFEN 600 MG PO TABS
600.0000 mg | ORAL_TABLET | Freq: Four times a day (QID) | ORAL | Status: DC
  Administered 2019-04-26 – 2019-04-27 (×6): 600 mg via ORAL
  Filled 2019-04-26 (×6): qty 1

## 2019-04-26 MED ORDER — PRENATAL MULTIVITAMIN CH
1.0000 | ORAL_TABLET | Freq: Every day | ORAL | Status: DC
  Administered 2019-04-26 – 2019-04-27 (×2): 1 via ORAL
  Filled 2019-04-26 (×2): qty 1

## 2019-04-26 MED ORDER — OXYCODONE HCL 5 MG PO TABS
5.0000 mg | ORAL_TABLET | ORAL | Status: DC | PRN
  Administered 2019-04-26: 5 mg via ORAL
  Filled 2019-04-26: qty 1

## 2019-04-26 MED ORDER — DIPHENHYDRAMINE HCL 25 MG PO CAPS: 25.0000 mg | ORAL_CAPSULE | Freq: Four times a day (QID) | ORAL | Status: DC | PRN

## 2019-04-26 MED ORDER — ONDANSETRON HCL 4 MG PO TABS: 4.0000 mg | ORAL_TABLET | ORAL | Status: DC | PRN

## 2019-04-26 MED ORDER — COCONUT OIL OIL: 1.0000 "application " | TOPICAL_OIL | Status: DC | PRN

## 2019-04-26 MED ORDER — TETANUS-DIPHTH-ACELL PERTUSSIS 5-2.5-18.5 LF-MCG/0.5 IM SUSP: 0.5000 mL | Freq: Once | INTRAMUSCULAR | Status: DC

## 2019-04-26 MED ORDER — WITCH HAZEL-GLYCERIN EX PADS: 1.0000 "application " | MEDICATED_PAD | CUTANEOUS | Status: DC | PRN

## 2019-04-26 MED ORDER — SENNOSIDES-DOCUSATE SODIUM 8.6-50 MG PO TABS
2.0000 | ORAL_TABLET | ORAL | Status: DC
  Administered 2019-04-26: 2 via ORAL
  Filled 2019-04-26 (×2): qty 2

## 2019-04-26 MED ORDER — ACETAMINOPHEN 325 MG PO TABS
650.0000 mg | ORAL_TABLET | ORAL | Status: DC | PRN
  Administered 2019-04-26: 650 mg via ORAL
  Filled 2019-04-26: qty 2

## 2019-04-26 MED ORDER — SIMETHICONE 80 MG PO CHEW: 80.0000 mg | CHEWABLE_TABLET | ORAL | Status: DC | PRN

## 2019-04-26 MED ORDER — DIBUCAINE (PERIANAL) 1 % EX OINT: 1.0000 "application " | TOPICAL_OINTMENT | CUTANEOUS | Status: DC | PRN

## 2019-04-26 NOTE — Lactation Note (Signed)
This note was copied from a baby's chart. Lactation Consultation Note  Patient Name: Karen Curry HRCBU'L Date: 04/26/2019 Reason for consult: Initial assessment;Early term 37-38.6wks P2.  Mom breastfed first baby for 2 years.  Newborn is 44 hours old and latching and feeding well per mom.  Reviewed feeding cues and instructed to feed with cues.  Baby is currently sleeping in crib.  Mom denies questions or concerns.  Breastfeeding consultation services information given and reviewed.  Encouraged to call out for assist prn.  Maternal Data Does the patient have breastfeeding experience prior to this delivery?: Yes  Feeding    LATCH Score                   Interventions    Lactation Tools Discussed/Used     Consult Status Consult Status: Follow-up Date: 04/27/19 Follow-up type: In-patient    Ave Filter 04/26/2019, 8:17 AM

## 2019-04-26 NOTE — Progress Notes (Signed)
Post Partum Day 1 Subjective: no complaints, up ad lib, voiding, tolerating PO and + flatus  Objective: Blood pressure 124/88, pulse 87, temperature 98.5 F (36.9 C), resp. rate 16, height 5\' 3"  (1.6 m), weight 101.2 kg, last menstrual period 07/29/2018, SpO2 100 %, unknown if currently breastfeeding.  Physical Exam:  General: alert, cooperative and appears stated age 23: appropriate Uterine Fundus: firm DVT Evaluation: No evidence of DVT seen on physical exam.  Recent Labs    04/25/19 1357 04/26/19 0613  HGB 13.0 10.6*  HCT 39.5 32.7*    Assessment/Plan: Plan for discharge tomorrow   LOS: 1 day   Vanessa Kick 04/26/2019, 11:09 AM

## 2019-04-26 NOTE — Anesthesia Postprocedure Evaluation (Signed)
Anesthesia Post Note  Patient: Karen Curry  Procedure(s) Performed: AN AD [place]     Patient location during evaluation: Mother Baby Anesthesia Type: Epidural Level of consciousness: awake and alert and oriented Pain management: satisfactory to patient Vital Signs Assessment: post-procedure vital signs reviewed and stable Respiratory status: respiratory function stable Cardiovascular status: stable Postop Assessment: no headache, no backache, epidural receding, patient able to bend at knees, no signs of nausea or vomiting and adequate PO intake Anesthetic complications: no    Last Vitals:  Vitals:   04/26/19 0136 04/26/19 0546  BP: 117/72 116/73  Pulse: 93 94  Resp: 18 20  Temp: 37.3 C 37 C  SpO2: 100% 100%    Last Pain:  Vitals:   04/26/19 0546  TempSrc: Oral  PainSc:    Pain Goal: Patients Stated Pain Goal: 4 (04/25/19 1400)                 [name]

## 2019-04-27 MED ORDER — ACETAMINOPHEN 325 MG PO TABS: 650.0000 mg | ORAL_TABLET | ORAL | 0 refills | Status: DC | PRN

## 2019-04-27 MED ORDER — DOCUSATE SODIUM 50 MG PO CAPS: 50.0000 mg | ORAL_CAPSULE | Freq: Two times a day (BID) | ORAL | 0 refills | Status: DC

## 2019-04-27 MED ORDER — OXYCODONE-ACETAMINOPHEN 5-325 MG PO TABS: 1.0000 | ORAL_TABLET | Freq: Three times a day (TID) | ORAL | 0 refills | Status: DC | PRN

## 2019-04-27 MED ORDER — IBUPROFEN 600 MG PO TABS: 600.0000 mg | ORAL_TABLET | Freq: Four times a day (QID) | ORAL | 0 refills | Status: DC

## 2019-04-27 MED ORDER — FERROUS SULFATE 325 (65 FE) MG PO TABS: 325.0000 mg | ORAL_TABLET | Freq: Every day | ORAL | 11 refills | Status: DC

## 2019-04-27 NOTE — Discharge Instructions (Signed)
Postpartum Care After Vaginal Delivery °This sheet gives you information about how to care for yourself from the time you deliver your baby to up to 6-12 weeks after delivery (postpartum period). Your health care provider may also give you more specific instructions. If you have problems or questions, contact your health care provider. °Follow these instructions at home: °Vaginal bleeding °· It is normal to have vaginal bleeding (lochia) after delivery. Wear a sanitary pad for vaginal bleeding and discharge. °? During the first week after delivery, the amount and appearance of lochia is often similar to a menstrual period. °? Over the next few weeks, it will gradually decrease to a dry, yellow-brown discharge. °? For most women, lochia stops completely by 4-6 weeks after delivery. Vaginal bleeding can vary from woman to woman. °· Change your sanitary pads frequently. Watch for any changes in your flow, such as: °? A sudden increase in volume. °? A change in color. °? Large blood clots. °· If you pass a blood clot from your vagina, save it and call your health care provider to discuss. Do not flush blood clots down the toilet before talking with your health care provider. °· Do not use tampons or douches until your health care provider says this is safe. °· If you are not breastfeeding, your period should return 6-8 weeks after delivery. If you are feeding your child breast milk only (exclusive breastfeeding), your period may not return until you stop breastfeeding. °Perineal care °· Keep the area between the vagina and the anus (perineum) clean and dry as told by your health care provider. Use medicated pads and pain-relieving sprays and creams as directed. °· If you had a cut in the perineum (episiotomy) or a tear in the vagina, check the area for signs of infection until you are healed. Check for: °? More redness, swelling, or pain. °? Fluid or blood coming from the cut or tear. °? Warmth. °? Pus or a bad  smell. °· You may be given a squirt bottle to use instead of wiping to clean the perineum area after you go to the bathroom. As you start healing, you may use the squirt bottle before wiping yourself. Make sure to wipe gently. °· To relieve pain caused by an episiotomy, a tear in the vagina, or swollen veins in the anus (hemorrhoids), try taking a warm sitz bath 2-3 times a day. A sitz bath is a warm water bath that is taken while you are sitting down. The water should only come up to your hips and should cover your buttocks. °Breast care °· Within the first few days after delivery, your breasts may feel heavy, full, and uncomfortable (breast engorgement). Milk may also leak from your breasts. Your health care provider can suggest ways to help relieve the discomfort. Breast engorgement should go away within a few days. °· If you are breastfeeding: °? Wear a bra that supports your breasts and fits you well. °? Keep your nipples clean and dry. Apply creams and ointments as told by your health care provider. °? You may need to use breast pads to absorb milk that leaks from your breasts. °? You may have uterine contractions every time you breastfeed for up to several weeks after delivery. Uterine contractions help your uterus return to its normal size. °? If you have any problems with breastfeeding, work with your health care provider or lactation consultant. °· If you are not breastfeeding: °? Avoid touching your breasts a lot. Doing this can make   your breasts produce more milk. ? Wear a good-fitting bra and use cold packs to help with swelling. ? Do not squeeze out (express) milk. This causes you to make more milk. Intimacy and sexuality  Ask your health care provider when you can engage in sexual activity. This may depend on: ? Your risk of infection. ? How fast you are healing. ? Your comfort and desire to engage in sexual activity.  You are able to get pregnant after delivery, even if you have not had  your period. If desired, talk with your health care provider about methods of birth control (contraception). Medicines Prescriptions Motrin 600mg  every 6 hours for pain Acetaminophen 650mg  every 6 hours for moderate pain Percocet (Oxycodone 5mg -acetaminophen 325mg ) every 4 hours for severe pain.  Make sure to not exceed acetaminophen 3000mg  every day.   Take over-the-counter and prescription medicines only as told by your health care provider.  If you were prescribed an antibiotic medicine, take it as told by your health care provider. Do not stop taking the antibiotic even if you start to feel better. Activity  Gradually return to your normal activities as told by your health care provider. Ask your health care provider what activities are safe for you.  Rest as much as possible. Try to rest or take a nap while your baby is sleeping. Eating and drinking   Drink enough fluid to keep your urine pale yellow.  Eat high-fiber foods every day. These may help prevent or relieve constipation. High-fiber foods include: ? Whole grain cereals and breads. ? Brown rice. ? Beans. ? Fresh fruits and vegetables.  Do not try to lose weight quickly by cutting back on calories.  Take your prenatal vitamins until your postpartum checkup or until your health care provider tells you it is okay to stop. Lifestyle  Do not use any products that contain nicotine or tobacco, such as cigarettes and e-cigarettes. If you need help quitting, ask your health care provider.  Do not drink alcohol, especially if you are breastfeeding. General instructions  Keep all follow-up visits for you and your baby as told by your health care provider. Most women visit their health care provider for a postpartum checkup within the first 3-6 weeks after delivery. Contact a health care provider if:  You feel unable to cope with the changes that your child brings to your life, and these feelings do not go away.  You feel  unusually sad or worried.  Your breasts become red, painful, or hard.  You have a fever.  You have trouble holding urine or keeping urine from leaking.  You have little or no interest in activities you used to enjoy.  You have not breastfed at all and you have not had a menstrual period for 12 weeks after delivery.  You have stopped breastfeeding and you have not had a menstrual period for 12 weeks after you stopped breastfeeding.  You have questions about caring for yourself or your baby.  You pass a blood clot from your vagina. Get help right away if:  You have chest pain.  You have difficulty breathing.  You have sudden, severe leg pain.  You have severe pain or cramping in your lower abdomen.  You bleed from your vagina so much that you fill more than one sanitary pad in one hour. Bleeding should not be heavier than your heaviest period.  You develop a severe headache.  You faint.  You have blurred vision or spots in your vision.  You have bad-smelling vaginal discharge.  You have thoughts about hurting yourself or your baby. If you ever feel like you may hurt yourself or others, or have thoughts about taking your own life, get help right away. You can go to the nearest emergency department or call:  Your local emergency services (911 in the [place]  A suicide crisis helpline, such as the National Suicide Prevention Lifeline at (630)848-2882. This is open 24 hours a day. Summary  The period of time right after you deliver your newborn up to 6-12 weeks after delivery is called the postpartum period.  Gradually return to your normal activities as told by your health care provider.  Keep all follow-up visits for you and your baby as told by your health care provider. This information is not intended to replace advice given to you by your health care provider. Make sure you discuss any questions you have with your health care provider. Document Released: 03/14/2007  Document Revised: 05/20/2017 Document Reviewed: 02/28/2017 Elsevier Patient Education  2020 [employer].

## 2019-04-27 NOTE — Discharge Summary (Signed)
OB Discharge Summary     Patient Name: Karen Curry DOB: Oct 11, 1995 MRN: [id number]  Date of admission: 04/25/2019 Delivering MD: Jerelyn Charles   Date of discharge: 04/27/2019  Admitting diagnosis: 40WKS RUPTURE Intrauterine pregnancy: [redacted]w[redacted]d     Secondary diagnosis:  Active Problems:   Obesity (BMI 30-39.9)   Normal labor   Spontaneous vaginal delivery  Additional problems: none     Discharge diagnosis: Term Pregnancy Delivered                                                                                                Post partum procedures:none  Augmentation: none  Complications: None  Hospital course:  Onset of Labor With Vaginal Delivery     23 y.o. yo [id number] at [redacted]w[redacted]d was admitted in Active Labor on 04/25/2019 she was 6cm and SROM'ed. Patient had an uncomplicated labor course, progressed along a normal labor curve.  Membrane Rupture Time/Date: 12:40 PM ,04/25/2019   Intrapartum Procedures: Episiotomy: None [1]                                         Lacerations:  None [1]  Patient had a delivery of a Viable infant. 04/25/2019  Information for the patient's newborn:  Anjeanette, Petzold [[id number]]  Delivery Method: Vaginal, Spontaneous(Filed from Delivery Summary)     Pateint had an uncomplicated postpartum course.  She is ambulating, tolerating a regular diet, passing flatus, and urinating well. Patient is discharged home in stable condition on 04/27/19.   Physical exam  Vitals:   04/26/19 0546 04/26/19 0903 04/26/19 1400 04/27/19 0544  BP: 116/73 124/88 (!) 108/56 (!) 94/50  Pulse: 94 87 79 88  Resp: 20 16 16 18   Temp: 98.6 F (37 C) 98.5 F (36.9 C) 98.7 F (37.1 C) 98.7 F (37.1 C)  TempSrc: Oral Oral Oral Oral  SpO2: 100% 100% 98% 99%  Weight:      Height:       General: alert and no distress Lochia: appropriate Uterine Fundus: firm DVT Evaluation: No evidence of DVT seen on physical exam.2} Labs: Lab Results  Component Value Date   WBC  14.7 (H) 04/26/2019   HGB 10.6 (L) 04/26/2019   HCT 32.7 (L) 04/26/2019   MCV 84.9 04/26/2019   PLT 258 04/26/2019   CMP Latest Ref Rng & Units 04/16/2019  Glucose 70 - 99 mg/dL 82  BUN 6 - 20 mg/dL 7  Creatinine 0.44 - 1.00 mg/dL 0.53  Sodium 135 - 145 mmol/L 135  Potassium 3.5 - 5.1 mmol/L 3.8  Chloride 98 - 111 mmol/L 104  CO2 22 - 32 mmol/L 20(L)  Calcium 8.9 - 10.3 mg/dL 9.1  Total Protein 6.5 - 8.1 g/dL 7.5  Total Bilirubin 0.3 - 1.2 mg/dL 0.6  Alkaline Phos 38 - 126 U/L 63  AST 15 - 41 U/L 13(L)  ALT 0 - 44 U/L 11    Discharge instruction: per After Visit Summary and "Baby and Me Booklet".  After visit meds:  Allergies as of 04/27/2019   No Known Allergies     Medication List    STOP taking these medications   cephALEXin 500 MG capsule Commonly known as: KEFLEX     TAKE these medications   acetaminophen 325 MG tablet Commonly known as: Tylenol Take 2 tablets (650 mg total) by mouth every 4 (four) hours as needed (for pain scale < 4).   docusate sodium 50 MG capsule Commonly known as: COLACE Take 1 capsule (50 mg total) by mouth 2 (two) times daily.   ferrous sulfate 325 (65 FE) MG tablet Commonly known as: FerrouSul Take 1 tablet (325 mg total) by mouth daily with breakfast.   ibuprofen 600 MG tablet Commonly known as: ADVIL Take 1 tablet (600 mg total) by mouth every 6 (six) hours.   oxyCODONE-acetaminophen 5-325 MG tablet Commonly known as: Percocet Take 1 tablet by mouth every 8 (eight) hours as needed for severe pain.   Prenatal 19 29-1 MG Chew Chew 1 tablet by mouth daily.       Diet: routine diet  Activity: Advance as tolerated. Pelvic rest for 6 weeks.   Outpatient follow up:4 weeks Follow up Appt:No future appointments. Follow up Visit:No follow-ups on file.  Postpartum contraception: defer until postpartum visit  Newborn Data: Live born female  Birth Weight: 6 lb 9.6 oz (2994 g) APGAR: 9, 9  Newborn Delivery   Birth  date/time: 04/25/2019 22:26:00 Delivery type: Vaginal, Spontaneous      Baby Feeding: Breast Disposition:home with mother   04/27/2019 Rande Brunt, MD

## 2019-04-27 NOTE — Lactation Note (Signed)
This note was copied from a baby's chart. Lactation Consultation Note Baby 4 hrs old. Mom stated BF good. Has no questions about BF. LC noted bottle of formula in rm. Mom stated the baby didn't like it and wouldn't take it. LC discussed supply and demand, feeding plans, pumping, hand expression, engorgement, milk storage, newborn feeding habits, I&O, STS, breast massage, breast care, and answered questions.  Mom stated she was ready to go home today after baby is circumcised.  Mom stated she didn't have any further questions for Lactation about BF. Mom demonstrated hand expression w/easy flow of colostrum noted. Mom doesn't have a pump at home so hand pump given. Reviewed out pt. [facility].  Patient Name: Karen Curry [id number] Date: 04/27/2019 Reason for consult: Follow-up assessment;Primapara;Early term 37-38.6wks   Maternal Data    Feeding Feeding Type: Breast Fed  LATCH Score       Type of Nipple: Everted at rest and after stimulation(short shaft)  Comfort (Breast/Nipple): Soft / non-tender        Interventions Interventions: Breast feeding basics reviewed;Breast massage;Hand express;Breast compression;Hand pump;Pre-pump if needed  Lactation Tools Discussed/Used Tools: Pump Breast pump type: Manual WIC Program: Yes Pump Review: Setup, frequency, and cleaning;Milk Storage Initiated by:: Allayne Stack RN IBCLC Date initiated:: 04/27/19   Consult Status Consult Status: Complete Date: 04/27/19    Theodoro Kalata 04/27/2019, 3:12 AM

## 2019-04-27 NOTE — Progress Notes (Signed)
Post Partum Day 2 Subjective: no complaints, up ad lib, voiding and tolerating PO  Objective: Patient Vitals for the past 24 hrs:  BP Temp Temp src Pulse Resp SpO2  04/27/19 0544 (!) 94/50 98.7 F (37.1 C) Oral 88 18 99 %  04/26/19 1400 (!) 108/56 98.7 F (37.1 C) Oral 79 16 98 %  04/26/19 0903 124/88 98.5 F (36.9 C) Oral 87 16 100 %    Physical Exam:  General: alert and no distress Lochia: appropriate Uterine Fundus: firm DVT Evaluation: No evidence of DVT seen on physical exam.  Recent Labs    04/25/19 1357 04/26/19 0613  WBC 8.6 14.7*  HGB 13.0 10.6*  HCT 39.5 32.7*  PLT 297 258  Assessment/Plan: Discharge home  Karen Curry 23 y.o. G1P1001 PPD#2 sp SVD at [redacted]w[redacted]d  1. PPC: meeting goals 2. Obesity BMI 39.5 3. Circ for baby: Desires neonatal circumcision, R/B/A of procedure discussed at length. Pt understands that neonatal circumcision is not considered medically necessary and is elective. The risks include, but are not limited to bleeding, infection, damage to the penis, development of scar tissue, and having to have it redone at a later date. Pt understands theses risks and wishes to proceed Rubella immune, AB POS, breastfeeding, baby with mom   LOS: 2 days   Karen Curry 04/27/2019, 8:55 AM

## 2019-10-21 IMAGING — CR DG CHEST 2V
2 series · 2 of 2 positions shown · non-contrast
Comparison: None.

CLINICAL DATA: Chest pain.  Positive PPD.

EXAM:
CHEST - 2 VIEW

[chest pa]
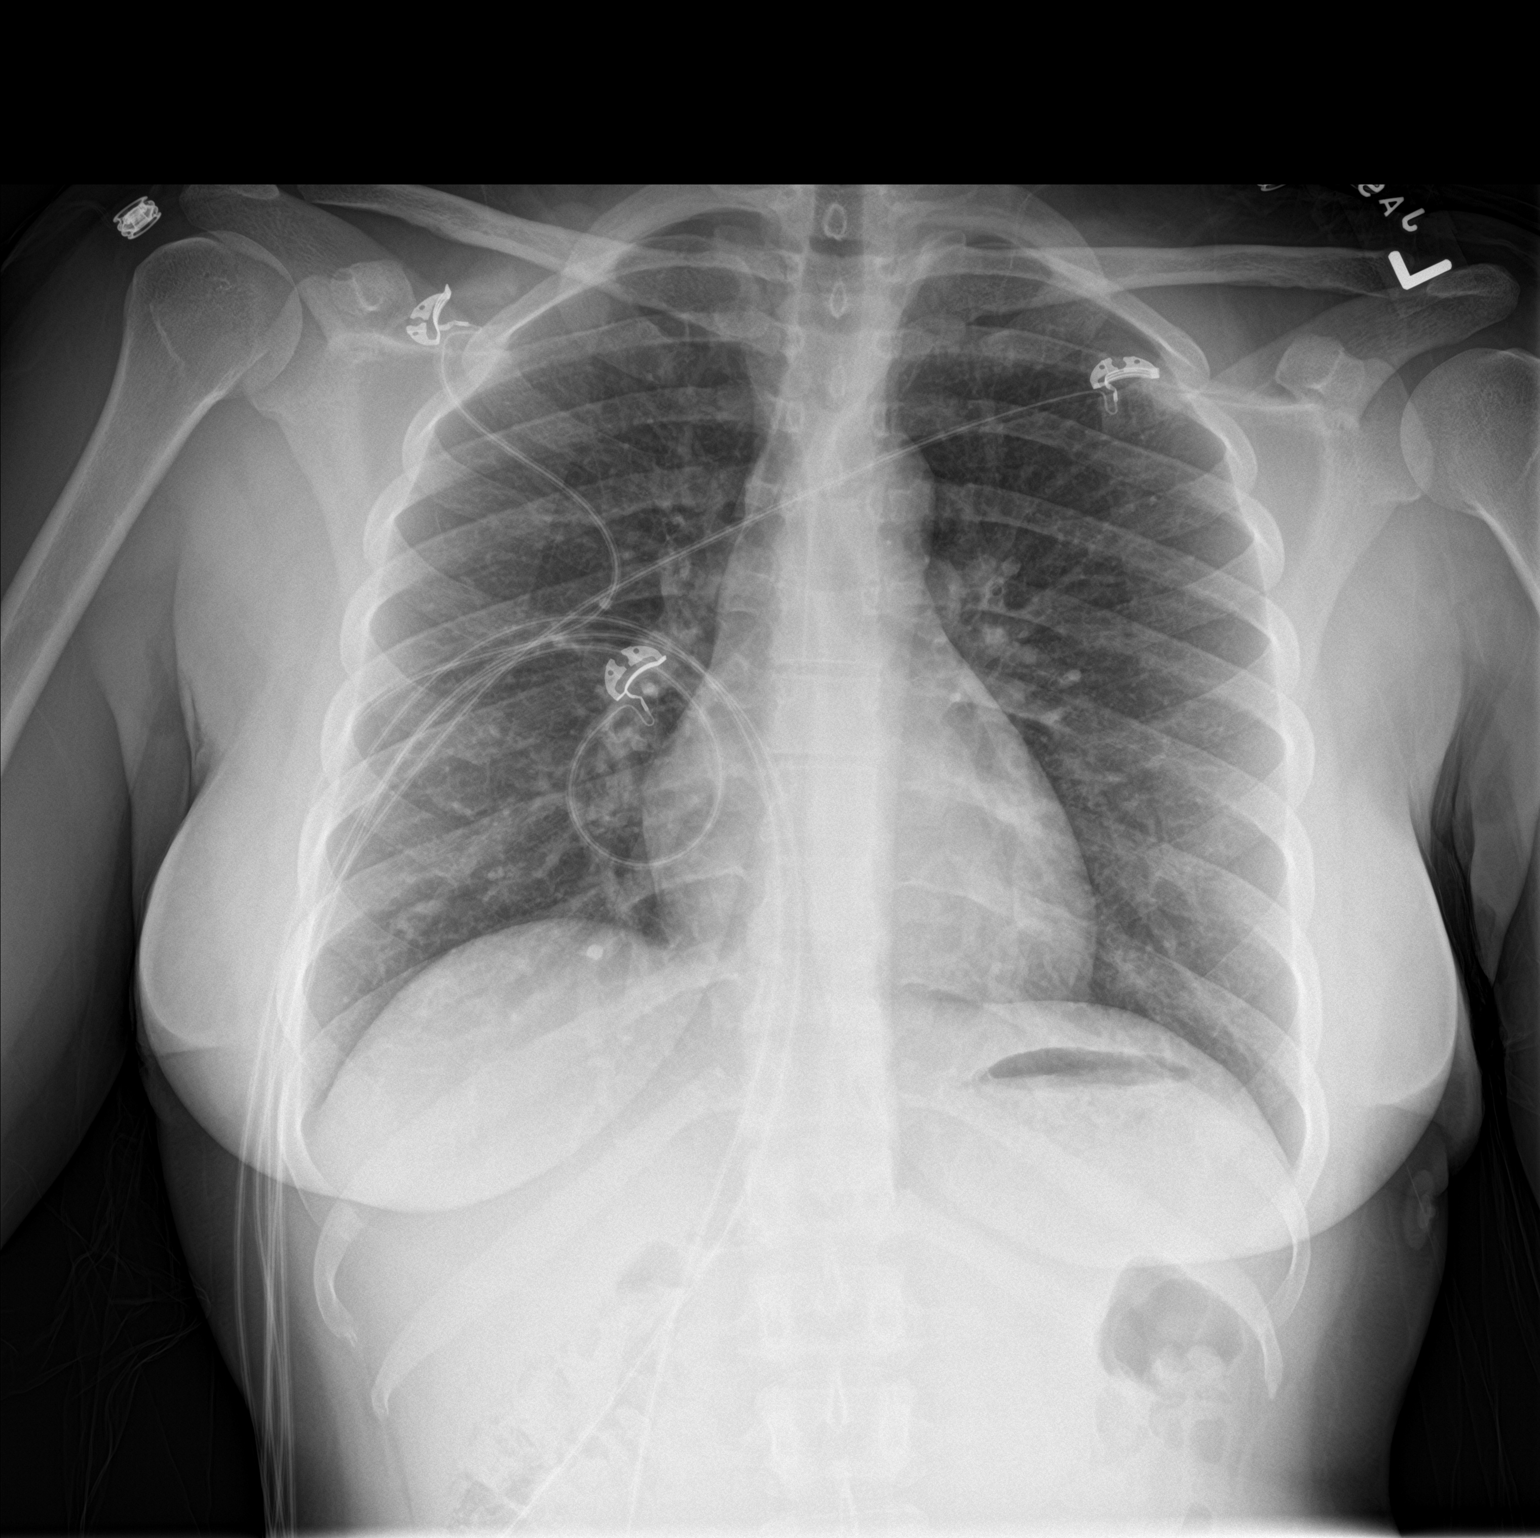

[chest lat]
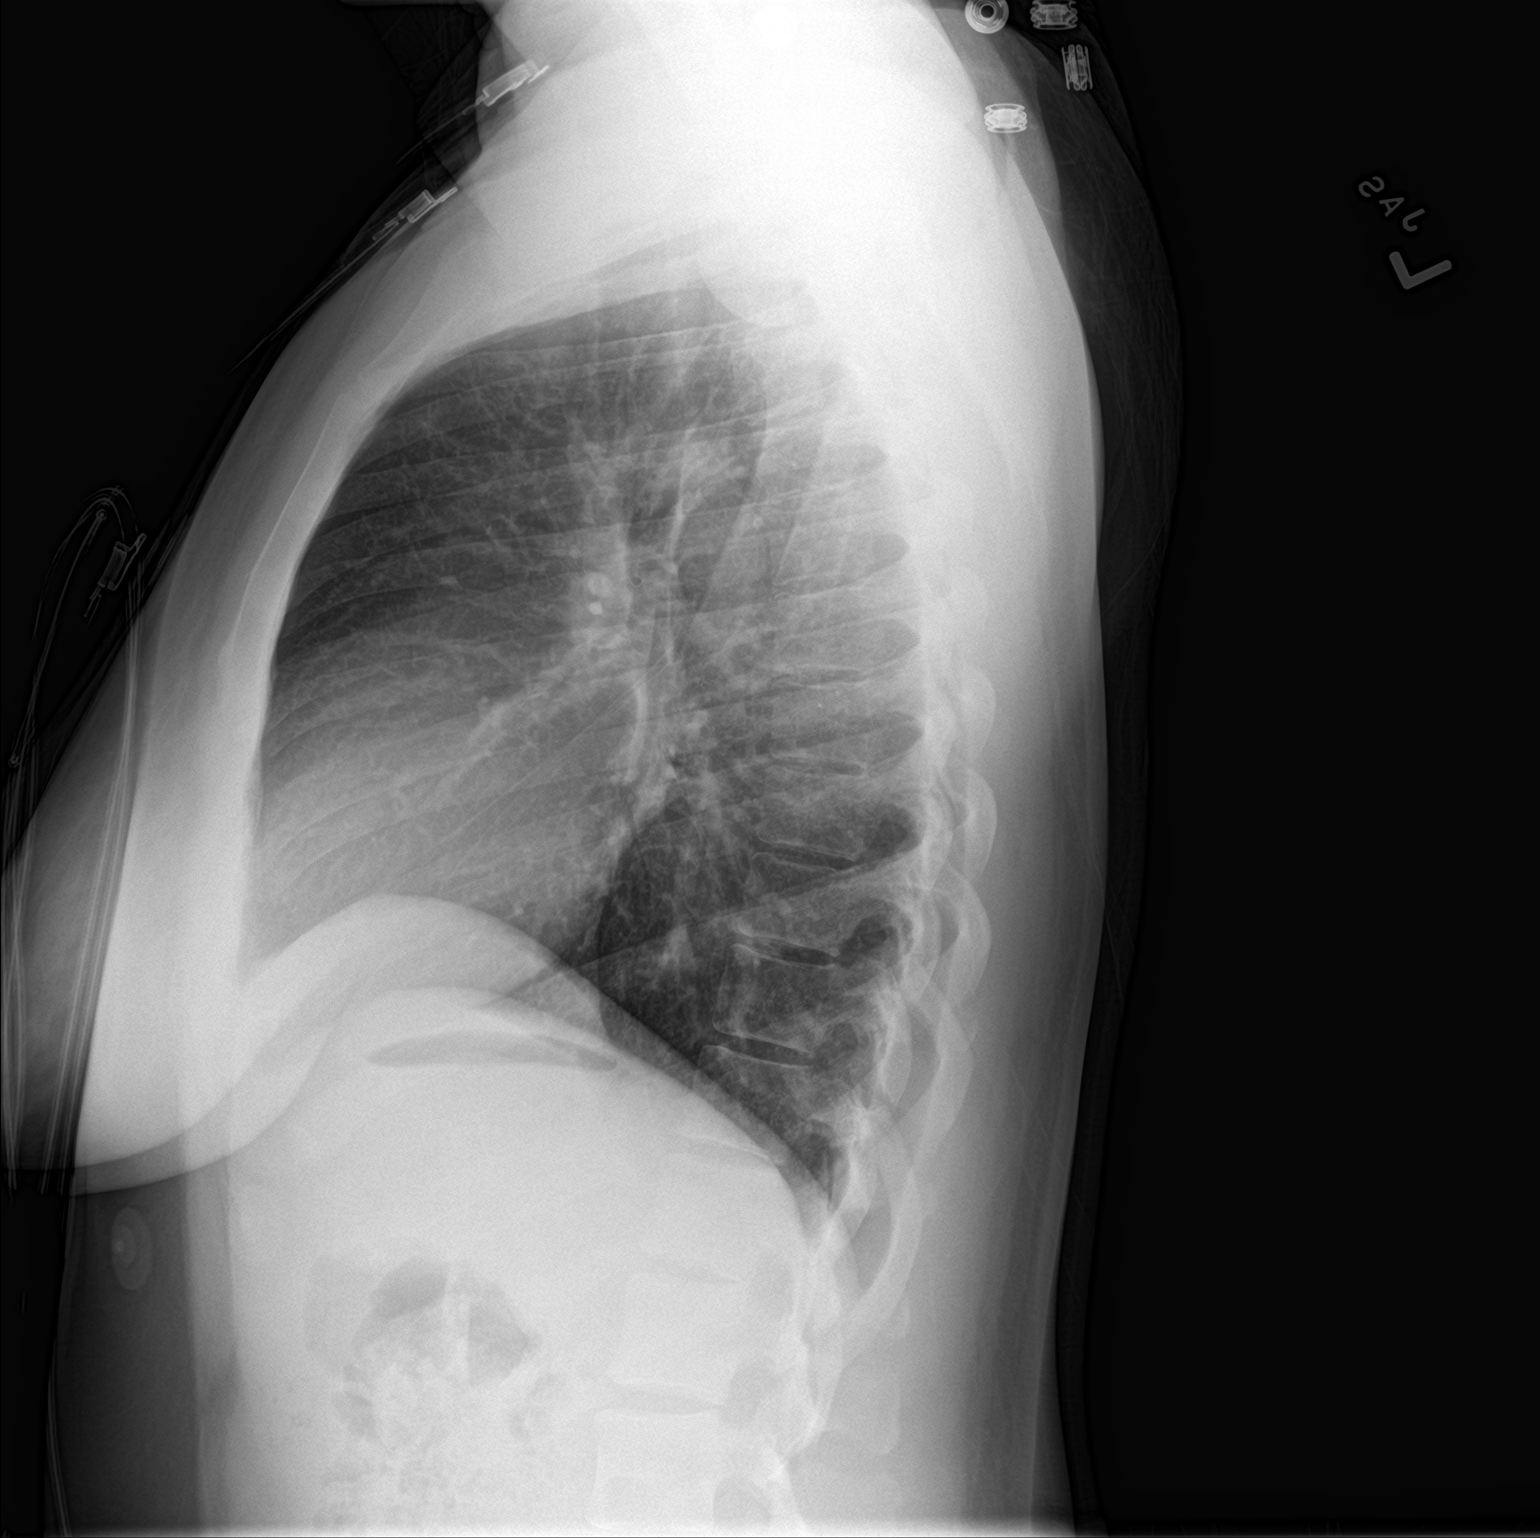

[2 of 2 positions shown; findings below may reference images not displayed]

FINDINGS: Cardiomediastinal silhouette is normal. No pleural effusions or
focal consolidations. Trachea projects midline and there is no
pneumothorax. Soft tissue planes and included osseous structures are
non-suspicious.
IMPRESSION: Normal chest.

## 2019-12-16 ENCOUNTER — Encounter (HOSPITAL_COMMUNITY): Payer: Self-pay | Admitting: Emergency Medicine

## 2019-12-16 ENCOUNTER — Emergency Department (HOSPITAL_COMMUNITY): Payer: Medicaid Other

## 2019-12-16 ENCOUNTER — Other Ambulatory Visit: Payer: Self-pay

## 2019-12-16 ENCOUNTER — Emergency Department (HOSPITAL_COMMUNITY)
Admission: EM | Admit: 2019-12-16 | Discharge: 2019-12-16 | Disposition: A | Discharge status: Home / Self Care | Payer: Medicaid Other | Attending: Emergency Medicine | Admitting: Emergency Medicine

## 2019-12-16 DIAGNOSIS — N12 Tubulo-interstitial nephritis, not specified as acute or chronic: Secondary | ICD-10-CM

## 2019-12-16 DIAGNOSIS — Z20822 Contact with and (suspected) exposure to covid-19: Secondary | ICD-10-CM | POA: Diagnosis not present

## 2019-12-16 DIAGNOSIS — R55 Syncope and collapse: Secondary | ICD-10-CM | POA: Insufficient documentation

## 2019-12-16 DIAGNOSIS — N10 Acute tubulo-interstitial nephritis: Secondary | ICD-10-CM | POA: Diagnosis not present

## 2019-12-16 DIAGNOSIS — R0602 Shortness of breath: Secondary | ICD-10-CM | POA: Diagnosis not present

## 2019-12-16 DIAGNOSIS — J45909 Unspecified asthma, uncomplicated: Secondary | ICD-10-CM | POA: Insufficient documentation

## 2019-12-16 DIAGNOSIS — R112 Nausea with vomiting, unspecified: Secondary | ICD-10-CM | POA: Diagnosis present

## 2019-12-16 DIAGNOSIS — R519 Headache, unspecified: Secondary | ICD-10-CM | POA: Insufficient documentation

## 2019-12-16 LAB — URINALYSIS, ROUTINE W REFLEX MICROSCOPIC
Bilirubin Urine: NEGATIVE
Glucose, UA: NEGATIVE mg/dL
Hgb urine dipstick: NEGATIVE
Ketones, ur: 20 mg/dL — AB
Nitrite: POSITIVE — AB
Protein, ur: NEGATIVE mg/dL
Specific Gravity, Urine: 1.02 (ref 1.005–1.030)
pH: 8 (ref 5.0–8.0)

## 2019-12-16 LAB — COMPREHENSIVE METABOLIC PANEL
ALT: 12 U/L (ref 0–44)
AST: 14 U/L — ABNORMAL LOW (ref 15–41)
Albumin: 3.3 g/dL — ABNORMAL LOW (ref 3.5–5.0)
Alkaline Phosphatase: 60 U/L (ref 38–126)
Anion gap: 9 (ref 5–15)
BUN: 7 mg/dL (ref 6–20)
CO2: 23 mmol/L (ref 22–32)
Calcium: 9.1 mg/dL (ref 8.9–10.3)
Chloride: 103 mmol/L (ref 98–111)
Creatinine, Ser: 0.81 mg/dL (ref 0.44–1.00)
GFR calc Af Amer: >60 mL/min (ref >60)
GFR calc non Af Amer: >60 mL/min (ref >60)
Glucose, Bld: 104 mg/dL — ABNORMAL HIGH (ref 70–99)
Potassium: 3.7 mmol/L (ref 3.5–5.1)
Sodium: 135 mmol/L (ref 135–145)
Total Bilirubin: 0.5 mg/dL (ref 0.3–1.2)
Total Protein: 7.7 g/dL (ref 6.5–8.1)

## 2019-12-16 LAB — DIFFERENTIAL
Abs Immature Granulocytes: 0.05 10*3/uL (ref 0.00–0.07)
Basophils Absolute: 0 10*3/uL (ref 0.0–0.1)
Basophils Relative: 0 %
Eosinophils Absolute: 0 10*3/uL (ref 0.0–0.5)
Eosinophils Relative: 0 %
Immature Granulocytes: 0 %
Lymphocytes Relative: 11 %
Lymphs Abs: 1.4 10*3/uL (ref 0.7–4.0)
Monocytes Absolute: 1.3 10*3/uL — ABNORMAL HIGH (ref 0.1–1.0)
Monocytes Relative: 10 %
Neutro Abs: 10.2 10*3/uL — ABNORMAL HIGH (ref 1.7–7.7)
Neutrophils Relative %: 79 %

## 2019-12-16 LAB — APTT: aPTT: 29 seconds (ref 24–36)

## 2019-12-16 LAB — CBC
HCT: 39.1 % (ref 36.0–46.0)
Hemoglobin: 12.4 g/dL (ref 12.0–15.0)
MCH: 26.1 pg (ref 26.0–34.0)
MCHC: 31.7 g/dL (ref 30.0–36.0)
MCV: 82.1 fL (ref 80.0–100.0)
Platelets: 328 10*3/uL (ref 150–400)
RBC: 4.76 MIL/uL (ref 3.87–5.11)
RDW: 15.1 % (ref 11.5–15.5)
WBC: 13.1 10*3/uL — ABNORMAL HIGH (ref 4.0–10.5)
nRBC: 0 % (ref 0.0–0.2)

## 2019-12-16 LAB — SARS CORONAVIRUS 2 BY RT PCR (HOSPITAL ORDER, PERFORMED IN ~~LOC~~ HOSPITAL LAB): SARS Coronavirus 2: NEGATIVE

## 2019-12-16 LAB — RAPID URINE DRUG SCREEN, HOSP PERFORMED
Amphetamines: NOT DETECTED
Barbiturates: NOT DETECTED
Benzodiazepines: NOT DETECTED
Cocaine: NOT DETECTED
Opiates: NOT DETECTED
Tetrahydrocannabinol: POSITIVE — AB

## 2019-12-16 LAB — CBG MONITORING, ED: Glucose-Capillary: 107 mg/dL — ABNORMAL HIGH (ref 70–99)

## 2019-12-16 LAB — D-DIMER, QUANTITATIVE: D-Dimer, Quant: 0.4 ug/mL-FEU (ref 0.00–0.50)

## 2019-12-16 LAB — PROTIME-INR
INR: 1.1 (ref 0.8–1.2)
Prothrombin Time: 13.4 seconds (ref 11.4–15.2)

## 2019-12-16 LAB — I-STAT BETA HCG BLOOD, ED (MC, WL, AP ONLY): I-stat hCG, quantitative: <5 m[IU]/mL (ref <5)

## 2019-12-16 MED ORDER — ACETAMINOPHEN 325 MG PO TABS
650.0000 mg | ORAL_TABLET | Freq: Once | ORAL | Status: AC
  Administered 2019-12-16: 650 mg via ORAL
  Filled 2019-12-16: qty 2

## 2019-12-16 MED ORDER — SODIUM CHLORIDE 0.9 % IV BOLUS
1000.0000 mL | Freq: Once | INTRAVENOUS | Status: AC
  Administered 2019-12-16: 1000 mL via INTRAVENOUS

## 2019-12-16 MED ORDER — SODIUM CHLORIDE 0.9% FLUSH
3.0000 mL | Freq: Once | INTRAVENOUS | Status: AC
Start: 2019-12-16 — End: 2019-12-16
  Administered 2019-12-16: 3 mL via INTRAVENOUS

## 2019-12-16 MED ORDER — ONDANSETRON HCL 4 MG/2ML IJ SOLN
4.0000 mg | Freq: Once | INTRAMUSCULAR | Status: AC
  Administered 2019-12-16: 4 mg via INTRAVENOUS
  Filled 2019-12-16: qty 2

## 2019-12-16 MED ORDER — CEPHALEXIN 500 MG PO CAPS
500.0000 mg | ORAL_CAPSULE | Freq: Three times a day (TID) | ORAL | 0 refills | Status: AC
Start: 2019-12-16 — End: 2019-12-26

## 2019-12-16 NOTE — ED Notes (Signed)
Pt ambulatory to restroom with steady gait. Pt alert and oriented X2. Endorses feeing much better. Took pt bag lunch and drink

## 2019-12-16 NOTE — ED Triage Notes (Signed)
Pt to triage via [facility] from home.  Pt reports frontal headache and body aches since last night.  Reports dizziness since 8am.  Pt had syncopal episode witnessed by brother this morning.  Didn't hit head.  On [facility] dept arrival pt was cool, pale, diaphoretic and unresponsive.  Pt began responding when they attempted to insert NPA.  EMS reports slurred speech PTA.  Speech clear at this time.  33 month old just diagnosed with viral infection.  No arm drift.

## 2019-12-16 NOTE — ED Provider Notes (Signed)
[facility] EMERGENCY DEPARTMENT Provider Note   CSN: [id number] Arrival date & time: 12/16/19  1039     History Chief Complaint  Patient presents with  . Loss of Consciousness  . Headache  . Dizziness    Karen Curry is a 24 y.o. female.  HPI  24 year old female with a history of UTIs, asthma, kidney stones presents to the ER for headache, nausea, nonbloody, nonbilious vomiting and syncope which occurred earlier this morning.  Patient reports that she started having frontal headache last night with body aches.  She said she had several episodes of nausea and vomiting, and went to bed.  She stated that this morning at around 7 AM she woke up again with the headache, dizziness and continuous nausea.  She stated that she went downstairs to make breakfast, and continuously felt weak and dizzy.  After going up stairs, she stated that she remembered talking to her brother and the next thing she knew she was on the ground.  Per triage notes, EMS had found the patient cool, pale, diaphoretic and unresponsive.  Patient began responsive after they attempted to insert an NPA.  Apparently they noted some slurred speech.  She reports that her 88-month-old was just diagnosed with a viral infection but she does not know which one.  She denies any chest pain, states that she had shortness of breath yesterday and today but not now.  She also endorses some body aches.  Denies any alcohol or drug use.  Here in the ER, she states that her headache has improved, however now her back hurts.  Patient is moaning and hesitant to respond here in the ED.  She endorses mild dysuria, but no noticeable blood in her urine.  No vaginal bleeding or discharge.  She says she has some generalized abdominal pain but no chest pain.  She says that she does not have a thermometer at home, but she definitely felt "hot" over the last 2 days.  Past Medical History:  Diagnosis Date  . Asthma   . Kidney stones    24  yo  . UTI (urinary tract infection)     Patient Active Problem List   Diagnosis Date Noted  . Spontaneous vaginal delivery 04/27/2019  . Normal labor 04/25/2019  . Acute right flank pain 02/20/2019  . UTI (urinary tract infection) 11/06/2018  . Supervision of low-risk pregnancy 10/17/2018  . History of low birth weight 10/17/2018  . Acanthosis 06/16/2015  . Hirsutism 06/16/2015  . Obesity (BMI 30-39.9) 06/16/2015    Past Surgical History:  Procedure Laterality Date  . NO PAST SURGERIES       OB History    Gravida  2   Para  2   Term  2   Preterm  0   AB  0   Living  2     SAB  0   TAB  0   Ectopic  0   Multiple  0   Live Births  2           Family History  Problem Relation Age of Onset  . Hypertension Father   . Hypertension Mother     Social History   Tobacco Use  . Smoking status: Never Smoker  . Smokeless tobacco: Never Used  Vaping Use  . Vaping Use: Never used  Substance Use Topics  . Alcohol use: No    Alcohol/week: 0.0 standard drinks  . Drug use: No    Home Medications Prior  to Admission medications   Medication Sig Start Date End Date Taking? Authorizing Provider  acetaminophen (TYLENOL) 325 MG tablet Take 2 tablets (650 mg total) by mouth every 4 (four) hours as needed (for pain scale < 4). 04/27/19   Taam-Akelman, Griselda Miner, MD  cephALEXin (KEFLEX) 500 MG capsule Take 1 capsule (500 mg total) by mouth 3 (three) times daily for 10 days. 12/16/19 12/26/19  Mare Ferrari, PA-C  docusate sodium (COLACE) 50 MG capsule Take 1 capsule (50 mg total) by mouth 2 (two) times daily. 04/27/19   Taam-Akelman, Griselda Miner, MD  ferrous sulfate (FERROUSUL) 325 (65 FE) MG tablet Take 1 tablet (325 mg total) by mouth daily with breakfast. 04/27/19   Taam-Akelman, Griselda Miner, MD  ibuprofen (ADVIL) 600 MG tablet Take 1 tablet (600 mg total) by mouth every 6 (six) hours. 04/27/19   Taam-Akelman, Griselda Miner, MD  oxyCODONE-acetaminophen (PERCOCET) 5-325 MG  tablet Take 1 tablet by mouth every 8 (eight) hours as needed for severe pain. 04/27/19   Taam-Akelman, Griselda Miner, MD  Prenatal Vit-Fe Fumarate-FA (PRENATAL 19) 29-1 MG CHEW Chew 1 tablet by mouth daily. 10/17/18   Marylene Land, CNM    Allergies    Patient has no known allergies.  Review of Systems   Review of Systems  Constitutional: Negative for chills and fever.  HENT: Negative for ear pain and sore throat.   Eyes: Negative for pain and visual disturbance.  Respiratory: Positive for shortness of breath. Negative for cough.   Cardiovascular: Negative for chest pain and palpitations.  Gastrointestinal: Positive for nausea and vomiting. Negative for abdominal pain.  Genitourinary: Negative for dysuria and hematuria.  Musculoskeletal: Negative for arthralgias and back pain.  Skin: Negative for color change and rash.  Neurological: Negative for seizures and syncope.  All other systems reviewed and are negative.   Physical Exam Updated Vital Signs BP (!) 91/59 (BP Location: Right Arm)   Pulse 76   Temp 99.7 F (37.6 C) (Oral)   Resp 17   Ht 5\' 3"  (1.6 m)   Wt 90.7 kg   SpO2 92%   BMI 35.43 kg/m   Physical Exam Vitals and nursing note reviewed.  Constitutional:      General: She is not in acute distress.    Appearance: Normal appearance. She is well-developed. She is obese. She is not ill-appearing.  HENT:     Head: Normocephalic and atraumatic.  Eyes:     Conjunctiva/sclera: Conjunctivae normal.     Pupils: Pupils are equal, round, and reactive to light.  Cardiovascular:     Rate and Rhythm: Normal rate and regular rhythm.     Pulses: Normal pulses.     Heart sounds: Normal heart sounds. No murmur heard.   Pulmonary:     Effort: Pulmonary effort is normal. No respiratory distress.     Breath sounds: Normal breath sounds.  Abdominal:     Palpations: Abdomen is soft.     Tenderness: There is no abdominal tenderness. There is right CVA tenderness. There  is no left CVA tenderness.     Hernia: No hernia is present.     Comments: Abdomen soft and nontender, without peritoneal signs  Musculoskeletal:        General: No tenderness or deformity.     Cervical back: Normal range of motion and neck supple.  Skin:    General: Skin is warm and dry.     Capillary Refill: Capillary refill takes less than 2 seconds.  Coloration: Skin is not pale.     Findings: No bruising.  Neurological:     General: No focal deficit present.     Mental Status: She is alert and oriented to person, place, and time.     Comments: Mental Status:  Alert, thought content appropriate, able to give a coherent history. Speech fluent without evidence of aphasia. Able to follow 2 step commands without difficulty.  Cranial Nerves:  II: Peripheral visual fields grossly normal, pupils equal, round, reactive to light III,IV, VI: ptosis not present, extra-ocular motions intact bilaterally  V,VII: smile symmetric, facial light touch sensation equal VIII: hearing grossly normal to voice  X: uvula elevates symmetrically  XI: bilateral shoulder shrug symmetric and strong XII: midline tongue extension without fassiculations Motor:  Normal tone. 5/5 strength of BUE and BLE major muscle groups including strong and equal grip strength and dorsiflexion/plantar flexion Sensory: light touch normal in all extremities. Cerebellar: normal finger-to-nose with bilateral upper extremities, Romberg sign absent Gait: normal gait and balance. Able to walk on toes and heels with ease.   Psychiatric:        Mood and Affect: Mood normal.        Behavior: Behavior normal.     ED Results / Procedures / Treatments   Labs (all labs ordered are listed, but only abnormal results are displayed) Labs Reviewed  CBC - Abnormal; Notable for the following components:      Result Value   WBC 13.1 (*)    All other components within normal limits  DIFFERENTIAL - Abnormal; Notable for the following  components:   Neutro Abs 10.2 (*)    Monocytes Absolute 1.3 (*)    All other components within normal limits  COMPREHENSIVE METABOLIC PANEL - Abnormal; Notable for the following components:   Glucose, Bld 104 (*)    Albumin 3.3 (*)    AST 14 (*)    All other components within normal limits  URINALYSIS, ROUTINE W REFLEX MICROSCOPIC - Abnormal; Notable for the following components:   APPearance HAZY (*)    Ketones, ur 20 (*)    Nitrite POSITIVE (*)    Leukocytes,Ua TRACE (*)    Bacteria, UA MANY (*)    All other components within normal limits  RAPID URINE DRUG SCREEN, HOSP PERFORMED - Abnormal; Notable for the following components:   Tetrahydrocannabinol POSITIVE (*)    All other components within normal limits  CBG MONITORING, ED - Abnormal; Notable for the following components:   Glucose-Capillary 107 (*)    All other components within normal limits  SARS CORONAVIRUS 2 BY RT PCR (HOSPITAL ORDER, PERFORMED IN [facility])  URINE CULTURE  PROTIME-INR  APTT  D-DIMER, QUANTITATIVE (NOT AT [facility])  I-STAT BETA HCG BLOOD, ED ([facility], [facility], [facility] ONLY)    EKG None  Radiology CT HEAD WO CONTRAST  Result Date: 12/16/2019 CLINICAL DATA:  Headache, dizziness. EXAM: CT HEAD WITHOUT CONTRAST TECHNIQUE: Contiguous axial images were obtained from the base of the skull through the vertex without intravenous contrast. COMPARISON:  None. FINDINGS: Brain: No evidence of acute infarction, hemorrhage, hydrocephalus, extra-axial collection or mass lesion/mass effect. Vascular: No hyperdense vessel or unexpected calcification. Skull: Normal. Negative for fracture or focal lesion. Sinuses/Orbits: No acute finding. Other: None. IMPRESSION: Normal head CT. Electronically Signed   By: Lupita Raider M.D.   On: 12/16/2019 12:21   DG Chest Portable 1 View  Result Date: 12/16/2019 CLINICAL DATA:  Shortness of breath EXAM: PORTABLE CHEST 1  VIEW COMPARISON:  2019 FINDINGS: The heart size and mediastinal  contours are within normal limits. No consolidation or edema. No pleural effusion or pneumothorax. The visualized skeletal structures are unremarkable. IMPRESSION: No acute process in the chest. Electronically Signed   By: Guadlupe SpanishPraneil  Patel M.D.   On: 12/16/2019 14:07    Procedures Procedures (including critical care time)  Medications Ordered in ED Medications  sodium chloride flush (NS) 0.9 % injection 3 mL (3 mLs Intravenous Given 12/16/19 1254)  acetaminophen (TYLENOL) tablet 650 mg (650 mg Oral Given 12/16/19 1252)  sodium chloride 0.9 % bolus 1,000 mL (1,000 mLs Intravenous New Bag/Given 12/16/19 1253)  ondansetron (ZOFRAN) injection 4 mg (4 mg Intravenous Given 12/16/19 1251)    ED Course  I have reviewed the triage vital signs and the nursing notes.  Pertinent labs & imaging results that were available during my care of the patient were reviewed by me and considered in my medical decision making (see chart for details).    MDM Rules/Calculators/A&P                         24 year old female with nausea, vomiting, frontal headache at one point was "unresponsive" per  On presentation, the patient is alert and oriented, nontoxic-appearing, no acute distress.  She is moaning and complaining of nausea, but is able to give a clear recollection of the events surrounding her ER visit.  Her vitals are overall reassuring, though she has a questionable borderline temp.  Her abdomen is soft and nontender, lungs clear to auscultation.  She does have some right-sided flank pain.  Overall very well-appearing.  Personally reviewed her lab work and imaging, she has a leukocytosis of 13.1, normal hemoglobin.  CMP without any significant electrolyte abnormalities, normal BUN/creatinine.  AST slightly decreased at 14.  CBG 107.  Negative hCG.  UA with evidence of UTI with positive nitrites leukocytes and many bacteria.  Of note, patient does not endorse any use of alcohol or drugs, however her urine here today  is positive for THC.  PT/INR normal.  Given that the patient had shortness of breath, D-dimer negative.  CT of the head ordered in triage was negative for any intracranial pathology.  Chest x-ray without evidence of infection.  EKG normal sinus rhythm.  Overall, the patient is well-appearing, received fluids, Zofran and Tylenol and reports significant relief in her symptoms.  She states that she has a history of UTIs and is not surprised when I reported that she has 1.  I do not think that she needs any additional imaging of her brain, as her neuro exam is completely normal.  Other than borderline fever, she is not tachycardic, tachypnea, do not think she is septic.  Her abdomen is soft and nontender and thus I do not think she needs any additional CT imaging of her abdomen to rule out a surgical abdomen.  Does not endorse any chest pain, do not think she needs any additional work-up as her EKG and chest x-ray appear normal.  Borderline fever likely secondary to UTI.  We will treat her for Pyelo given flank tenderness for 10 days.  D-dimer negative, doubt PE as the cause of her syncope.  Covid negative.  Patient was given 10-day course of Keflex.  Does not endorse any nausea, tolerating p.o. well.  Discussed the case with Dr. Judd Lienelo who is agreeable to the above plan and disposition.  Return precautions discussed.  All the patient's questions have  been answered to her satisfaction, she voices understanding and is agreeable to this plan.  At this stage in the ED course, the patient is has been adequate screened and is stable for discharge.  Final Clinical Impression(s) / ED Diagnoses Final diagnoses:  Pyelonephritis    Rx / DC Orders ED Discharge Orders         Ordered    cephALEXin (KEFLEX) 500 MG capsule  3 times daily     Discontinue  Reprint     12/16/19 1524           Mare Ferrari, PA-C 12/16/19 1547    Geoffery Lyons, MD 12/16/19 Ernestina [place]

## 2019-12-16 NOTE — Discharge Instructions (Signed)
Your work-up today showed evidence of UTI.  Please take the antibiotic as prescribed until finished in order to prevent antibiotic resistance.  Please make sure to establish with a primary care doctor with the phone number on your discharge paperwork to follow-up.  Return to the ER if your symptoms worsen.

## 2019-12-20 LAB — URINE CULTURE: Culture: >=100000 — AB

## 2019-12-22 ENCOUNTER — Telehealth: Payer: Self-pay | Admitting: Emergency Medicine

## 2019-12-22 NOTE — Telephone Encounter (Signed)
Post ED Visit - Positive Culture Follow-up  Culture report reviewed by antimicrobial stewardship pharmacist: [facility] []  , Pharm.D. []  Enzo Bi, Pharm.D., BCPS AQ-ID []  , Pharm.D., BCPS []  Celedonio Miyamoto, Pharm.D., BCPS []  [place], Garvin Fila.D., BCPS, AAHIVP []  , Pharm.D., BCPS, AAHIVP []  Georgina Pillion, PharmD, BCPS []  , PharmD, BCPS []  [place], PharmD, BCPS [x]  [address], PharmD []  , PharmD, BCPS []  Estella Husk, PharmD  Pharmacy Team []  Lysle Pearl, PharmD []  , PharmD []  Phillips Climes, PharmD []  , Rph []  Agapito Games) , PharmD []  Leia Alf, PharmD []  , PharmD []  Mervyn Gay, PharmD []  , PharmD []  Vinnie Level, PharmD []  [facility], PharmD []  , PharmD []  Len Childs, PharmD   Positive urine culture Treated with Cephalexin, organism sensitive to the same and no further patient follow-up is required at this time.  Zuleyka Kloc 12/22/2019, 10:38 AM

## 2020-06-08 IMAGING — US OBSTETRIC <14 WK ULTRASOUND
1 series · 14 of 28 positions shown · non-contrast
Comparison: None.

CLINICAL DATA: Back and pelvic pain. Flank pain. Pregnant patient.

EXAM:
OBSTETRIC <14 WK ULTRASOUND
TECHNIQUE: Transabdominal ultrasound was performed for evaluation of the
gestation as well as the maternal uterus and adnexal regions.

[Series 1: obstetric <14 wk ultrasound · 42 acquisitions, 14 frames shown]
[im 2/42]
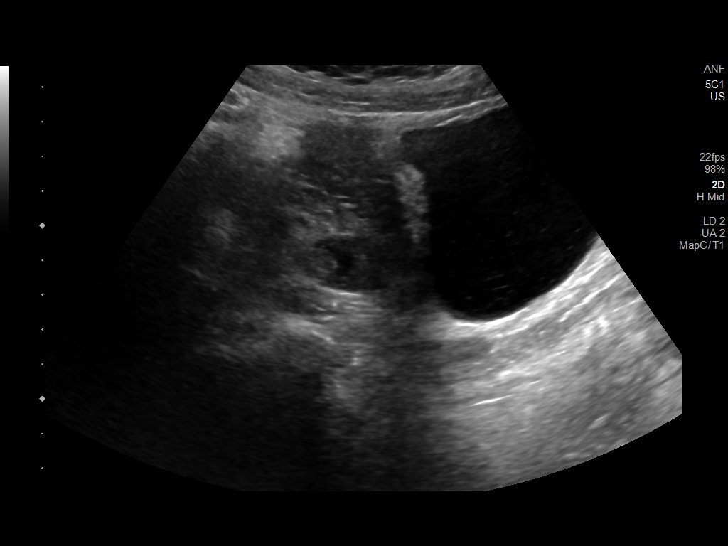
[im 5/42]
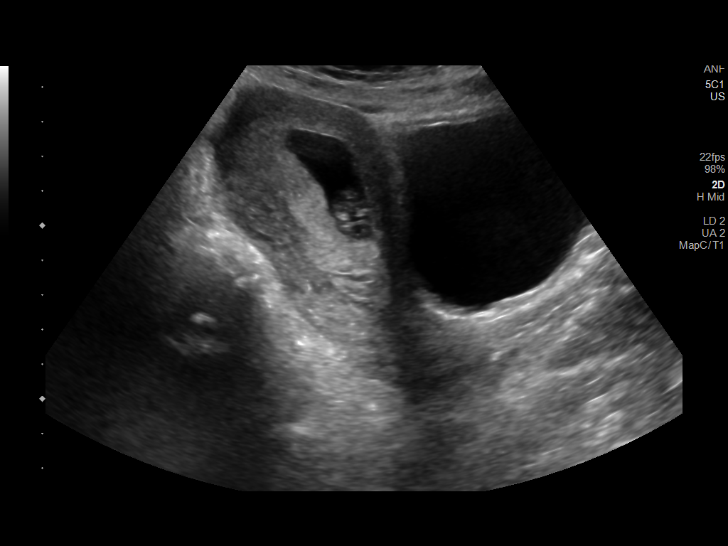
[im 8/42]
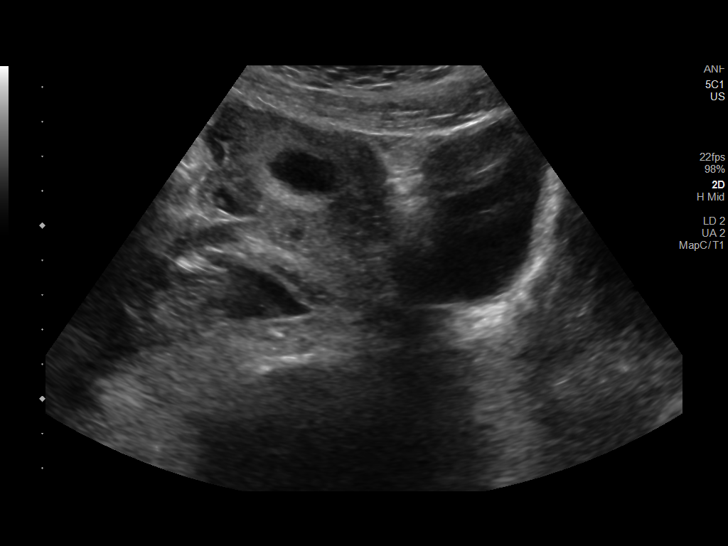
[im 11/42]
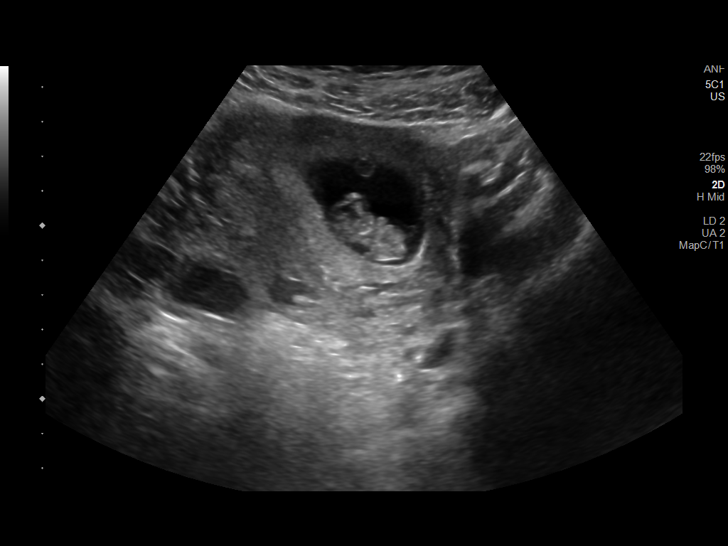
[im 14/42]
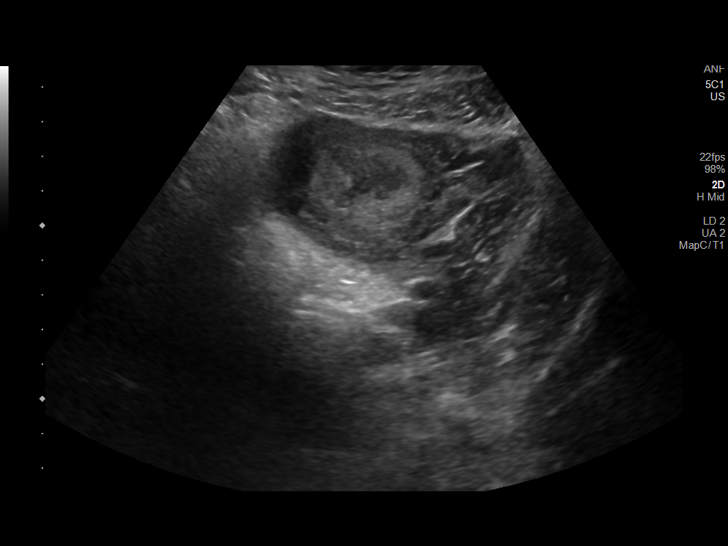
[im 17/42]
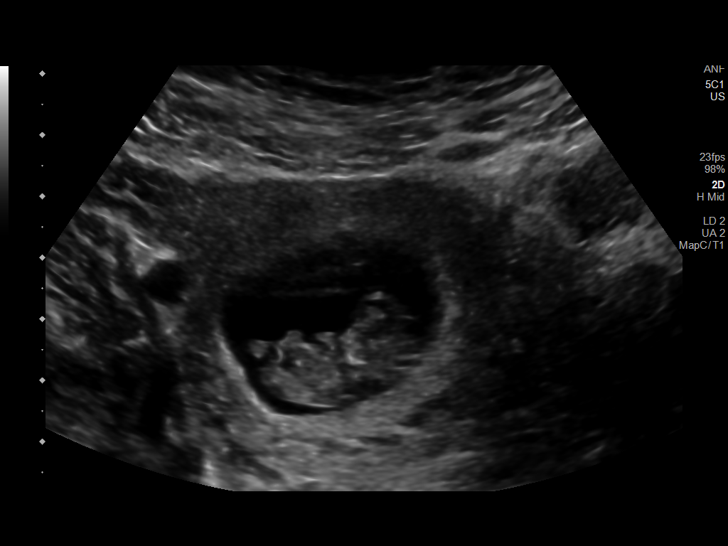
[im 20/42]
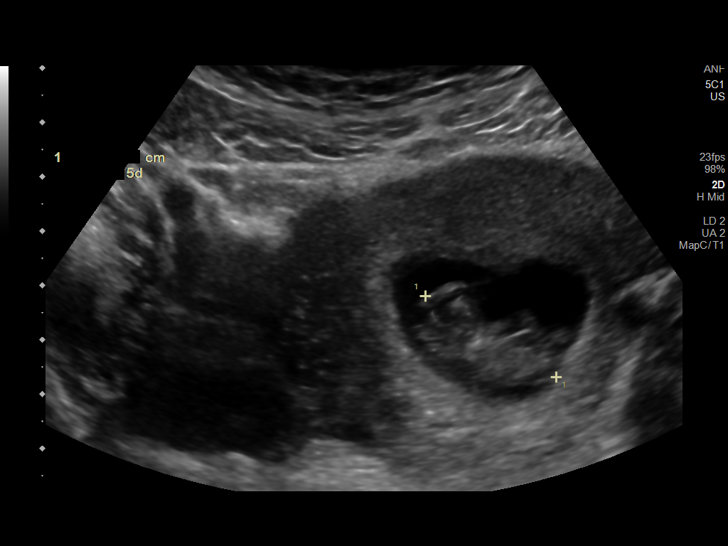
[im 23/42]
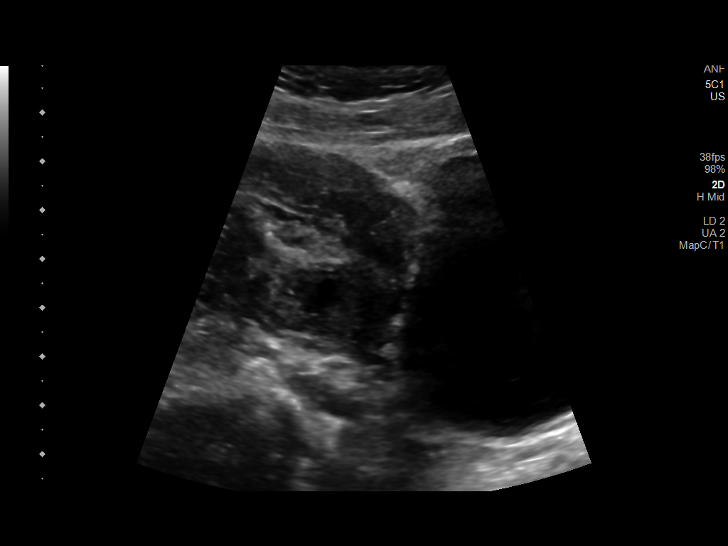
[im 26/42]
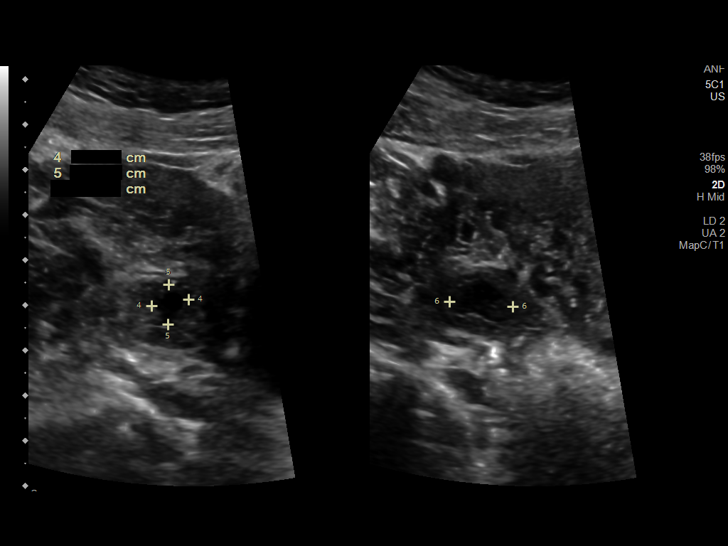
[im 29/42]
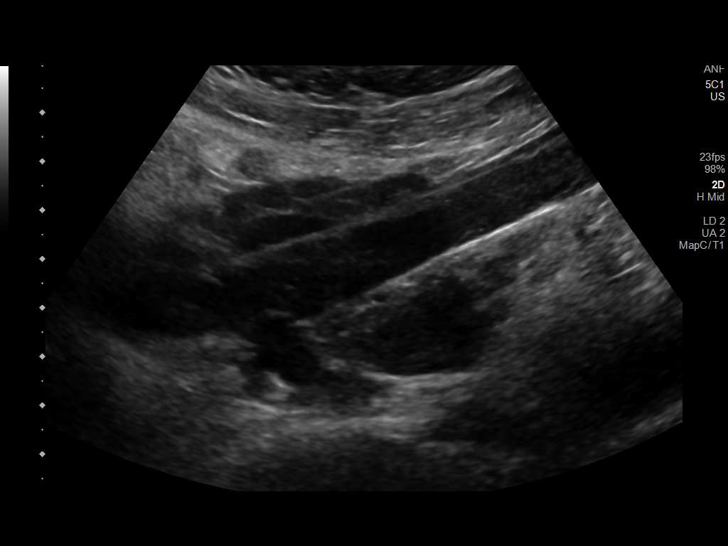
[im 32/42]
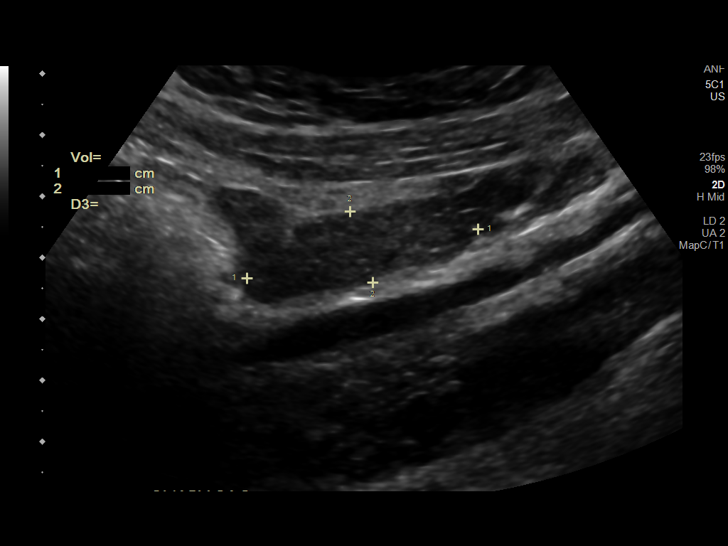
[im 35/42]
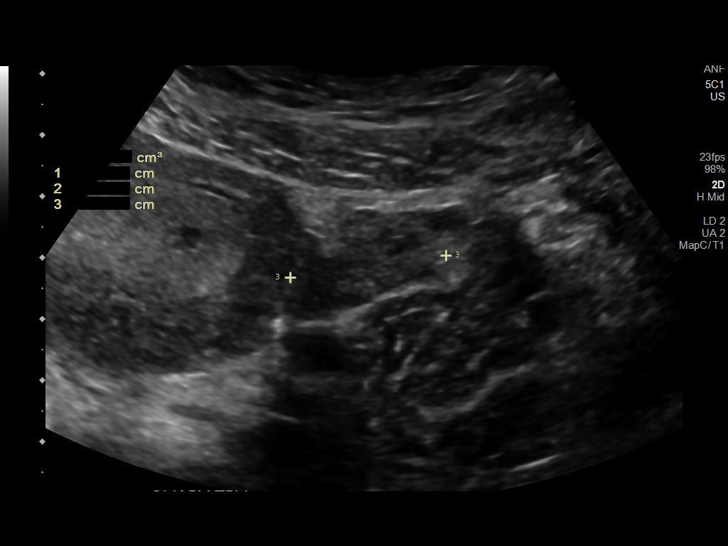
[im 38/42]
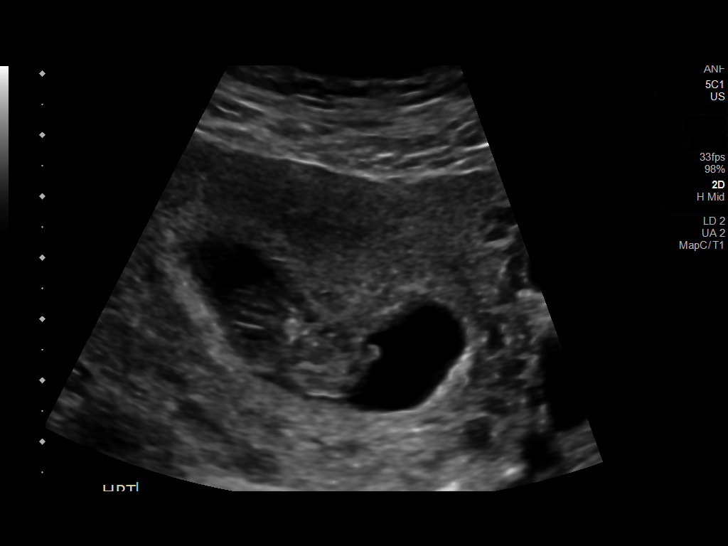
[im 42/42]
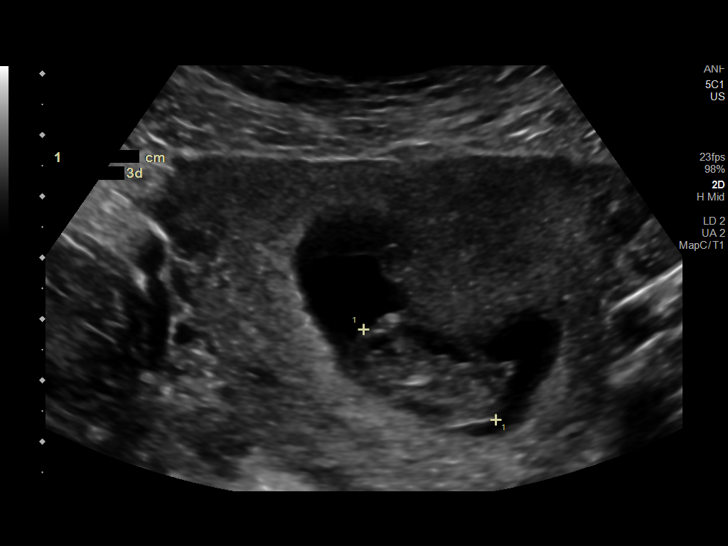

[14 of 28 positions shown; findings below may reference images not displayed]

FINDINGS: Intrauterine gestational sac: Single

Yolk sac:  Visualized.

Embryo:  Visualized.

Cardiac Activity: Visualized.

Heart Rate: 178 bpm

MSD:    mm    w     d

CRL:   26 mm   9 w 3 d                  US EDC: May 03, 2019

Subchorionic hemorrhage:  None visualized.

Maternal uterus/adnexae: The ovaries are normal with a corpus luteum
cyst on the right.
IMPRESSION: Single live IUP. No cause for pain identified. Corpus luteum cyst in
the right ovary.

## 2020-09-22 IMAGING — US US RENAL
1 series · 15 of 25 positions shown · non-contrast
Comparison: 10/01/2018

CLINICAL DATA: CVA tenderness.  Pregnancy.

EXAM:
RENAL / URINARY TRACT ULTRASOUND COMPLETE

[Series 1: us renal · 15 of 51 slices shown]
[im 1/51]
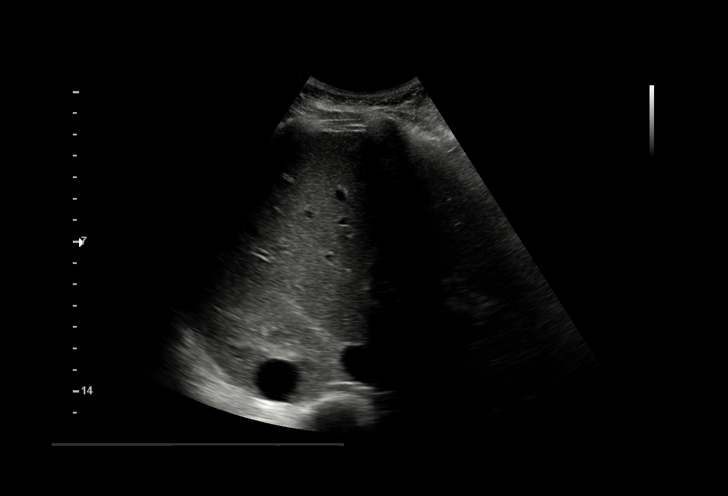
[im 5/51]
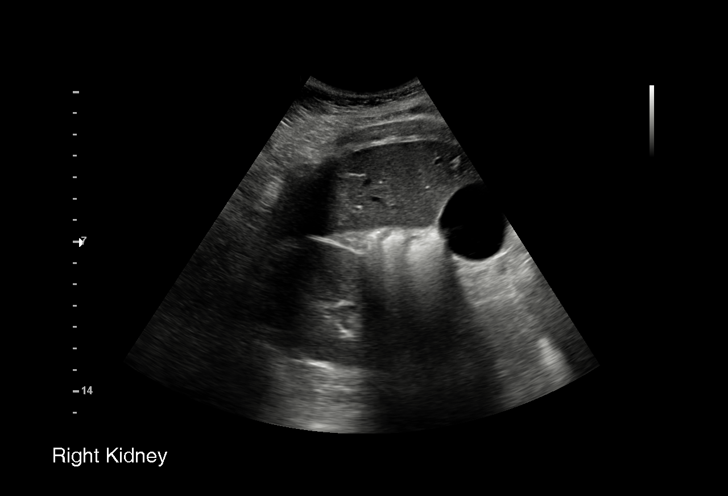
[im 9/51]
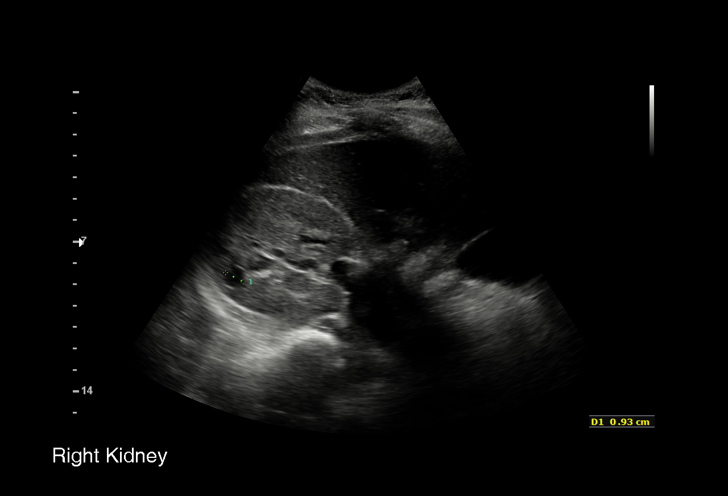
[im 11/51]
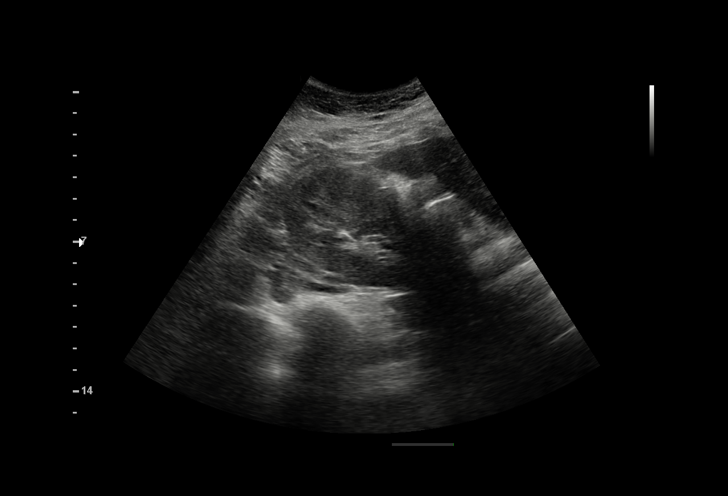
[im 15/51]
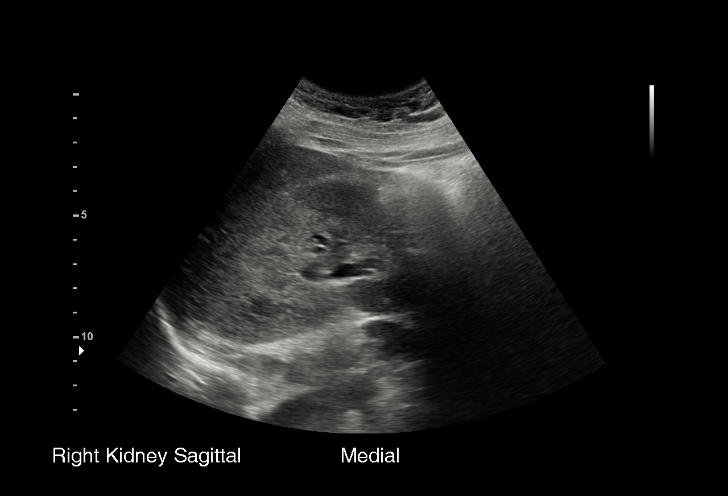
[im 19/51]
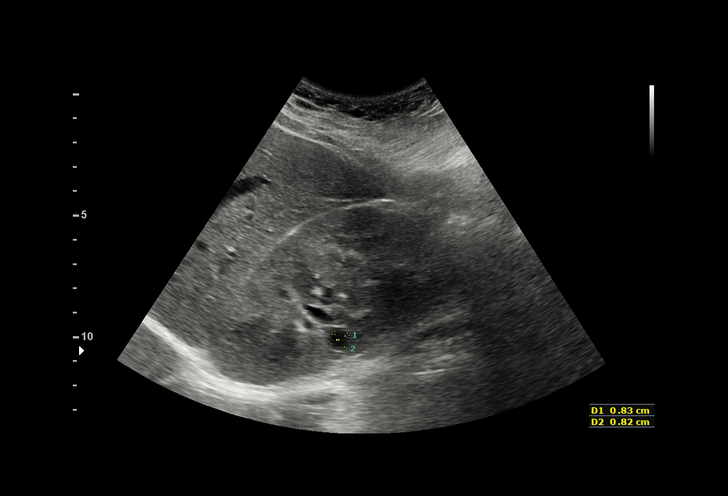
[im 21/51]
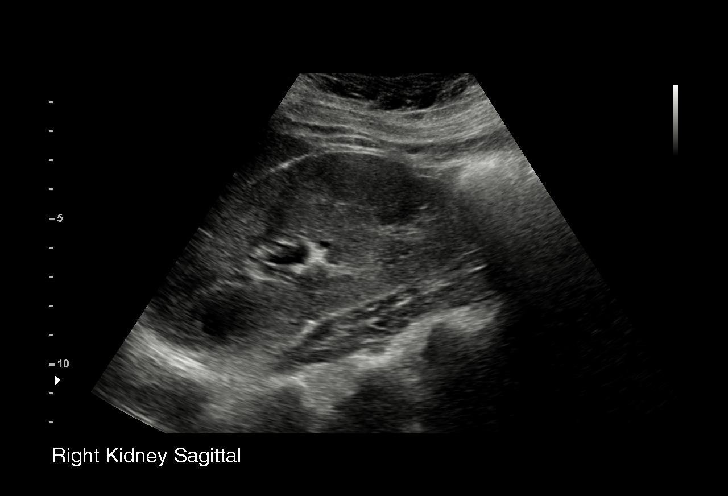
[im 26/51]
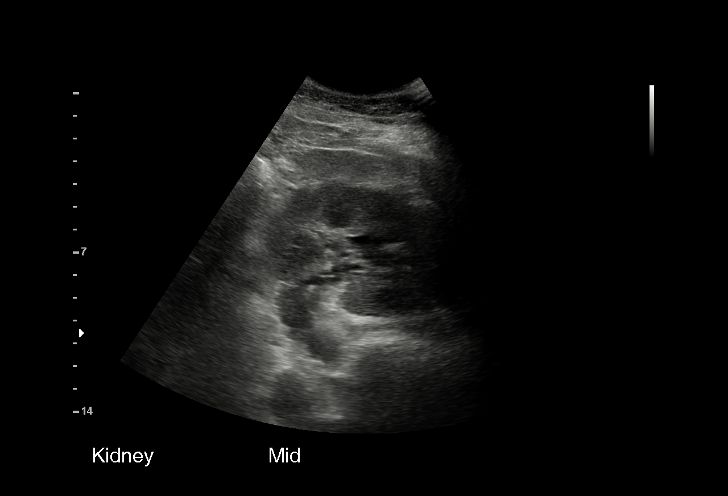
[im 30/51]
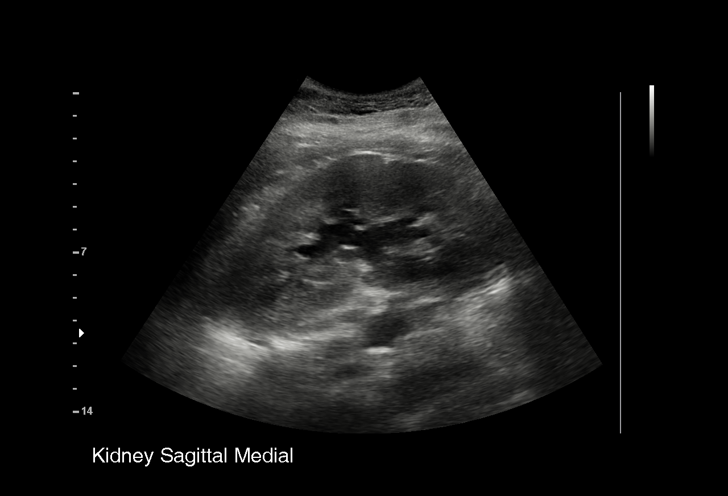
[im 32/51]
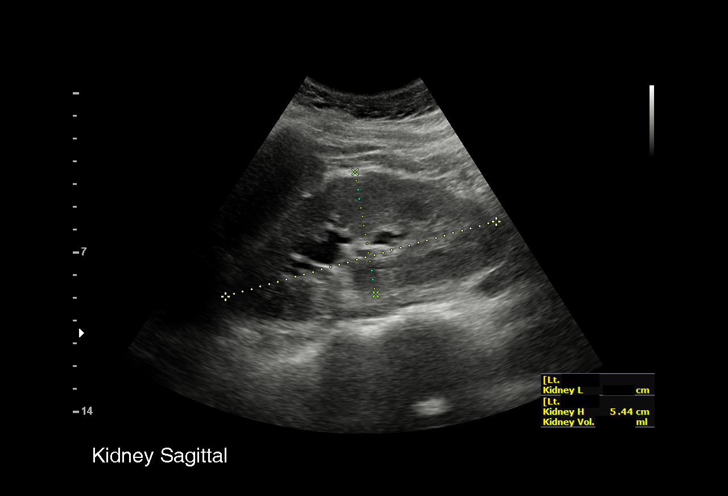
[im 36/51]
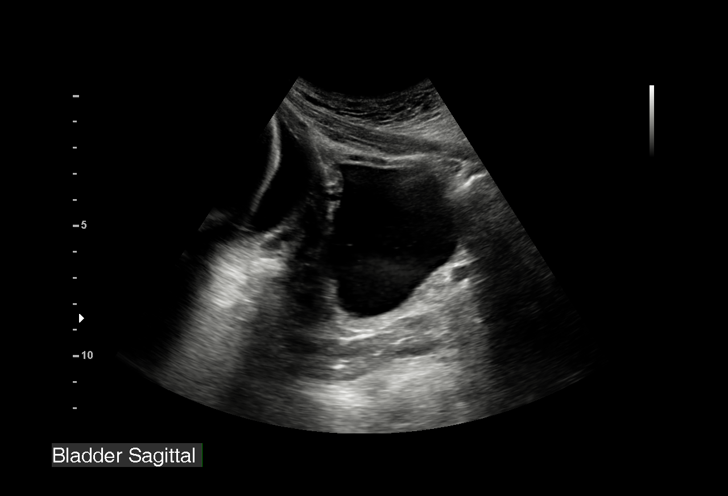
[im 40/51]
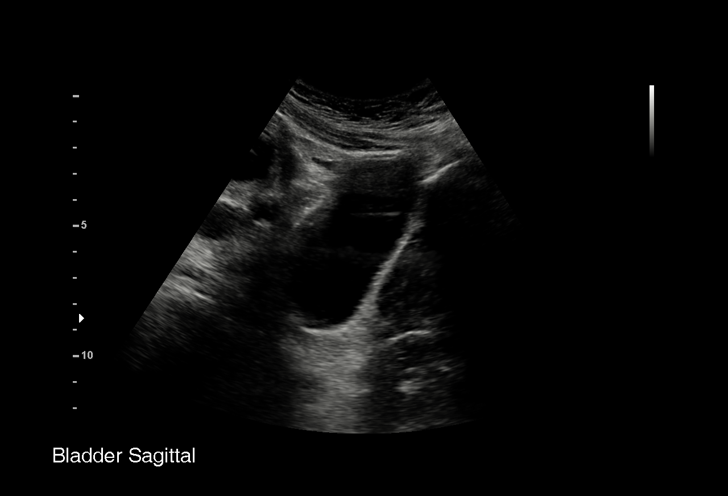
[im 42/51]
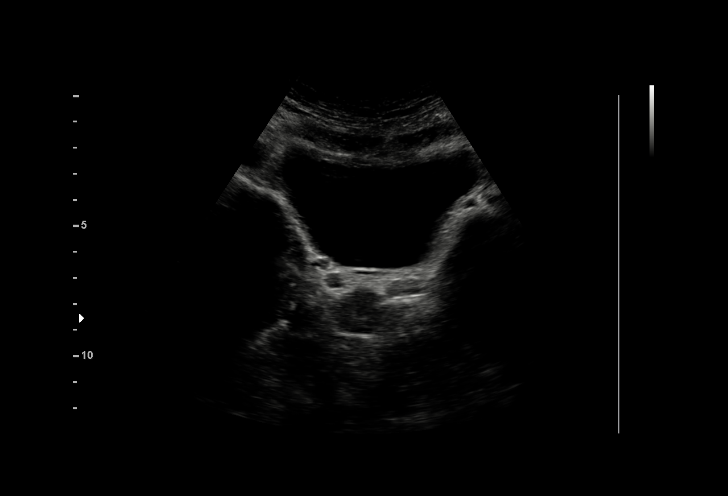
[im 46/51]
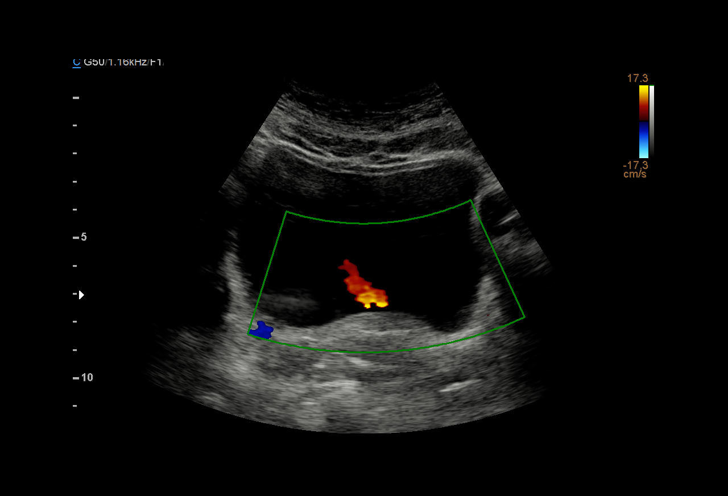
[im 51/51]
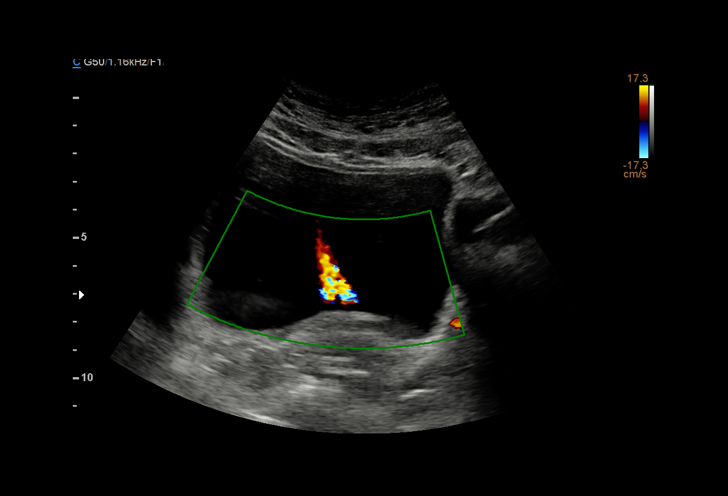

[15 of 25 positions shown; findings below may reference images not displayed]

FINDINGS: Right Kidney:

Renal measurements: 10.9 x 5.5 x 5.4 cm = volume: 171 mL .
Echogenicity within normal limits. Redemonstration 2 simple renal
cysts, the largest measuring up to 2.7 cm at the superior pole and
an additional 0.8 cm cyst in the interpolar region. No shadowing
stone. No solid mass or hydronephrosis visualized.

Left Kidney:

Renal measurements: 12.3 x 5.7 x 5.4 cm = volume: 201 mL.
Echogenicity within normal limits. Mild hydronephrosis. No shadowing
stone. No mass visualized.

Bladder:

Appears normal for degree of bladder distention.
IMPRESSION: 1. Mild left hydronephrosis, similar to prior.
2. Unchanged simple right renal cysts.

## 2021-11-12 ENCOUNTER — Ambulatory Visit
Admission: RE | Admit: 2021-11-12 | Discharge: 2021-11-12 | Disposition: A | Discharge status: Home / Self Care | Payer: No Typology Code available for payment source | Source: Ambulatory Visit | Attending: Obstetrics and Gynecology | Admitting: Obstetrics and Gynecology

## 2021-11-12 ENCOUNTER — Other Ambulatory Visit: Payer: Self-pay | Admitting: Obstetrics and Gynecology

## 2021-11-12 DIAGNOSIS — R7611 Nonspecific reaction to tuberculin skin test without active tuberculosis: Secondary | ICD-10-CM

## 2021-11-19 ENCOUNTER — Encounter (HOSPITAL_COMMUNITY): Payer: Self-pay | Admitting: Family Medicine

## 2021-11-19 ENCOUNTER — Inpatient Hospital Stay (HOSPITAL_COMMUNITY): Payer: Medicaid Other

## 2021-11-19 ENCOUNTER — Inpatient Hospital Stay (HOSPITAL_COMMUNITY)
Admission: AD | Admit: 2021-11-19 | Discharge: 2021-11-19 | Disposition: A | Discharge status: Home / Self Care | Payer: Medicaid Other | Attending: Family Medicine | Admitting: Family Medicine

## 2021-11-19 ENCOUNTER — Other Ambulatory Visit: Payer: Self-pay

## 2021-11-19 DIAGNOSIS — O26899 Other specified pregnancy related conditions, unspecified trimester: Secondary | ICD-10-CM

## 2021-11-19 DIAGNOSIS — R109 Unspecified abdominal pain: Secondary | ICD-10-CM | POA: Diagnosis present

## 2021-11-19 DIAGNOSIS — O26891 Other specified pregnancy related conditions, first trimester: Secondary | ICD-10-CM | POA: Diagnosis not present

## 2021-11-19 DIAGNOSIS — Z3A01 Less than 8 weeks gestation of pregnancy: Secondary | ICD-10-CM | POA: Diagnosis not present

## 2021-11-19 DIAGNOSIS — O3680X0 Pregnancy with inconclusive fetal viability, not applicable or unspecified: Secondary | ICD-10-CM | POA: Insufficient documentation

## 2021-11-19 LAB — CBC
HCT: 38.3 % (ref 36.0–46.0)
Hemoglobin: 12.9 g/dL (ref 12.0–15.0)
MCH: 28.6 pg (ref 26.0–34.0)
MCHC: 33.7 g/dL (ref 30.0–36.0)
MCV: 84.9 fL (ref 80.0–100.0)
Platelets: 252 10*3/uL (ref 150–400)
RBC: 4.51 MIL/uL (ref 3.87–5.11)
RDW: 13.1 % (ref 11.5–15.5)
WBC: 6.3 10*3/uL (ref 4.0–10.5)
nRBC: 0 % (ref 0.0–0.2)

## 2021-11-19 LAB — URINALYSIS, ROUTINE W REFLEX MICROSCOPIC
Bilirubin Urine: NEGATIVE
Glucose, UA: NEGATIVE mg/dL
Hgb urine dipstick: NEGATIVE
Ketones, ur: 5 mg/dL — AB
Leukocytes,Ua: NEGATIVE
Nitrite: NEGATIVE
Protein, ur: NEGATIVE mg/dL
Specific Gravity, Urine: 1.025 (ref 1.005–1.030)
pH: 7 (ref 5.0–8.0)

## 2021-11-19 LAB — HCG, QUANTITATIVE, PREGNANCY: hCG, Beta Chain, Quant, S: 84041 m[IU]/mL — ABNORMAL HIGH (ref <5)

## 2021-11-19 LAB — WET PREP, GENITAL
Clue Cells Wet Prep HPF POC: NONE SEEN
Sperm: NONE SEEN
Trich, Wet Prep: NONE SEEN
WBC, Wet Prep HPF POC: <10 (ref <10)
Yeast Wet Prep HPF POC: NONE SEEN

## 2021-11-19 NOTE — Discharge Instructions (Addendum)
Return to care  If you have heavier bleeding that soaks through more than 2 pads per hour for an hour or more If you bleed so much that you feel like you might pass out or you do pass out If you have significant abdominal pain that is not improved with Tylenol      [facility] at [facility]  [address], [place], [place] [address]  [phone]  Center for [facility] at [facility]  [address], [place], [place] [address]  [phone]  Center for [facility] at [facility] [address], [place], [place] [address]  [phone]  Center for [facility] at [facility]  [address], [place], [place] [address]  [phone]  Center for [facility] at [facility]  [address]First floor), [place], [place] [address]  [phone]  Center for [facility] at [facility]  [address] [place], [place], [place] [address]  [phone]  [facility]  [address], [place], [place] [address]  [phone]  [facility]  [address] [place], [place], [place] [address]  [phone]  [facility]  [address] [place], [place] [address]  [phone]  [facility]  [address], [place], [place] [address]  [phone]  [facility]  [address], [place], [place] [address]  [phone]  [facility]   [address] [place], [place] [address]  [phone]  [facility]  [address], [place], [place] [address]   [phone]  [facility]  [address] [place], [place] [address]  [phone]         Safe Medications in Pregnancy   Acne: Benzoyl Peroxide Salicylic Acid  Backache/Headache: Tylenol: 2 regular strength every 4 hours OR              2 Extra strength every 6 hours  Colds/Coughs/Allergies: Benadryl (alcohol free) 25 mg every 6  hours as needed Breath right strips Claritin Cepacol throat lozenges Chloraseptic throat spray Cold-Eeze- up to three times per day Cough drops, alcohol free Flonase (by prescription only) Guaifenesin Mucinex Robitussin DM (plain only, alcohol free) Saline nasal spray/drops Sudafed (pseudoephedrine) & Actifed ** use only after [redacted] weeks gestation and if you do not have high blood pressure Tylenol Vicks Vaporub Zinc lozenges Zyrtec   Constipation: Colace Ducolax suppositories Fleet enema Glycerin suppositories Metamucil Milk of magnesia Miralax Senokot Smooth move tea  Diarrhea: Kaopectate Imodium A-D  *NO pepto Bismol  Hemorrhoids: Anusol Anusol HC Preparation H Tucks  Indigestion: Tums Maalox Mylanta Zantac  Pepcid  Insomnia: Benadryl (alcohol free) 25mg  every 6 hours as needed Tylenol PM Unisom, no Gelcaps  Leg Cramps: Tums MagGel  Nausea/Vomiting:  Bonine Dramamine Emetrol Ginger extract Sea bands Meclizine  Nausea medication to take during pregnancy:  Unisom (doxylamine succinate 25 mg tablets) Take one tablet daily at bedtime. If symptoms are not adequately controlled, the dose can be increased to a maximum recommended dose of two tablets daily (1/2 tablet in the morning, 1/2 tablet mid-afternoon and one at bedtime). Vitamin B6 100mg  tablets. Take one tablet twice a day (up to 200 mg per day).  Skin Rashes: Aveeno products Benadryl cream or 25mg  every 6 hours as needed Calamine Lotion 1% cortisone cream  Yeast infection: Gyne-lotrimin 7 Monistat 7  Gum/tooth  pain: Anbesol  **If taking multiple medications, please check labels to avoid duplicating the same active ingredients **take medication as directed on the label ** Do not exceed 4000 mg of tylenol in 24 hours **Do not take medications that contain aspirin or ibuprofen

## 2021-11-19 NOTE — MAU Provider Note (Signed)
History     [id number]  Arrival date and time: 11/19/21 1426    Chief Complaint  Patient presents with   Abdominal Pain     HPI Karen Curry is a 26 y.o. at [redacted]w[redacted]d by LMP who presents for abdominal pain. Reports pain since finding out she was pregnant.  Has had 2 ultrasound at [facility] in the last week that show an empty gestational sac & no growth. Was sent here for evaluation.  Had some brown vaginal spotting a few days ago. Other symptoms include decreased appetite, 3-4 loose stools per day for the last month, and increase in headaches. Denies fever/chills, n/v, dysuria, hematuria, or vaginal discharge.     OB History     Gravida  3   Para  2   Term  2   Preterm  0   AB  0   Living  2      SAB  0   IAB  0   Ectopic  0   Multiple  0   Live Births  2           Past Medical History:  Diagnosis Date   Asthma    Kidney stones    26 yo   UTI (urinary tract infection)     Past Surgical History:  Procedure Laterality Date   NO PAST SURGERIES      Family History  Problem Relation Age of Onset   Hypertension Father    Hypertension Mother     No Known Allergies  No current facility-administered medications on file prior to encounter.   Current Outpatient Medications on File Prior to Encounter  Medication Sig Dispense Refill   acetaminophen (TYLENOL) 325 MG tablet Take 2 tablets (650 mg total) by mouth every 4 (four) hours as needed (for pain scale < 4). 30 tablet 0   Prenatal Vit-Fe Fumarate-FA (PRENATAL 19) 29-1 MG CHEW Chew 1 tablet by mouth daily. 30 tablet 11     ROS Pertinent positives and negative per HPI, all others reviewed and negative  Physical Exam   BP 118/73   Pulse 78   Temp 99 F (37.2 C)   Resp 18   Ht 5\' 3"  (1.6 m)   Wt 92.3 kg   LMP 09/28/2021   BMI 36.05 kg/m   Patient Vitals for the past 24 hrs:  BP Temp Pulse Resp Height Weight  11/19/21 1441 118/73 99 F (37.2 C) 78 18 5\' 3"  (1.6 m) 92.3  kg    Physical Exam Vitals and nursing note reviewed.  Constitutional:      General: She is not in acute distress.    Appearance: She is well-developed. She is not ill-appearing or diaphoretic.  HENT:     Head: Normocephalic and atraumatic.  Pulmonary:     Effort: Pulmonary effort is normal. No respiratory distress.  Abdominal:     General: Abdomen is flat.     Palpations: Abdomen is soft.     Tenderness: There is no abdominal tenderness. There is no guarding or rebound.  Skin:    General: Skin is warm and dry.  Neurological:     Mental Status: She is alert.        Labs Results for orders placed or performed during the hospital encounter of 11/19/21 (from the past 24 hour(s))  Urinalysis, Routine w reflex microscopic Urine, Clean Catch     Status: Abnormal   Collection Time: 11/19/21  3:19 PM  Result Value Ref Range  Color, Urine YELLOW YELLOW   APPearance HAZY (A) CLEAR   Specific Gravity, Urine 1.025 1.005 - 1.030   pH 7.0 5.0 - 8.0   Glucose, UA NEGATIVE NEGATIVE mg/dL   Hgb urine dipstick NEGATIVE NEGATIVE   Bilirubin Urine NEGATIVE NEGATIVE   Ketones, ur 5 (A) NEGATIVE mg/dL   Protein, ur NEGATIVE NEGATIVE mg/dL   Nitrite NEGATIVE NEGATIVE   Leukocytes,Ua NEGATIVE NEGATIVE  Wet prep, genital     Status: None   Collection Time: 11/19/21  3:23 PM   Specimen: PATH Cytology Cervicovaginal Ancillary Only  Result Value Ref Range   Yeast Wet Prep HPF POC NONE SEEN NONE SEEN   Trich, Wet Prep NONE SEEN NONE SEEN   Clue Cells Wet Prep HPF POC NONE SEEN NONE SEEN   WBC, Wet Prep HPF POC <10 <10   Sperm NONE SEEN   CBC     Status: None   Collection Time: 11/19/21  3:42 PM  Result Value Ref Range   WBC 6.3 4.0 - 10.5 K/uL   RBC 4.51 3.87 - 5.11 MIL/uL   Hemoglobin 12.9 12.0 - 15.0 g/dL   HCT 92.1 19.4 - 17.4 %   MCV 84.9 80.0 - 100.0 fL   MCH 28.6 26.0 - 34.0 pg   MCHC 33.7 30.0 - 36.0 g/dL   RDW 08.1 44.8 - 18.5 %   Platelets 252 150 - 400 K/uL   nRBC 0.0 0.0  - 0.2 %  hCG, quantitative, pregnancy     Status: Abnormal   Collection Time: 11/19/21  3:42 PM  Result Value Ref Range   hCG, Beta Chain, Quant, S 84,041 (H) <5 mIU/mL    Imaging US OB LESS THAN 14 WEEKS WITH OB TRANSVAGINAL  Result Date: 11/19/2021 CLINICAL DATA:  Abdominal pain for the past 2 weeks. Estimated gestational age of [redacted] weeks, 3 days by LMP. EXAM: OBSTETRIC <14 WK [place] AND TRANSVAGINAL OB US TECHNIQUE: Both transabdominal and transvaginal ultrasound examinations were performed for complete evaluation of the gestation as well as the maternal uterus, adnexal regions, and pelvic cul-de-sac. Transvaginal technique was performed to assess early pregnancy. COMPARISON:  None Available. FINDINGS: Intrauterine gestational sac: Single. Yolk sac:  Not Visualized. Embryo:  Not Visualized. MSD: 23.6 mm   7 w   3 d Subchorionic hemorrhage:  Small subchorionic hemorrhage. Maternal uterus/adnexae: Unremarkable. IMPRESSION: Findings are suspicious but not yet definitive for failed pregnancy. Recommend follow-up US in 10-14 days for definitive diagnosis. This recommendation follows SRU consensus guidelines: Diagnostic Criteria for Nonviable Pregnancy Early in the First Trimester. Malva Limes Med 2013; 631:4970-26. Electronically Signed   By: Obie Dredge M.D.   On: 11/19/2021 16:36    MAU Course  Procedures Lab Orders         Wet prep, genital         Urinalysis, Routine w reflex microscopic Urine, Clean Catch         CBC         hCG, quantitative, pregnancy    No orders of the defined types were placed in this encounter.  Imaging Orders         US OB LESS THAN 14 WEEKS WITH OB TRANSVAGINAL         US OB Transvaginal      MDM RH positive  Wet prep & u/a negative  Ultrasound shows empty IUGS measuring 23.6 mm. Concerning for failed pregnancy but not yet definitive.  Patient provided ultrasound picture from [facility] from  Tuesday, shows IUGS measuring 20 mm.   Reviewed with Dr.  Crissie Reese. Will get f/u for confirmation.  Assessment and Plan   1. Pregnancy with uncertain fetal viability, single or unspecified fetus   2. Abdominal cramping affecting pregnancy   3. [redacted] weeks gestation of pregnancy    -F/u ultrasound ordered  -Discussed with patient this is likely a miscarriage. Reviewed SAB precautions & reasons to return to MAU -GC/CT pending   Judeth Horn, NP 11/19/21 5:11 PM

## 2021-11-19 NOTE — MAU Note (Signed)
.  Karen Curry is a 26 y.o. at Unknown here in MAU reporting: she had a positive pregnancy test at the beginning of the month. [facility] and [facility] told there was a sac but no baby. Went again Tuesday and same result was told to come to women's and be evaluated. Pt states she has been having cramping on and off since the begining of her pregnancy. Denies any vag bleeding or discharge.  LMP: 09/28/2021 Onset of complaint: 2weeks Pain score: 7 Vitals:   11/19/21 1441  BP: 118/73  Pulse: 78  Resp: 18  Temp: 99 F (37.2 C)     FHT:n/a Lab orders placed from triage:  u/a

## 2021-11-20 LAB — GC/CHLAMYDIA PROBE AMP (~~LOC~~) NOT AT ARMC
Chlamydia: NEGATIVE
Comment: NEGATIVE
Comment: NORMAL
Neisseria Gonorrhea: NEGATIVE

## 2021-12-21 ENCOUNTER — Inpatient Hospital Stay (HOSPITAL_COMMUNITY): Payer: Medicaid Other | Admitting: Certified Registered Nurse Anesthetist

## 2021-12-21 ENCOUNTER — Encounter (HOSPITAL_COMMUNITY): Payer: Self-pay | Admitting: Obstetrics & Gynecology

## 2021-12-21 ENCOUNTER — Inpatient Hospital Stay (HOSPITAL_COMMUNITY)
Admission: AD | Admit: 2021-12-21 | Discharge: 2021-12-21 | Disposition: A | Discharge status: Home / Self Care | Payer: Medicaid Other | Attending: Obstetrics and Gynecology | Admitting: Obstetrics and Gynecology

## 2021-12-21 ENCOUNTER — Other Ambulatory Visit: Payer: Self-pay

## 2021-12-21 ENCOUNTER — Encounter (HOSPITAL_COMMUNITY)
Admission: AD | Disposition: A | Discharge status: Home / Self Care | Payer: Self-pay | Source: Home / Self Care | Attending: Obstetrics and Gynecology

## 2021-12-21 ENCOUNTER — Inpatient Hospital Stay (HOSPITAL_COMMUNITY): Payer: Medicaid Other

## 2021-12-21 DIAGNOSIS — O034 Incomplete spontaneous abortion without complication: Secondary | ICD-10-CM | POA: Diagnosis not present

## 2021-12-21 DIAGNOSIS — O021 Missed abortion: Secondary | ICD-10-CM | POA: Insufficient documentation

## 2021-12-21 DIAGNOSIS — O081 Delayed or excessive hemorrhage following ectopic and molar pregnancy: Secondary | ICD-10-CM | POA: Insufficient documentation

## 2021-12-21 HISTORY — PX: DILATION AND EVACUATION: SHX1459

## 2021-12-21 HISTORY — PX: OPERATIVE ULTRASOUND: SHX5996

## 2021-12-21 LAB — CBC
HCT: 30.7 % — ABNORMAL LOW (ref 36.0–46.0)
HCT: 22.8 % — ABNORMAL LOW (ref 36.0–46.0)
Hemoglobin: 10.3 g/dL — ABNORMAL LOW (ref 12.0–15.0)
Hemoglobin: 7.3 g/dL — ABNORMAL LOW (ref 12.0–15.0)
MCH: 28.8 pg (ref 26.0–34.0)
MCH: 28.3 pg (ref 26.0–34.0)
MCHC: 33.6 g/dL (ref 30.0–36.0)
MCHC: 32 g/dL (ref 30.0–36.0)
MCV: 85.8 fL (ref 80.0–100.0)
MCV: 88.4 fL (ref 80.0–100.0)
Platelets: 251 10*3/uL (ref 150–400)
Platelets: 194 10*3/uL (ref 150–400)
RBC: 3.58 MIL/uL — ABNORMAL LOW (ref 3.87–5.11)
RBC: 2.58 MIL/uL — ABNORMAL LOW (ref 3.87–5.11)
RDW: 14 % (ref 11.5–15.5)
RDW: 14 % (ref 11.5–15.5)
WBC: 7.8 10*3/uL (ref 4.0–10.5)
WBC: 4.4 10*3/uL (ref 4.0–10.5)
nRBC: 0 % (ref 0.0–0.2)
nRBC: 0 % (ref 0.0–0.2)

## 2021-12-21 LAB — TYPE AND SCREEN
ABO/RH(D): AB POS
Antibody Screen: NEGATIVE

## 2021-12-21 SURGERY — DILATION AND EVACUATION, UTERUS
Anesthesia: General | Site: Uterus

## 2021-12-21 MED ORDER — FENTANYL CITRATE (PF) 100 MCG/2ML IJ SOLN: 25.0000 ug | INTRAMUSCULAR | Status: DC | PRN

## 2021-12-21 MED ORDER — PROPOFOL 10 MG/ML IV BOLUS
INTRAVENOUS | Status: DC | PRN
  Administered 2021-12-21: 200 mg via INTRAVENOUS

## 2021-12-21 MED ORDER — MIDAZOLAM HCL 2 MG/2ML IJ SOLN
INTRAMUSCULAR | Status: AC
  Filled 2021-12-21: qty 2

## 2021-12-21 MED ORDER — ONDANSETRON HCL 4 MG/2ML IJ SOLN
INTRAMUSCULAR | Status: DC | PRN
  Administered 2021-12-21: 4 mg via INTRAVENOUS

## 2021-12-21 MED ORDER — ONDANSETRON HCL 4 MG/2ML IJ SOLN
INTRAMUSCULAR | Status: AC
Start: 2021-12-21 — End: ?
  Filled 2021-12-21: qty 2

## 2021-12-21 MED ORDER — FENTANYL CITRATE (PF) 250 MCG/5ML IJ SOLN
INTRAMUSCULAR | Status: AC
  Filled 2021-12-21: qty 5

## 2021-12-21 MED ORDER — KETOROLAC TROMETHAMINE 30 MG/ML IJ SOLN
INTRAMUSCULAR | Status: DC | PRN
  Administered 2021-12-21: 30 mg via INTRAVENOUS

## 2021-12-21 MED ORDER — EPHEDRINE 5 MG/ML INJ
INTRAVENOUS | Status: AC
  Filled 2021-12-21: qty 5

## 2021-12-21 MED ORDER — PHENYLEPHRINE 80 MCG/ML (10ML) SYRINGE FOR IV PUSH (FOR BLOOD PRESSURE SUPPORT)
PREFILLED_SYRINGE | INTRAVENOUS | Status: DC | PRN
  Administered 2021-12-21: 160 ug via INTRAVENOUS
  Administered 2021-12-21 (×3): 80 ug via INTRAVENOUS
  Administered 2021-12-21: 160 ug via INTRAVENOUS
  Administered 2021-12-21: 80 ug via INTRAVENOUS
  Administered 2021-12-21: 160 ug via INTRAVENOUS
  Administered 2021-12-21: 80 ug via INTRAVENOUS
  Administered 2021-12-21: 160 ug via INTRAVENOUS

## 2021-12-21 MED ORDER — PHENYLEPHRINE HCL-NACL 20-0.9 MG/250ML-% IV SOLN
INTRAVENOUS | Status: DC | PRN
  Administered 2021-12-21: 40 ug/min via INTRAVENOUS

## 2021-12-21 MED ORDER — DEXAMETHASONE SODIUM PHOSPHATE 10 MG/ML IJ SOLN
INTRAMUSCULAR | Status: AC
  Filled 2021-12-21: qty 1

## 2021-12-21 MED ORDER — OXYCODONE HCL 5 MG PO TABS: 5.0000 mg | ORAL_TABLET | Freq: Once | ORAL | Status: DC | PRN

## 2021-12-21 MED ORDER — KETOROLAC TROMETHAMINE 30 MG/ML IJ SOLN
INTRAMUSCULAR | Status: AC
  Filled 2021-12-21: qty 1

## 2021-12-21 MED ORDER — LIDOCAINE 2% (20 MG/ML) 5 ML SYRINGE
INTRAMUSCULAR | Status: AC
  Filled 2021-12-21: qty 5

## 2021-12-21 MED ORDER — LACTATED RINGERS IV BOLUS
1000.0000 mL | Freq: Once | INTRAVENOUS | Status: AC
  Administered 2021-12-21: 1000 mL via INTRAVENOUS

## 2021-12-21 MED ORDER — CHLORHEXIDINE GLUCONATE 0.12 % MT SOLN
OROMUCOSAL | Status: AC
  Filled 2021-12-21: qty 15

## 2021-12-21 MED ORDER — ALBUMIN HUMAN 5 % IV SOLN: INTRAVENOUS | Status: DC | PRN

## 2021-12-21 MED ORDER — OXYCODONE HCL 5 MG/5ML PO SOLN: 5.0000 mg | Freq: Once | ORAL | Status: DC | PRN

## 2021-12-21 MED ORDER — LIDOCAINE 2% (20 MG/ML) 5 ML SYRINGE
INTRAMUSCULAR | Status: DC | PRN
  Administered 2021-12-21: 60 mg via INTRAVENOUS

## 2021-12-21 MED ORDER — SODIUM CHLORIDE 0.9 % IV SOLN: INTRAVENOUS | Status: DC

## 2021-12-21 MED ORDER — DOXYCYCLINE HYCLATE 100 MG IV SOLR
200.0000 mg | Freq: Once | INTRAVENOUS | Status: AC
  Administered 2021-12-21: 200 mg via INTRAVENOUS
  Filled 2021-12-21: qty 200

## 2021-12-21 MED ORDER — ORAL CARE MOUTH RINSE: 15.0000 mL | Freq: Once | OROMUCOSAL | Status: DC

## 2021-12-21 MED ORDER — FENTANYL CITRATE (PF) 250 MCG/5ML IJ SOLN
INTRAMUSCULAR | Status: DC | PRN
  Administered 2021-12-21 (×3): 25 ug via INTRAVENOUS

## 2021-12-21 MED ORDER — 0.9 % SODIUM CHLORIDE (POUR BTL) OPTIME
TOPICAL | Status: DC | PRN
  Administered 2021-12-21: 1000 mL

## 2021-12-21 MED ORDER — CHLORHEXIDINE GLUCONATE 0.12 % MT SOLN
15.0000 mL | Freq: Once | OROMUCOSAL | Status: DC
  Filled 2021-12-21: qty 15

## 2021-12-21 MED ORDER — LACTATED RINGERS IV SOLN: INTRAVENOUS | Status: DC

## 2021-12-21 MED ORDER — PHENYLEPHRINE 80 MCG/ML (10ML) SYRINGE FOR IV PUSH (FOR BLOOD PRESSURE SUPPORT)
PREFILLED_SYRINGE | INTRAVENOUS | Status: AC
  Filled 2021-12-21: qty 20

## 2021-12-21 MED ORDER — MIDAZOLAM HCL 2 MG/2ML IJ SOLN
INTRAMUSCULAR | Status: DC | PRN
  Administered 2021-12-21: 1 mg via INTRAVENOUS

## 2021-12-21 MED ORDER — DEXAMETHASONE SODIUM PHOSPHATE 10 MG/ML IJ SOLN
INTRAMUSCULAR | Status: DC | PRN
  Administered 2021-12-21: 10 mg via INTRAVENOUS

## 2021-12-21 MED ORDER — MORPHINE SULFATE (PF) 4 MG/ML IV SOLN
INTRAVENOUS | Status: AC
  Administered 2021-12-21: 4 mg via INTRAVENOUS
  Filled 2021-12-21: qty 1

## 2021-12-21 MED ORDER — PROPOFOL 10 MG/ML IV BOLUS
INTRAVENOUS | Status: AC
  Filled 2021-12-21: qty 20

## 2021-12-21 MED ORDER — MORPHINE SULFATE (PF) 4 MG/ML IV SOLN: 4.0000 mg | Freq: Once | INTRAVENOUS | Status: AC

## 2021-12-21 MED ORDER — IBUPROFEN 200 MG PO TABS: 600.0000 mg | ORAL_TABLET | Freq: Four times a day (QID) | ORAL | Status: DC | PRN

## 2021-12-21 MED ORDER — ONDANSETRON HCL 4 MG/2ML IJ SOLN: 4.0000 mg | Freq: Four times a day (QID) | INTRAMUSCULAR | Status: DC | PRN

## 2021-12-21 MED ORDER — EPHEDRINE SULFATE-NACL 50-0.9 MG/10ML-% IV SOSY
PREFILLED_SYRINGE | INTRAVENOUS | Status: DC | PRN
  Administered 2021-12-21 (×2): 5 mg via INTRAVENOUS

## 2021-12-21 SURGICAL SUPPLY — 15 items
CATH ROBINSON RED A/P 16FR (CATHETERS) ×5 IMPLANT
CNTNR URN SCR LID CUP LEK RST (MISCELLANEOUS) ×4 IMPLANT
CONT SPEC 4OZ STRL OR WHT (MISCELLANEOUS) ×4
GLOVE BIOGEL PI IND STRL 6.5 (GLOVE) ×4 IMPLANT
GLOVE BIOGEL PI IND STRL 7.0 (GLOVE) ×4 IMPLANT
GLOVE BIOGEL PI INDICATOR 6.5 (GLOVE) ×1
GLOVE BIOGEL PI INDICATOR 7.0 (GLOVE) ×1
GOWN STRL REUS W/ TWL LRG LVL3 (GOWN DISPOSABLE) ×8 IMPLANT
GOWN STRL REUS W/TWL LRG LVL3 (GOWN DISPOSABLE) ×8
KIT TURNOVER KIT B (KITS) ×5 IMPLANT
PACK VAGINAL MINOR WOMEN LF (CUSTOM PROCEDURE TRAY) ×5 IMPLANT
PAD OB MATERNITY 4.3X12.25 (PERSONAL CARE ITEMS) ×5 IMPLANT
TOWEL GREEN STERILE FF (TOWEL DISPOSABLE) ×10 IMPLANT
UNDERPAD 30X36 HEAVY ABSORB (UNDERPADS AND DIAPERS) ×5 IMPLANT
VACURETTE 10 RIGID CVD (CANNULA) ×2 IMPLANT

## 2021-12-21 NOTE — MAU Provider Note (Signed)
MAU Note Patient came in by EMS for heavy vaginal bleeding after taking misoprostol at home. She states she is followed by [facility] and they saw a baby that was 12 weeks in the office when she was diagnosed with miscarriage.  Labs just got drawn and BPs 120s/80s and HR in the 100s. I did a TAUS and there is a large amount of material in the uterus and some A-V flow seen. Patient states she's been NPO since yesterday  CNM Payne on phone with primary OB, but I told patient most likely will recommend a suction d&c and she should be able to go home after the procedure (procedure d/w her but pt not formally consented by me) and she should be able to go home after procedure.  Cornelia Copa MD Attending [facility] [employer] (Faculty Practice) 12/21/2021 Time: [phone]

## 2021-12-21 NOTE — Anesthesia Preprocedure Evaluation (Signed)
Anesthesia Evaluation  Patient identified by MRN, date of birth, ID band Patient awake    Reviewed: Allergy & Precautions, H&P , NPO status , Patient's Chart, lab work & pertinent test results  Airway Mallampati: II   Neck ROM: full    Dental   Pulmonary asthma ,    breath sounds clear to auscultation       Cardiovascular negative cardio ROS   Rhythm:regular Rate:Normal     Neuro/Psych    GI/Hepatic   Endo/Other    Renal/GU stones     Musculoskeletal   Abdominal   Peds  Hematology   Anesthesia Other Findings   Reproductive/Obstetrics                             Anesthesia Physical Anesthesia Plan  ASA: 2  Anesthesia Plan: General   Post-op Pain Management:    Induction: Intravenous  PONV Risk Score and Plan: 3 and Ondansetron, Dexamethasone, Midazolam and Treatment may vary due to age or medical condition  Airway Management Planned: LMA  Additional Equipment:   Intra-op Plan:   Post-operative Plan: Extubation in OR  Informed Consent: I have reviewed the patients History and Physical, chart, labs and discussed the procedure including the risks, benefits and alternatives for the proposed anesthesia with the patient or authorized representative who has indicated his/her understanding and acceptance.     Dental advisory given  Plan Discussed with: CRNA, Anesthesiologist and Surgeon  Anesthesia Plan Comments:         Anesthesia Quick Evaluation

## 2021-12-21 NOTE — Anesthesia Procedure Notes (Signed)
Procedure Name: LMA Insertion Date/Time: 12/21/2021 10:32 AM  Performed by: Audie Pinto, CRNAPre-anesthesia Checklist: Patient identified, Emergency Drugs available, Suction available and Patient being monitored Patient Re-evaluated:Patient Re-evaluated prior to induction Oxygen Delivery Method: Circle system utilized Preoxygenation: Pre-oxygenation with 100% oxygen Induction Type: IV induction LMA: LMA inserted LMA Size: 4.0 Placement Confirmation: positive ETCO2 Dental Injury: Teeth and Oropharynx as per pre-operative assessment

## 2021-12-21 NOTE — H&P (Addendum)
Karen Curry is an 26 y.o. female. [id number] @ [redacted]w[redacted]d by LMP with vaginal bleeding, incomplete abortion.   Diagnosed with missed abortion 6/28 in office and given cytotec. She had spotting afterward but no heavy bleeding. On 7/21 she returned for follow up in office, US showed "collapsing gestational sac c/w incomplete miscarriage". She was prescribed misoprostol but didn't take it because her bleeding was starting to increase.  This morning, she states she was having a bowel movement and she noticed bleeding increased significantly. She states cramping pain 10/10 and passing golfball size clots. She came to MAU via EMS  In MAU, Dr.Pickens performed bedside TAUS: "there is a large amount of material in the uterus and some A-V flow seen". HR 100s, Bps 120/80s. She received morphine for pain control   Pertinent Gynecological History: [id number], history of SVD x 2    Menstrual History: Patient's last menstrual period was 09/28/2021.    Past Medical History:  Diagnosis Date   Asthma    Kidney stones    26 yo   UTI (urinary tract infection)     Past Surgical History:  Procedure Laterality Date   NO PAST SURGERIES      Family History  Problem Relation Age of Onset   Hypertension Father    Hypertension Mother     Social History:  reports that she has never smoked. She has never used smokeless tobacco. She reports that she does not drink alcohol and does not use drugs.  Allergies: No Known Allergies  Medications Prior to Admission  Medication Sig Dispense Refill Last Dose   acetaminophen (TYLENOL) 325 MG tablet Take 2 tablets (650 mg total) by mouth every 4 (four) hours as needed (for pain scale < 4). 30 tablet 0    Prenatal Vit-Fe Fumarate-FA (PRENATAL 19) 29-1 MG CHEW Chew 1 tablet by mouth daily. 30 tablet 11     Review of Systems  Constitutional:  Negative for fever.  HENT:  Negative for sore throat.   Eyes:  Negative for visual disturbance.  Respiratory:  Negative for  shortness of breath.   Cardiovascular:  Negative for chest pain.  Gastrointestinal:  Positive for abdominal pain.  Genitourinary:  Positive for vaginal bleeding.  Musculoskeletal:  Negative for neck pain.  Skin:  Negative for rash.  Neurological:  Negative for dizziness and headaches.  Psychiatric/Behavioral:  Negative for confusion.     Blood pressure 115/76, pulse (!) 106, last menstrual period 09/28/2021, SpO2 100 %, unknown if currently breastfeeding. Physical Exam  Gen: patient appears uncomfortable intermittently with cramping  CVS: normal pulses, tachycardic Lungs: nonlabored respirations Abd: soft, minimal tenderness suprapubic Ext: no calf edema or tenderness Perineum: orange size clot at perineum  Results for orders placed or performed during the hospital encounter of 12/21/21 (from the past 24 hour(s))  CBC     Status: Abnormal   Collection Time: 12/21/21  8:20 AM  Result Value Ref Range   WBC 7.8 4.0 - 10.5 K/uL   RBC 3.58 (L) 3.87 - 5.11 MIL/uL   Hemoglobin 10.3 (L) 12.0 - 15.0 g/dL   HCT 51.7 (L) 61.6 - 07.3 %   MCV 85.8 80.0 - 100.0 fL   MCH 28.8 26.0 - 34.0 pg   MCHC 33.6 30.0 - 36.0 g/dL   RDW 71.0 62.6 - 94.8 %   Platelets 251 150 - 400 K/uL   nRBC 0.0 0.0 - 0.2 %  Type and screen [facility]     Status: None (Preliminary result)  Collection Time: 12/21/21  8:20 AM  Result Value Ref Range   ABO/RH(D) PENDING    Antibody Screen PENDING    Sample Expiration      12/24/2021,2359 Performed at [facility], [address], [place], [place] [address]     No results found.  Assessment/Plan: 26Y [id number] @ 106w0d by LMP with incomplete abortion, s/p cytotec, presenting today with increased bleeding and abdominal cramping. Bedside TAUS showed persistent intrauterine material. Given degree of bleeding and pain, recommend proceeding with suction dilation and curettage under ultrasound guidance. Patient agrees. Consent obtained, reviewed  risk of infection, bleeding, uterine perforation, retained products, blood transfusion. Consent signed. All questions answered. She has been NPO since last night. She is added on to main OR schedule for 9:45am. Hgb 10.3.  IV doxycycline ordered for prophylaxis.   Charlett Nose 12/21/2021, 8:47 AM  ADDENDUM: received Rhogam 11/25/21 in  office for weak D phenotype

## 2021-12-21 NOTE — Transfer of Care (Signed)
Immediate Anesthesia Transfer of Care Note  Patient: Karen Curry  Procedure(s) Performed: DILATATION AND EVACUATION (Uterus) OPERATIVE ULTRASOUND  Patient Location: PACU  Anesthesia Type:General  Level of Consciousness: drowsy and patient cooperative  Airway & Oxygen Therapy: Patient Spontanous Breathing and Patient connected to face mask oxygen  Post-op Assessment: Report given to RN and Post -op Vital signs reviewed and stable  Post vital signs: Reviewed and stable  Last Vitals:  Vitals Value Taken Time  BP 114/56 12/21/21 1117  Temp    Pulse 84 12/21/21 1121  Resp 13 12/21/21 1121  SpO2 100 % 12/21/21 1121  Vitals shown include unvalidated device data.  Last Pain:  Vitals:   12/21/21 0929  TempSrc: Oral         Complications: No notable events documented.

## 2021-12-21 NOTE — Brief Op Note (Signed)
12/21/2021  11:09 AM  PATIENT:  Karen Curry  26 y.o. female  PRE-OPERATIVE DIAGNOSIS:  incomplete abortion  POST-OPERATIVE DIAGNOSIS:  incomplete abortion  PROCEDURE:  Procedure(s): DILATATION AND EVACUATION OPERATIVE ULTRASOUND  SURGEON:  Surgeon(s) and Role:    * Lian Pounds, Terrance Mass, MD - Primary   ANESTHESIA:   general  EBL:  200 mL   BLOOD ADMINISTERED:none  DRAINS: none   LOCAL MEDICATIONS USED:  NONE  SPECIMEN:  Source of Specimen:  products of conception  DISPOSITION OF SPECIMEN:  PATHOLOGY  COUNTS:  YES  TOURNIQUET:  * No tourniquets in log *  DICTATION: .Note written in EPIC  PLAN OF CARE: Discharge to home after PACU  PATIENT DISPOSITION:  PACU - hemodynamically stable.   Delay start of Pharmacological VTE agent (>24hrs) due to surgical blood loss or risk of bleeding: yes

## 2021-12-21 NOTE — Anesthesia Postprocedure Evaluation (Signed)
Anesthesia Post Note  Patient: Karen Curry  Procedure(s) Performed: DILATATION AND EVACUATION (Uterus) OPERATIVE ULTRASOUND     Patient location during evaluation: PACU Anesthesia Type: General Level of consciousness: awake and alert Pain management: pain level controlled Vital Signs Assessment: post-procedure vital signs reviewed and stable Respiratory status: spontaneous breathing, nonlabored ventilation, respiratory function stable and patient connected to nasal cannula oxygen Cardiovascular status: blood pressure returned to baseline and stable Postop Assessment: no apparent nausea or vomiting Anesthetic complications: no   No notable events documented.  Last Vitals:  Vitals:   12/21/21 1220 12/21/21 1235  BP: (!) 99/58 113/66  Pulse:  79  Resp: 12 16  Temp:    SpO2:  100%    Last Pain:  Vitals:   12/21/21 1150  TempSrc:   PainSc: 0-No pain                 Belma Dyches S

## 2021-12-21 NOTE — Progress Notes (Cosign Needed Addendum)
Late Entry:   Event Date/Time   First Provider Initiated Contact with Patient 12/21/21 [phone]      S [name] is a 26 y.o. G3P2002 patient who presents to MAU today via EMS with complaint of vaginal bleeding and passing clots. Patient was seen in the office 3 weeks ago for a AB and was Rx's misoprostol. She was seen recently and noted to have incomplete AB and was recommended to repeat misoprostol dose. Patient had not repeated the dose because she started bleeding. After a BM this morning patient states she noticed severe abdominal pain and increased in vaginal bleeding, passing large clots. Currently rates pain a 10/10. She is on a towel that this heavily saturated with blood and has gone through 2 pads since arrival.    O BP 105/65 (BP Location: Right Arm)   Pulse 84   Temp (!) 97.2 F (36.2 C)   Resp 12   LMP 09/28/2021   SpO2 100%  Physical Exam Vitals and nursing note reviewed. Chaperone present: Patient visibly uncomfortable and unable to perform speculum exam at this time..  Constitutional:      General: She is in acute distress.     Appearance: Normal appearance.  HENT:     Head: Normocephalic.  Pulmonary:     Effort: Pulmonary effort is normal.  Genitourinary:    General: Normal vulva.     Vagina: Bleeding present.     Comments: Patient has a "orange sized" clot noted at vaginal introitus. CNM removed with ring forceps, but no overt active bleeding noted. Pads changed.  Musculoskeletal:     Cervical back: Normal range of motion.  Skin:    General: Skin is warm and dry.  Neurological:     Mental Status: She is alert and oriented to person, place, and time.  Psychiatric:        Mood and Affect: Mood normal.   Patient Vitals for the past 24 hrs:  BP Temp Temp src Pulse Resp SpO2  12/21/21 1205 (!) 105/59 -- -- 72 12 100 %  12/21/21 1150 105/65 -- -- 84 12 100 %  12/21/21 1135 110/61 -- -- 92 12 100 %  12/21/21 1120 (!) 114/56 (!) 97.2 F (36.2 C) -- 96 13  100 %  12/21/21 0929 -- 98.1 F (36.7 C) Oral -- -- --  12/21/21 0900 125/71 -- -- 97 18 100 %  12/21/21 0856 -- -- -- -- 18 --  12/21/21 0830 115/76 -- -- (!) 106 -- --  12/21/21 0829 125/87 -- -- 96 -- --  12/21/21 0825 130/87 -- -- 96 -- 100 %  12/21/21 0820 122/83 -- -- 88 -- 100 %  12/21/21 0815 111/79 -- -- (!) 101 -- --  12/21/21 0811 (!) 86/62 -- -- (!) 122 -- --  12/21/21 0808 96/72 -- -- (!) 114 -- --  12/21/21 0756 106/83 -- -- (!) 111 -- --    - CBC, CMP, Type & Screen, IV Bolus initiated.  Results for orders placed or performed during the hospital encounter of 12/21/21 (from the past 24 hour(s))  CBC     Status: Abnormal   Collection Time: 12/21/21  8:20 AM  Result Value Ref Range   WBC 7.8 4.0 - 10.5 K/uL   RBC 3.58 (L) 3.87 - 5.11 MIL/uL   Hemoglobin 10.3 (L) 12.0 - 15.0 g/dL   HCT 11.9 (L) 14.7 - 82.9 %   MCV 85.8 80.0 - 100.0 fL   MCH 28.8 26.0 -  34.0 pg   MCHC 33.6 30.0 - 36.0 g/dL   RDW 42.3 53.6 - 14.4 %   Platelets 251 150 - 400 K/uL   nRBC 0.0 0.0 - 0.2 %  Type and screen [facility]     Status: None   Collection Time: 12/21/21  8:20 AM  Result Value Ref Range   ABO/RH(D) AB POS    Antibody Screen NEG    Sample Expiration      12/24/2021,2359 Performed at [facility], [address] [place], [place] [address]   CBC     Status: Abnormal   Collection Time: 12/21/21 11:39 AM  Result Value Ref Range   WBC 4.4 4.0 - 10.5 K/uL   RBC 2.58 (L) 3.87 - 5.11 MIL/uL   Hemoglobin 7.3 (L) 12.0 - 15.0 g/dL   HCT 08.6 (L) 76.1 - 95.0 %   MCV 88.4 80.0 - 100.0 fL   MCH 28.3 26.0 - 34.0 pg   MCHC 32.0 30.0 - 36.0 g/dL   RDW 93.2 67.1 - 24.5 %   Platelets 194 150 - 400 K/uL   nRBC 0.0 0.0 - 0.2 %     - 08:16am CNM notified MAU Attending Dr. Vergie Living and requested him at patient bedside for bedside ultrasound and evaluation until [facility] on-call MD could arrive. Dr. Vergie Living to bedside (Please see MD note).   - 08:19 CNM called Dr.  Timothy Lasso to notify of patient arrival and status. CNM notified MD of arrival hemodynamic status versus acute instability and management at this time. On-Call MD is coming to bedside ASAP.  - 08:24 CNM notified MD of Dr. Vergie Living bedside ultrasound findings and patients hemodynamic stability normalizing at this time. MD to bedside.    A Medical screening exam complete Retained Products  Transfer of care to Dr. Vergie Living. Care then taken over by Dr. Timothy Lasso.   Carlynn Herald, CNM 12/21/2021 12:17 PM

## 2021-12-21 NOTE — MAU Note (Signed)
.  Karen Curry is a 26 y.o. at [redacted]w[redacted]d here in MAU reporting: EMS arrival given cytotec 3 weeks ago did not d much has been spotting since then. Went to OB appointent on Friday and was told not all the POC was gone. Told her to repeat the cytotec. Pt did not take it and started to bleed heavily this am. Base ball sized  clot noted on perineum  LMP: 09/28/21 Onset of complaint: this morning Pain score: 10 There were no vitals filed for this visit.    Lab orders placed from triage:   cbc, type and screen

## 2021-12-21 NOTE — MAU Note (Signed)
Dr. Vergie Living at bedside . U/s shows POC still remain in uterus. Plan to have D&C. He notified Dr. Timothy Lasso.

## 2021-12-21 NOTE — MAU Note (Signed)
Dr. Timothy Lasso at bedside assesses pt. Consent signed for [facility]

## 2021-12-21 NOTE — Op Note (Signed)
12/21/2021   11:09 AM   PATIENT:  Karen Curry  26 y.o. female   PRE-OPERATIVE DIAGNOSIS:  incomplete abortion   POST-OPERATIVE DIAGNOSIS:  incomplete abortion   PROCEDURE:  Procedure(s): DILATATION AND EVACUATION OPERATIVE ULTRASOUND   SURGEON:  Surgeon(s) and Role:    * Constanza Mincy, Terrance Mass, MD - Primary     ANESTHESIA:   general   EBL:  200 mL    BLOOD ADMINISTERED:none   DRAINS: none    LOCAL MEDICATIONS USED:  NONE   SPECIMEN:  Source of Specimen:  products of conception   DISPOSITION OF SPECIMEN:  PATHOLOGY   COUNTS:  YES   PLAN OF CARE: Discharge to home after PACU   PATIENT DISPOSITION:  PACU - hemodynamically stable.  FINDINGS: 9 week size anteverted uterus evacuated of products of conception   PROCEDURE DETAIL: The patient was appropriately consented and taken to the operating room where general anesthesia was administered. Doxycycline 200mg  was administered for infection prophylaxis. She was placed in the dorsal lithotomy position in stirrups. She was prepared and draped in normal sterile fashion. Time out was completed prior to starting the ocedure. A speculum was inserted into the vagina and approximately 200cc of blood clot were cleared from the vagina. The cervix was visualized.  The anterior lip of the cervix was grasped with a single-tooth tenaculum.   The following portion was performed under ultrasound guidance. Heterogeneous material and gestational sac were noted on . The cervix was already dilated to accommodate a 10 mm suction curette.The suction curette was advanced to the uterine fundus. The suction was then started. The products of conception were evacuated with the curette rotating on outward movement for a total of four passes. A sharp gentle curettage was then performed. Two final passes with the suction curette removed the remaining debris. The bedside transabdominal ultrasound demonstrated a thin homogeneous endometrial stripe. The tenaculum  was removed from the cervix and the puncture sites were noted to be hemostatic. Good hemostasis was noted at the cervical os. The patient tolerated the procedure well. Counts were correct times two. The patient was awakened and taken to the recovery room in stable condition.  A postoperative hemoglobin will be obtained in PACU due to preoperative bleeding. Her blood type is AB positive and she will not need Rhogam prior to discharge.   [place], MD 12/21/21  1:09 PM

## 2021-12-22 ENCOUNTER — Encounter (HOSPITAL_COMMUNITY): Payer: Self-pay | Admitting: Obstetrics and Gynecology

## 2021-12-22 LAB — SURGICAL PATHOLOGY

## 2021-12-23 ENCOUNTER — Inpatient Hospital Stay (HOSPITAL_COMMUNITY)
Admission: AD | Admit: 2021-12-23 | Discharge: 2021-12-24 | Disposition: A | Discharge status: Home / Self Care | Payer: Medicaid Other | Attending: Obstetrics and Gynecology | Admitting: Obstetrics and Gynecology

## 2021-12-23 ENCOUNTER — Other Ambulatory Visit: Payer: Self-pay

## 2021-12-23 DIAGNOSIS — R519 Headache, unspecified: Secondary | ICD-10-CM | POA: Insufficient documentation

## 2021-12-23 DIAGNOSIS — D500 Iron deficiency anemia secondary to blood loss (chronic): Secondary | ICD-10-CM | POA: Diagnosis not present

## 2021-12-23 LAB — CBC WITH DIFFERENTIAL/PLATELET
Abs Immature Granulocytes: 0.08 10*3/uL — ABNORMAL HIGH (ref 0.00–0.07)
Basophils Absolute: 0 10*3/uL (ref 0.0–0.1)
Basophils Relative: 0 %
Eosinophils Absolute: 0.1 10*3/uL (ref 0.0–0.5)
Eosinophils Relative: 1 %
HCT: 20 % — ABNORMAL LOW (ref 36.0–46.0)
Hemoglobin: 6.6 g/dL — CL (ref 12.0–15.0)
Immature Granulocytes: 1 %
Lymphocytes Relative: 36 %
Lymphs Abs: 2.8 10*3/uL (ref 0.7–4.0)
MCH: 28.8 pg (ref 26.0–34.0)
MCHC: 33 g/dL (ref 30.0–36.0)
MCV: 87.3 fL (ref 80.0–100.0)
Monocytes Absolute: 0.7 10*3/uL (ref 0.1–1.0)
Monocytes Relative: 9 %
Neutro Abs: 4.1 10*3/uL (ref 1.7–7.7)
Neutrophils Relative %: 53 %
Platelets: 223 10*3/uL (ref 150–400)
RBC: 2.29 MIL/uL — ABNORMAL LOW (ref 3.87–5.11)
RDW: 14.7 % (ref 11.5–15.5)
WBC: 7.8 10*3/uL (ref 4.0–10.5)
nRBC: 0.3 % — ABNORMAL HIGH (ref 0.0–0.2)

## 2021-12-23 LAB — BASIC METABOLIC PANEL
Anion gap: 5 (ref 5–15)
BUN: 7 mg/dL (ref 6–20)
CO2: 27 mmol/L (ref 22–32)
Calcium: 8.6 mg/dL — ABNORMAL LOW (ref 8.9–10.3)
Chloride: 107 mmol/L (ref 98–111)
Creatinine, Ser: 0.67 mg/dL (ref 0.44–1.00)
GFR, Estimated: >60 mL/min (ref >60)
Glucose, Bld: 101 mg/dL — ABNORMAL HIGH (ref 70–99)
Potassium: 3.5 mmol/L (ref 3.5–5.1)
Sodium: 139 mmol/L (ref 135–145)

## 2021-12-23 NOTE — ED Notes (Signed)
Report given Morrie Sheldon RN MAU

## 2021-12-23 NOTE — MAU Note (Signed)
ED called pt is HGB 6.8

## 2021-12-23 NOTE — MAU Provider Note (Signed)
Chief Complaint:  Headache   Event Date/Time   First Provider Initiated Contact with Patient 12/23/21 2355      HPI: Karen Curry is a 26 y.o. G3P2002 who presents to maternity admissions reporting constant severe headache for two days, since having a D&C for a missed abortion.  Took 1 Tylenol at 4pm with no relief. . She reports no significant vaginal bleeding, dizziness, n/v, or fever/chills.    Headache  This is a new problem. The current episode started in the past 7 days. The problem occurs constantly. The problem has been unchanged. The pain is located in the Frontal region. The pain does not radiate. The quality of the pain is described as aching and dull. Pertinent negatives include no abdominal pain, back pain, blurred vision, coughing, dizziness, fever, neck pain, sinus pressure or visual change. Nothing aggravates the symptoms. She has tried acetaminophen for the symptoms. The treatment provided no relief.   RN Note: Karen Curry is a 26 y.o. at [redacted]w[redacted]d here in MAU reporting: constant HA since Monday when the patient had a D/C surgery. Pt reports 500mg  Tylenol around 1600 with no relief.  Onset of complaint: Monday Pain score: 10/10  Past Medical History: Past Medical History:  Diagnosis Date   Asthma    Kidney stones    26 yo   UTI (urinary tract infection)     Past obstetric history: OB History  Gravida Para Term Preterm AB Living  3 2 2  0 0 2  SAB IAB Ectopic Multiple Live Births  0 0 0 0 2    # Outcome Date GA Lbr Len/2nd Weight Sex Delivery Anes PTL Lv  3 Current           2 Term 04/25/19 [redacted]w[redacted]d 13:16 / 00:10 2994 g M Vag-Spont EPI  LIV  1 Term 08/05/14 [redacted]w[redacted]d 11:59 / 01:11 2500 g F Vag-Spont EPI, Local  LIV    Past Surgical History: Past Surgical History:  Procedure Laterality Date   DILATION AND EVACUATION  12/21/2021   Procedure: DILATATION AND EVACUATION;  Surgeon: [redacted]w[redacted]d, MD;  Location: [facility] OR;  Service: Gynecology;;   NO PAST SURGERIES      OPERATIVE ULTRASOUND  12/21/2021   Procedure: OPERATIVE ULTRASOUND;  Surgeon: Charlett Nose, MD;  Location: [facility] OR;  Service: Gynecology;;    Family History: Family History  Problem Relation Age of Onset   Hypertension Father    Hypertension Mother     Social History: Social History   Tobacco Use   Smoking status: Never   Smokeless tobacco: Never  Vaping Use   Vaping Use: Never used  Substance Use Topics   Alcohol use: No    Alcohol/week: 0.0 standard drinks of alcohol   Drug use: No    Allergies: No Known Allergies  Meds:  Medications Prior to Admission  Medication Sig Dispense Refill Last Dose   acetaminophen (TYLENOL) 325 MG tablet Take 2 tablets (650 mg total) by mouth every 4 (four) hours as needed (for pain scale < 4). 30 tablet 0    ibuprofen (ADVIL) 200 MG tablet Take 3 tablets (600 mg total) by mouth every 6 (six) hours as needed.       I have reviewed patient's Past Medical Hx, Surgical Hx, Family Hx, Social Hx, medications and allergies.  ROS:  Review of Systems  Constitutional:  Negative for fever.  HENT:  Negative for sinus pressure.   Eyes:  Negative for blurred vision.  Respiratory:  Negative for cough.  Gastrointestinal:  Negative for abdominal pain.  Musculoskeletal:  Negative for back pain and neck pain.  Neurological:  Positive for headaches. Negative for dizziness.   Other systems negative     Physical Exam  Patient Vitals for the past 24 hrs:  BP Temp Temp src Pulse Resp SpO2 Height Weight  12/23/21 2351 108/61 98.2 F (36.8 C) Oral 78 18 98 % 5\' 4"  (1.626 m) 95 kg  12/23/21 2153 109/73 98.7 F (37.1 C) Oral 90 16 100 % -- --   Constitutional: Well-developed, well-nourished female in no acute distress.  Cardiovascular: normal rate  Respiratory: normal effort, no distress GI: Abd soft, non-tender.  Nondistended.  No rebound, No guarding.  Bowel Sounds audible  MS: Extremities nontender, no edema, normal ROM Neurologic: Alert and  oriented x 4.   Grossly nonfocal. GU: Neg CVAT. Skin:  Warm and Dry Psych:  Affect appropriate.   Labs: Results for orders placed or performed during the hospital encounter of 12/23/21 (from the past 24 hour(s))  CBC with Differential     Status: Abnormal   Collection Time: 12/23/21 11:01 PM  Result Value Ref Range   WBC 7.8 4.0 - 10.5 K/uL   RBC 2.29 (L) 3.87 - 5.11 MIL/uL   Hemoglobin 6.6 (LL) 12.0 - 15.0 g/dL   HCT 12/25/21 (L) 13.2 - 44.0 %   MCV 87.3 80.0 - 100.0 fL   MCH 28.8 26.0 - 34.0 pg   MCHC 33.0 30.0 - 36.0 g/dL   RDW 10.2 72.5 - 36.6 %   Platelets 223 150 - 400 K/uL   nRBC 0.3 (H) 0.0 - 0.2 %   Neutrophils Relative % 53 %   Neutro Abs 4.1 1.7 - 7.7 K/uL   Lymphocytes Relative 36 %   Lymphs Abs 2.8 0.7 - 4.0 K/uL   Monocytes Relative 9 %   Monocytes Absolute 0.7 0.1 - 1.0 K/uL   Eosinophils Relative 1 %   Eosinophils Absolute 0.1 0.0 - 0.5 K/uL   Basophils Relative 0 %   Basophils Absolute 0.0 0.0 - 0.1 K/uL   Immature Granulocytes 1 %   Abs Immature Granulocytes 0.08 (H) 0.00 - 0.07 K/uL  Basic metabolic panel     Status: Abnormal   Collection Time: 12/23/21 11:01 PM  Result Value Ref Range   Sodium 139 135 - 145 mmol/L   Potassium 3.5 3.5 - 5.1 mmol/L   Chloride 107 98 - 111 mmol/L   CO2 27 22 - 32 mmol/L   Glucose, Bld 101 (H) 70 - 99 mg/dL   BUN 7 6 - 20 mg/dL   Creatinine, Ser 12/25/21 0.44 - 1.00 mg/dL   Calcium 8.6 (L) 8.9 - 10.3 mg/dL   GFR, Estimated 3.47 >42 mL/min   Anion gap 5 5 - 15   --/--/AB POS (07/24 0820)  Imaging:   MAU Course/MDM: I have reviewed the triage vital signs and the nursing notes.   Pertinent labs & imaging results that were available during my care of the patient were reviewed by me and considered in my medical decision making (see chart for details).      I have reviewed her medical records including past results, notes and treatments.   I have ordered labs as follows:  CBC and BMET.   Hemoglobin noted to be 6.6, down from  postop value of 7.2    Consult Dr 01-15-2005. She recommends Venofer for anemia.  Orthostatic VS normal so will not administer blood. At this time.  Patient  is already on PO iron.  .   Treatments in MAU included Acetaminophen and caffeine and Flexeril which entirely relieved HA                                                Venofer given for blood loss anemia (lost blood prior to [facility]   Pt stable at time of discharge.  Assessment: POD #2 s/p D&C Severe headache, resolved Blood loss Anemia with no orthostatic changes  Plan: Discharge home Recommend Tylenol/caffeine for headache Continue oral iron Followup in office for D&C  Encouraged to return here or to other Urgent Care/ED if she develops worsening of symptoms, increase in pain, fever, or other concerning symptoms.   Wynelle Bourgeois CNM, MSN Certified Nurse-Midwife 12/23/2021 11:55 PM

## 2021-12-23 NOTE — MAU Note (Signed)
.  Karen Curry is a 26 y.o. at [redacted]w[redacted]d here in MAU reporting: constant HA since Monday when the patient had a D/C surgery. Pt reports 500mg  Tylenol around 1600 with no relief.  Onset of complaint: Monday Pain score: 10/10 Vitals:   12/23/21 2153  BP: 109/73  Pulse: 90  Resp: 16  Temp: 98.7 F (37.1 C)  SpO2: 100%      Lab orders placed from triage:

## 2021-12-23 NOTE — ED Provider Triage Note (Addendum)
Emergency Medicine Provider Triage Evaluation Note  Karen Curry , a 26 y.o. female  was evaluated in triage.  Pt complains of headache persisting since D&E on 12/21/21.  Some mild persistent vaginal bleeding from procedure.  Associated dizziness and transient blurry vision with headache.  Review of Systems  Positive: Headache, nausea, blurry vision, dizziness Negative: Vomiting, abdominal pain, syncope  Physical Exam  BP 109/73   Pulse 90   Temp 98.7 F (37.1 C) (Oral)   Resp 16   LMP 09/28/2021   SpO2 100%  Gen:   Awake, no distress   Resp:  Normal effort  MSK:   Moves extremities without difficulty  Other:  Photosensitivity, PERRL, EOMI, no meningismus.  Medical Decision Making  Medically screening exam initiated at 10:46 PM.  Appropriate orders placed.  Karen Curry was informed that the remainder of the evaluation will be completed by another provider, this initial triage assessment does not replace that evaluation, and the importance of remaining in the ED until their evaluation is complete.  Case discussed with MAU APP Hilda Lias who is agreeable to excepting this patient to her service given headache is persisted since procedure.  This chart was dictated using voice recognition software, Dragon. Despite the best efforts of this provider to proofread and correct errors, errors may still occur which can change documentation meaning.    Paris Lore, PA-C 12/23/21 2254    Paris Lore, PA-C 12/23/21 2255

## 2021-12-23 NOTE — ED Triage Notes (Signed)
Pt was BIB [facility] from home c/o ongoing headache since yesterday Monday. Headache associated with nausea, dizziness, and blurry vision. Denies Hx migraines. Was discharge yesterday from [facility] d/t miscarriage, headache has been ongoing since discharge.

## 2021-12-24 ENCOUNTER — Encounter (HOSPITAL_COMMUNITY): Payer: Self-pay | Admitting: Obstetrics and Gynecology

## 2021-12-24 DIAGNOSIS — R519 Headache, unspecified: Secondary | ICD-10-CM

## 2021-12-24 MED ORDER — CYCLOBENZAPRINE HCL 5 MG PO TABS
5.0000 mg | ORAL_TABLET | Freq: Once | ORAL | Status: AC
  Administered 2021-12-24: 5 mg via ORAL
  Filled 2021-12-24: qty 1

## 2021-12-24 MED ORDER — SODIUM CHLORIDE 0.9 % IV SOLN
500.0000 mg | Freq: Once | INTRAVENOUS | Status: AC
  Administered 2021-12-24: 500 mg via INTRAVENOUS
  Filled 2021-12-24: qty 25

## 2021-12-24 MED ORDER — SODIUM CHLORIDE 0.9 % IV SOLN: Freq: Once | INTRAVENOUS | Status: AC

## 2021-12-24 MED ORDER — ACETAMINOPHEN-CAFFEINE 500-65 MG PO TABS
2.0000 | ORAL_TABLET | Freq: Once | ORAL | Status: AC
Start: 2021-12-24 — End: 2021-12-24
  Administered 2021-12-24: 2 via ORAL
  Filled 2021-12-24: qty 2

## 2022-05-01 ENCOUNTER — Emergency Department (HOSPITAL_COMMUNITY): Payer: Medicaid Other

## 2022-05-01 ENCOUNTER — Inpatient Hospital Stay (HOSPITAL_COMMUNITY): Payer: Medicaid Other

## 2022-05-01 ENCOUNTER — Inpatient Hospital Stay (HOSPITAL_COMMUNITY)
Admission: EM | Admit: 2022-05-01 | Discharge: 2022-05-10 | DRG: 958 | Disposition: A | Discharge status: Rehabilitation Facility | Payer: Medicaid Other | Attending: Surgery | Admitting: Surgery

## 2022-05-01 DIAGNOSIS — S069XAD Unspecified intracranial injury with loss of consciousness status unknown, subsequent encounter: Secondary | ICD-10-CM | POA: Diagnosis not present

## 2022-05-01 DIAGNOSIS — S069X2S Unspecified intracranial injury with loss of consciousness of 31 minutes to 59 minutes, sequela: Secondary | ICD-10-CM | POA: Diagnosis not present

## 2022-05-01 DIAGNOSIS — S32432A Displaced fracture of anterior column [iliopubic] of left acetabulum, initial encounter for closed fracture: Principal | ICD-10-CM | POA: Diagnosis present

## 2022-05-01 DIAGNOSIS — E669 Obesity, unspecified: Secondary | ICD-10-CM | POA: Diagnosis present

## 2022-05-01 DIAGNOSIS — R609 Edema, unspecified: Secondary | ICD-10-CM | POA: Diagnosis not present

## 2022-05-01 DIAGNOSIS — K0889 Other specified disorders of teeth and supporting structures: Secondary | ICD-10-CM | POA: Diagnosis not present

## 2022-05-01 DIAGNOSIS — S36113A Laceration of liver, unspecified degree, initial encounter: Secondary | ICD-10-CM | POA: Diagnosis present

## 2022-05-01 DIAGNOSIS — S069X2A Unspecified intracranial injury with loss of consciousness of 31 minutes to 59 minutes, initial encounter: Secondary | ICD-10-CM | POA: Diagnosis not present

## 2022-05-01 DIAGNOSIS — S069XAA Unspecified intracranial injury with loss of consciousness status unknown, initial encounter: Secondary | ICD-10-CM | POA: Diagnosis not present

## 2022-05-01 DIAGNOSIS — S32442A Displaced fracture of posterior column [ilioischial] of left acetabulum, initial encounter for closed fracture: Secondary | ICD-10-CM | POA: Diagnosis present

## 2022-05-01 DIAGNOSIS — S27322A Contusion of lung, bilateral, initial encounter: Secondary | ICD-10-CM | POA: Diagnosis present

## 2022-05-01 DIAGNOSIS — S069X0S Unspecified intracranial injury without loss of consciousness, sequela: Secondary | ICD-10-CM | POA: Diagnosis not present

## 2022-05-01 DIAGNOSIS — Z6838 Body mass index (BMI) 38.0-38.9, adult: Secondary | ICD-10-CM | POA: Diagnosis not present

## 2022-05-01 DIAGNOSIS — S32402A Unspecified fracture of left acetabulum, initial encounter for closed fracture: Secondary | ICD-10-CM | POA: Diagnosis present

## 2022-05-01 DIAGNOSIS — J45909 Unspecified asthma, uncomplicated: Secondary | ICD-10-CM | POA: Diagnosis present

## 2022-05-01 DIAGNOSIS — F0630 Mood disorder due to known physiological condition, unspecified: Secondary | ICD-10-CM | POA: Diagnosis not present

## 2022-05-01 DIAGNOSIS — R319 Hematuria, unspecified: Secondary | ICD-10-CM | POA: Diagnosis present

## 2022-05-01 DIAGNOSIS — S32512A Fracture of superior rim of left pubis, initial encounter for closed fracture: Secondary | ICD-10-CM | POA: Diagnosis present

## 2022-05-01 DIAGNOSIS — Z23 Encounter for immunization: Secondary | ICD-10-CM

## 2022-05-01 DIAGNOSIS — Y92410 Unspecified street and highway as the place of occurrence of the external cause: Secondary | ICD-10-CM

## 2022-05-01 DIAGNOSIS — S069X3S Unspecified intracranial injury with loss of consciousness of 1 hour to 5 hours 59 minutes, sequela: Secondary | ICD-10-CM | POA: Diagnosis not present

## 2022-05-01 DIAGNOSIS — D62 Acute posthemorrhagic anemia: Secondary | ICD-10-CM | POA: Diagnosis not present

## 2022-05-01 DIAGNOSIS — S3282XA Multiple fractures of pelvis without disruption of pelvic ring, initial encounter for closed fracture: Secondary | ICD-10-CM | POA: Diagnosis not present

## 2022-05-01 DIAGNOSIS — K029 Dental caries, unspecified: Secondary | ICD-10-CM | POA: Diagnosis not present

## 2022-05-01 DIAGNOSIS — F4389 Other reactions to severe stress: Secondary | ICD-10-CM | POA: Diagnosis not present

## 2022-05-01 DIAGNOSIS — S3282XS Multiple fractures of pelvis without disruption of pelvic ring, sequela: Secondary | ICD-10-CM | POA: Diagnosis not present

## 2022-05-01 HISTORY — DX: Calculus of kidney: N20.0

## 2022-05-01 HISTORY — DX: Unspecified asthma, uncomplicated: J45.909

## 2022-05-01 LAB — COMPREHENSIVE METABOLIC PANEL
ALT: 95 U/L — ABNORMAL HIGH (ref 0–44)
AST: 159 U/L — ABNORMAL HIGH (ref 15–41)
Albumin: 3.5 g/dL (ref 3.5–5.0)
Alkaline Phosphatase: 65 U/L (ref 38–126)
Anion gap: 15 (ref 5–15)
BUN: 10 mg/dL (ref 6–20)
CO2: 22 mmol/L (ref 22–32)
Calcium: 9.2 mg/dL (ref 8.9–10.3)
Chloride: 102 mmol/L (ref 98–111)
Creatinine, Ser: 1.21 mg/dL — ABNORMAL HIGH (ref 0.44–1.00)
GFR, Estimated: >60 mL/min (ref >60)
Glucose, Bld: 182 mg/dL — ABNORMAL HIGH (ref 70–99)
Potassium: 5.1 mmol/L (ref 3.5–5.1)
Sodium: 139 mmol/L (ref 135–145)
Total Bilirubin: 1 mg/dL (ref 0.3–1.2)
Total Protein: 7.2 g/dL (ref 6.5–8.1)

## 2022-05-01 LAB — I-STAT CHEM 8, ED
BUN: 10 mg/dL (ref 6–20)
Calcium, Ion: 1.11 mmol/L — ABNORMAL LOW (ref 1.15–1.40)
Chloride: 103 mmol/L (ref 98–111)
Creatinine, Ser: 1 mg/dL (ref 0.44–1.00)
Glucose, Bld: 177 mg/dL — ABNORMAL HIGH (ref 70–99)
HCT: 38 % (ref 36.0–46.0)
Hemoglobin: 12.9 g/dL (ref 12.0–15.0)
Potassium: 3.6 mmol/L (ref 3.5–5.1)
Sodium: 140 mmol/L (ref 135–145)
TCO2: 24 mmol/L (ref 22–32)

## 2022-05-01 LAB — PROTIME-INR
INR: 1 (ref 0.8–1.2)
Prothrombin Time: 13.4 seconds (ref 11.4–15.2)

## 2022-05-01 LAB — CBC
HCT: 39.4 % (ref 36.0–46.0)
Hemoglobin: 12 g/dL (ref 12.0–15.0)
MCH: 25.1 pg — ABNORMAL LOW (ref 26.0–34.0)
MCHC: 30.5 g/dL (ref 30.0–36.0)
MCV: 82.4 fL (ref 80.0–100.0)
Platelets: 375 10*3/uL (ref 150–400)
RBC: 4.78 MIL/uL (ref 3.87–5.11)
RDW: 19.7 % — ABNORMAL HIGH (ref 11.5–15.5)
WBC: 14.7 10*3/uL — ABNORMAL HIGH (ref 4.0–10.5)
nRBC: 0 % (ref 0.0–0.2)

## 2022-05-01 LAB — LACTIC ACID, PLASMA: Lactic Acid, Venous: 4.8 mmol/L (ref 0.5–1.9)

## 2022-05-01 LAB — SAMPLE TO BLOOD BANK

## 2022-05-01 LAB — ETHANOL: Alcohol, Ethyl (B): <10 mg/dL (ref <10)

## 2022-05-01 MED ORDER — TETANUS-DIPHTH-ACELL PERTUSSIS 5-2.5-18.5 LF-MCG/0.5 IM SUSY
PREFILLED_SYRINGE | INTRAMUSCULAR | Status: AC
  Administered 2022-05-01: 0.5 mL via INTRAMUSCULAR
  Filled 2022-05-01: qty 0.5

## 2022-05-01 MED ORDER — TETANUS-DIPHTH-ACELL PERTUSSIS 5-2.5-18.5 LF-MCG/0.5 IM SUSY: 0.5000 mL | PREFILLED_SYRINGE | Freq: Once | INTRAMUSCULAR | Status: AC

## 2022-05-01 MED ORDER — KETAMINE HCL 50 MG/5ML IJ SOSY: 30.0000 mg | PREFILLED_SYRINGE | Freq: Once | INTRAMUSCULAR | Status: AC

## 2022-05-01 MED ORDER — MORPHINE SULFATE (PF) 2 MG/ML IV SOLN
2.0000 mg | INTRAVENOUS | Status: DC | PRN
  Administered 2022-05-01 (×2): 4 mg via INTRAVENOUS
  Administered 2022-05-01 – 2022-05-02 (×2): 2 mg via INTRAVENOUS
  Administered 2022-05-02 (×2): 4 mg via INTRAVENOUS
  Administered 2022-05-02: 2 mg via INTRAVENOUS
  Administered 2022-05-02: 4 mg via INTRAVENOUS
  Filled 2022-05-01: qty 1
  Filled 2022-05-01: qty 2
  Filled 2022-05-01: qty 1
  Filled 2022-05-01 (×4): qty 2
  Filled 2022-05-01: qty 1

## 2022-05-01 MED ORDER — SODIUM CHLORIDE 0.9 % IV SOLN: INTRAVENOUS | Status: DC

## 2022-05-01 MED ORDER — OXYCODONE HCL 5 MG PO TABS: 5.0000 mg | ORAL_TABLET | ORAL | Status: DC | PRN

## 2022-05-01 MED ORDER — ACETAMINOPHEN 325 MG PO TABS: 650.0000 mg | ORAL_TABLET | ORAL | Status: DC | PRN

## 2022-05-01 MED ORDER — ENOXAPARIN SODIUM 40 MG/0.4ML IJ SOSY: 40.0000 mg | PREFILLED_SYRINGE | INTRAMUSCULAR | Status: DC

## 2022-05-01 MED ORDER — KETAMINE HCL 50 MG/5ML IJ SOSY
PREFILLED_SYRINGE | INTRAMUSCULAR | Status: AC
  Administered 2022-05-01: 30 mg via INTRAVENOUS
  Filled 2022-05-01: qty 5

## 2022-05-01 MED ORDER — CEFAZOLIN SODIUM-DEXTROSE 2-4 GM/100ML-% IV SOLN
2.0000 g | Freq: Once | INTRAVENOUS | Status: AC
  Administered 2022-05-01: 2 g via INTRAVENOUS

## 2022-05-01 MED ORDER — DOCUSATE SODIUM 100 MG PO CAPS: 100.0000 mg | ORAL_CAPSULE | Freq: Two times a day (BID) | ORAL | Status: DC

## 2022-05-01 MED ORDER — LORAZEPAM 2 MG/ML IJ SOLN
INTRAMUSCULAR | Status: AC
  Filled 2022-05-01: qty 1

## 2022-05-01 MED ORDER — METOPROLOL TARTRATE 5 MG/5ML IV SOLN: 5.0000 mg | Freq: Four times a day (QID) | INTRAVENOUS | Status: DC | PRN

## 2022-05-01 MED ORDER — ONDANSETRON 4 MG PO TBDP: 4.0000 mg | ORAL_TABLET | Freq: Four times a day (QID) | ORAL | Status: DC | PRN

## 2022-05-01 MED ORDER — FENTANYL CITRATE PF 50 MCG/ML IJ SOSY: 100.0000 ug | PREFILLED_SYRINGE | Freq: Once | INTRAMUSCULAR | Status: AC

## 2022-05-01 MED ORDER — HYDRALAZINE HCL 20 MG/ML IJ SOLN: 10.0000 mg | INTRAMUSCULAR | Status: DC | PRN

## 2022-05-01 MED ORDER — FENTANYL CITRATE PF 50 MCG/ML IJ SOSY
PREFILLED_SYRINGE | INTRAMUSCULAR | Status: AC
  Administered 2022-05-01: 100 ug via INTRAVENOUS
  Filled 2022-05-01: qty 2

## 2022-05-01 MED ORDER — LIDOCAINE-EPINEPHRINE (PF) 2 %-1:200000 IJ SOLN
INTRAMUSCULAR | Status: AC
  Filled 2022-05-01: qty 20

## 2022-05-01 MED ORDER — IOHEXOL 350 MG/ML SOLN
75.0000 mL | Freq: Once | INTRAVENOUS | Status: AC | PRN
  Administered 2022-05-01: 75 mL via INTRAVENOUS

## 2022-05-01 MED ORDER — ONDANSETRON HCL 4 MG/2ML IJ SOLN
4.0000 mg | Freq: Four times a day (QID) | INTRAMUSCULAR | Status: DC | PRN
  Administered 2022-05-01 – 2022-05-02 (×2): 4 mg via INTRAVENOUS
  Filled 2022-05-01 (×2): qty 2
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
## 2022-05-01 NOTE — Progress Notes (Signed)
Patient discussed with Trauma MD.  Imaging reviewed.  There is a comminuted L acetabulum fracture which will likely benefit from skeletal traction pending definitive fixation.  Full consult to follow.  Ernestina Columbia M.D. Orthopaedic Surgery [facility]
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
## 2022-05-01 NOTE — Progress Notes (Signed)
Orthopedic Tech Progress Note Patient Details:  Karen Curry 1995-09-12 [id number]  Musculoskeletal Traction Type of Traction: Skeletal (Balanced Suspension) Traction Location: lle Traction Weight: 20 lbs  I assisted the ortho dr with traction application Post Interventions Patient Tolerated: Well Instructions Provided: Care of device, Adjustment of device  Trinna Post 05/01/2022, 10:04 PM
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
## 2022-05-01 NOTE — ED Notes (Signed)
Transported to CT 
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
## 2022-05-02 ENCOUNTER — Inpatient Hospital Stay (HOSPITAL_COMMUNITY): Payer: Medicaid Other

## 2022-05-02 ENCOUNTER — Encounter (HOSPITAL_COMMUNITY): Payer: Self-pay

## 2022-05-02 LAB — CBC
HCT: 32.9 % — ABNORMAL LOW (ref 36.0–46.0)
HCT: 25.3 % — ABNORMAL LOW (ref 36.0–46.0)
Hemoglobin: 10.6 g/dL — ABNORMAL LOW (ref 12.0–15.0)
Hemoglobin: 8 g/dL — ABNORMAL LOW (ref 12.0–15.0)
MCH: 25.7 pg — ABNORMAL LOW (ref 26.0–34.0)
MCH: 25.6 pg — ABNORMAL LOW (ref 26.0–34.0)
MCHC: 32.2 g/dL (ref 30.0–36.0)
MCHC: 31.6 g/dL (ref 30.0–36.0)
MCV: 79.7 fL — ABNORMAL LOW (ref 80.0–100.0)
MCV: 80.8 fL (ref 80.0–100.0)
Platelets: 289 10*3/uL (ref 150–400)
Platelets: 203 10*3/uL (ref 150–400)
RBC: 4.13 MIL/uL (ref 3.87–5.11)
RBC: 3.13 MIL/uL — ABNORMAL LOW (ref 3.87–5.11)
RDW: 19.6 % — ABNORMAL HIGH (ref 11.5–15.5)
RDW: 19.6 % — ABNORMAL HIGH (ref 11.5–15.5)
WBC: 9.2 10*3/uL (ref 4.0–10.5)
WBC: 7.1 10*3/uL (ref 4.0–10.5)
nRBC: 0 % (ref 0.0–0.2)
nRBC: 0 % (ref 0.0–0.2)

## 2022-05-02 LAB — BASIC METABOLIC PANEL
Anion gap: 10 (ref 5–15)
BUN: 12 mg/dL (ref 6–20)
CO2: 24 mmol/L (ref 22–32)
Calcium: 8.6 mg/dL — ABNORMAL LOW (ref 8.9–10.3)
Chloride: 105 mmol/L (ref 98–111)
Creatinine, Ser: 1.04 mg/dL — ABNORMAL HIGH (ref 0.44–1.00)
GFR, Estimated: >60 mL/min (ref >60)
Glucose, Bld: 146 mg/dL — ABNORMAL HIGH (ref 70–99)
Potassium: 4.1 mmol/L (ref 3.5–5.1)
Sodium: 139 mmol/L (ref 135–145)

## 2022-05-02 LAB — RAPID URINE DRUG SCREEN, HOSP PERFORMED
Amphetamines: NOT DETECTED
Barbiturates: NOT DETECTED
Benzodiazepines: NOT DETECTED
Cocaine: NOT DETECTED
Opiates: POSITIVE — AB
Tetrahydrocannabinol: POSITIVE — AB

## 2022-05-02 LAB — URINALYSIS, ROUTINE W REFLEX MICROSCOPIC
Bilirubin Urine: NEGATIVE
Glucose, UA: NEGATIVE mg/dL
Ketones, ur: NEGATIVE mg/dL
Leukocytes,Ua: NEGATIVE
Nitrite: NEGATIVE
Protein, ur: 100 mg/dL — AB
RBC / HPF: >50 RBC/hpf — ABNORMAL HIGH (ref 0–5)
Specific Gravity, Urine: >1.046 — ABNORMAL HIGH (ref 1.005–1.030)
pH: 5 (ref 5.0–8.0)

## 2022-05-02 LAB — ABO/RH: ABO/RH(D): AB POS

## 2022-05-02 MED ORDER — CEFAZOLIN SODIUM-DEXTROSE 2-4 GM/100ML-% IV SOLN
2.0000 g | INTRAVENOUS | Status: AC
  Administered 2022-05-03: 2 g via INTRAVENOUS
  Filled 2022-05-02: qty 100

## 2022-05-02 MED ORDER — OXYCODONE HCL 5 MG PO TABS
5.0000 mg | ORAL_TABLET | ORAL | Status: DC | PRN
  Administered 2022-05-02: 10 mg via ORAL
  Filled 2022-05-02: qty 2

## 2022-05-02 MED ORDER — METHOCARBAMOL 1000 MG/10ML IJ SOLN
500.0000 mg | Freq: Four times a day (QID) | INTRAVENOUS | Status: DC
  Administered 2022-05-02 – 2022-05-03 (×4): 500 mg via INTRAVENOUS
  Filled 2022-05-02: qty 500
  Filled 2022-05-02 (×2): qty 5
  Filled 2022-05-02 (×2): qty 500
  Filled 2022-05-02 (×4): qty 5

## 2022-05-02 MED ORDER — ACETAMINOPHEN 500 MG PO TABS
1000.0000 mg | ORAL_TABLET | Freq: Four times a day (QID) | ORAL | Status: DC
  Administered 2022-05-02 – 2022-05-10 (×24): 1000 mg via ORAL
  Filled 2022-05-02 (×25): qty 2

## 2022-05-02 MED ORDER — MORPHINE SULFATE (PF) 2 MG/ML IV SOLN
2.0000 mg | INTRAVENOUS | Status: DC | PRN
  Administered 2022-05-02 (×2): 4 mg via INTRAVENOUS
  Filled 2022-05-02 (×2): qty 2

## 2022-05-02 MED ORDER — IOHEXOL 350 MG/ML SOLN
50.0000 mL | Freq: Once | INTRAVENOUS | Status: AC | PRN
  Administered 2022-05-02: 50 mL
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
## 2022-05-02 NOTE — ED Provider Notes (Signed)
LACERATION REPAIR Performed by: [facility] Authorized by: Gwyneth Sprout Consent: Verbal consent obtained. Risks and benefits: risks, benefits and alternatives were discussed Consent given by: patient Patient identity confirmed: provided demographic data Prepped and Draped in normal sterile fashion Wound explored  Laceration Location: left lateral tib/fib  Laceration Length: 4cm  No Foreign Bodies seen or palpated  Anesthesia: none   Irrigation method: scrub Amount of cleaning: standard  Skin closure: staples  Number of sutures: 6  Technique: staples  Patient tolerance: Patient tolerated the procedure well with no immediate complications.    Gwyneth Sprout, MD 05/02/22 [phone]
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
## 2022-05-02 NOTE — ED Notes (Signed)
TRN and MD notified of pt elevated HR. Pt HR has been ranging from 110-135 BPM in her sleep. When awake pt HR elevates to 130-138 BPM. Pt is sleeping at this time.
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
## 2022-05-02 NOTE — ED Notes (Signed)
Patient awake and alert, trying to remove her C-collar, sitting up in bed, trying to get her right leg off the bed, patient is speaking word salad.  Will continue to monitor.
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
## 2022-05-02 NOTE — Progress Notes (Signed)
Noted UA with >50RBCs/hpf. CT A/P with bladder delays reviewed, no extrav noted. D/w Urology and given mechanism, recommend formal CT cysto, ordered.  Kris Mouton, MD

## 2022-05-02 NOTE — ED Notes (Signed)
..  Trauma Event Note    Reason for Call :  Primary RN [facility] contacted TRN concerned about increasing tachycardia awake and at rest. Pt is receiving IVF and pain medication.  Dr. Bedelia Person made aware of urine results, HGB and Vitals.   MD Notified:[id number]   Last imported Vital Signs BP 116/81 (BP Location: Left Arm)   Pulse (!) 120   Temp 98.6 F (37 C) (Axillary)   Resp (!) 23   Ht 5\' 3"  (1.6 m)   Wt 215 lb (97.5 kg)   SpO2 98%   BMI 38.09 kg/m   Trending CBC Recent Labs    05/01/22 1842 05/01/22 1850 05/02/22 0415  WBC 14.7*  --  9.2  HGB 12.0 12.9 10.6*  HCT 39.4 38.0 32.9*  PLT 375  --  289    Trending Coag's Recent Labs    05/01/22 1842  INR 1.0    Trending BMET Recent Labs    05/01/22 1842 05/01/22 1850 05/02/22 0415  NA 139 140 139  K 5.1 3.6 4.1  CL 102 103 105  CO2 22  --  24  BUN 10 10 12   CREATININE 1.21* 1.00 1.04*  GLUCOSE 182* 177* 146*      Karen Curry  Trauma Response RN  Please call TRN at [phone] for further assistance.

## 2022-05-03 ENCOUNTER — Inpatient Hospital Stay (HOSPITAL_COMMUNITY): Payer: Medicaid Other

## 2022-05-03 ENCOUNTER — Encounter (HOSPITAL_COMMUNITY): Payer: Self-pay

## 2022-05-03 ENCOUNTER — Other Ambulatory Visit: Payer: Self-pay

## 2022-05-03 ENCOUNTER — Inpatient Hospital Stay (HOSPITAL_COMMUNITY): Payer: Medicaid Other | Admitting: Certified Registered Nurse Anesthetist

## 2022-05-03 ENCOUNTER — Encounter (HOSPITAL_COMMUNITY): Admission: EM | Disposition: A | Discharge status: Rehabilitation Facility | Payer: Self-pay | Source: Home / Self Care

## 2022-05-03 DIAGNOSIS — S32402A Unspecified fracture of left acetabulum, initial encounter for closed fracture: Secondary | ICD-10-CM

## 2022-05-03 HISTORY — PX: OPEN REDUCTION INTERNAL FIXATION ACETABULAR FRACTURE STOPPA: SHX6831

## 2022-05-03 LAB — CBC
HCT: 24.5 % — ABNORMAL LOW (ref 36.0–46.0)
Hemoglobin: 7.8 g/dL — ABNORMAL LOW (ref 12.0–15.0)
MCH: 25.6 pg — ABNORMAL LOW (ref 26.0–34.0)
MCHC: 31.8 g/dL (ref 30.0–36.0)
MCV: 80.3 fL (ref 80.0–100.0)
Platelets: 169 10*3/uL (ref 150–400)
RBC: 3.05 MIL/uL — ABNORMAL LOW (ref 3.87–5.11)
RDW: 19.8 % — ABNORMAL HIGH (ref 11.5–15.5)
WBC: 7.1 10*3/uL (ref 4.0–10.5)
nRBC: 0 % (ref 0.0–0.2)

## 2022-05-03 LAB — BASIC METABOLIC PANEL
Anion gap: 6 (ref 5–15)
BUN: 9 mg/dL (ref 6–20)
CO2: 24 mmol/L (ref 22–32)
Calcium: 7.9 mg/dL — ABNORMAL LOW (ref 8.9–10.3)
Chloride: 106 mmol/L (ref 98–111)
Creatinine, Ser: 0.59 mg/dL (ref 0.44–1.00)
GFR, Estimated: >60 mL/min (ref >60)
Glucose, Bld: 112 mg/dL — ABNORMAL HIGH (ref 70–99)
Potassium: 3.8 mmol/L (ref 3.5–5.1)
Sodium: 136 mmol/L (ref 135–145)

## 2022-05-03 LAB — POCT I-STAT EG7
Acid-Base Excess: 1 mmol/L (ref 0.0–2.0)
Bicarbonate: 25.6 mmol/L (ref 20.0–28.0)
Calcium, Ion: 1.12 mmol/L — ABNORMAL LOW (ref 1.15–1.40)
HCT: 28 % — ABNORMAL LOW (ref 36.0–46.0)
Hemoglobin: 9.5 g/dL — ABNORMAL LOW (ref 12.0–15.0)
O2 Saturation: 97 %
Potassium: 4.7 mmol/L (ref 3.5–5.1)
Sodium: 137 mmol/L (ref 135–145)
TCO2: 27 mmol/L (ref 22–32)
pCO2, Ven: 42.1 mmHg — ABNORMAL LOW (ref 44–60)
pH, Ven: 7.393 (ref 7.25–7.43)
pO2, Ven: 88 mmHg — ABNORMAL HIGH (ref 32–45)

## 2022-05-03 LAB — POCT PREGNANCY, URINE: Preg Test, Ur: NEGATIVE

## 2022-05-03 LAB — LACTIC ACID, PLASMA: Lactic Acid, Venous: 1 mmol/L (ref 0.5–1.9)

## 2022-05-03 LAB — PREPARE RBC (CROSSMATCH)

## 2022-05-03 SURGERY — OPEN REDUCTION INTERNAL FIXATION ACETABULAR FRACTURE STOPPA
Anesthesia: General | Laterality: Left

## 2022-05-03 MED ORDER — OXYCODONE HCL 5 MG PO TABS
10.0000 mg | ORAL_TABLET | ORAL | Status: DC | PRN
  Administered 2022-05-04 (×2): 15 mg via ORAL
  Administered 2022-05-04 – 2022-05-05 (×2): 10 mg via ORAL
  Filled 2022-05-03: qty 2
  Filled 2022-05-03 (×2): qty 3

## 2022-05-03 MED ORDER — METOCLOPRAMIDE HCL 10 MG PO TABS: 5.0000 mg | ORAL_TABLET | Freq: Three times a day (TID) | ORAL | Status: DC | PRN

## 2022-05-03 MED ORDER — CEFAZOLIN SODIUM-DEXTROSE 2-4 GM/100ML-% IV SOLN
2.0000 g | Freq: Three times a day (TID) | INTRAVENOUS | Status: AC
  Administered 2022-05-03 – 2022-05-04 (×3): 2 g via INTRAVENOUS
  Filled 2022-05-03 (×3): qty 100

## 2022-05-03 MED ORDER — DEXAMETHASONE SODIUM PHOSPHATE 10 MG/ML IJ SOLN
INTRAMUSCULAR | Status: AC
  Filled 2022-05-03: qty 1

## 2022-05-03 MED ORDER — HYDROMORPHONE HCL 1 MG/ML IJ SOLN
INTRAMUSCULAR | Status: AC
  Filled 2022-05-03: qty 0.5

## 2022-05-03 MED ORDER — ONDANSETRON HCL 4 MG/2ML IJ SOLN
INTRAMUSCULAR | Status: DC | PRN
  Administered 2022-05-03: 4 mg via INTRAVENOUS

## 2022-05-03 MED ORDER — 0.9 % SODIUM CHLORIDE (POUR BTL) OPTIME
TOPICAL | Status: DC | PRN
  Administered 2022-05-03 (×2): 500 mL

## 2022-05-03 MED ORDER — HYDROMORPHONE HCL 1 MG/ML IJ SOLN
0.5000 mg | INTRAMUSCULAR | Status: DC | PRN
  Administered 2022-05-03 – 2022-05-04 (×4): 1 mg via INTRAVENOUS
  Filled 2022-05-03 (×4): qty 1

## 2022-05-03 MED ORDER — POLYETHYLENE GLYCOL 3350 17 G PO PACK: 17.0000 g | PACK | Freq: Every day | ORAL | Status: DC | PRN

## 2022-05-03 MED ORDER — ACETAMINOPHEN 10 MG/ML IV SOLN
INTRAVENOUS | Status: AC
  Filled 2022-05-03: qty 100

## 2022-05-03 MED ORDER — FENTANYL CITRATE (PF) 250 MCG/5ML IJ SOLN
INTRAMUSCULAR | Status: AC
  Filled 2022-05-03: qty 5

## 2022-05-03 MED ORDER — ACETAMINOPHEN 10 MG/ML IV SOLN
INTRAVENOUS | Status: DC | PRN
  Administered 2022-05-03: 1000 mg via INTRAVENOUS

## 2022-05-03 MED ORDER — DOCUSATE SODIUM 100 MG PO CAPS
100.0000 mg | ORAL_CAPSULE | Freq: Two times a day (BID) | ORAL | Status: DC
  Administered 2022-05-04 – 2022-05-09 (×10): 100 mg via ORAL
  Filled 2022-05-03 (×10): qty 1

## 2022-05-03 MED ORDER — LACTATED RINGERS IV SOLN: INTRAVENOUS | Status: DC | PRN

## 2022-05-03 MED ORDER — PROPOFOL 10 MG/ML IV BOLUS
INTRAVENOUS | Status: AC
  Filled 2022-05-03: qty 20

## 2022-05-03 MED ORDER — TRANEXAMIC ACID-NACL 1000-0.7 MG/100ML-% IV SOLN
1000.0000 mg | Freq: Once | INTRAVENOUS | Status: AC
  Administered 2022-05-03: 1000 mg via INTRAVENOUS
  Filled 2022-05-03: qty 100

## 2022-05-03 MED ORDER — PROPOFOL 10 MG/ML IV BOLUS
INTRAVENOUS | Status: DC | PRN
  Administered 2022-05-03: 170 mg via INTRAVENOUS

## 2022-05-03 MED ORDER — SUGAMMADEX SODIUM 200 MG/2ML IV SOLN
INTRAVENOUS | Status: DC | PRN
  Administered 2022-05-03: 200 mg via INTRAVENOUS

## 2022-05-03 MED ORDER — METHOCARBAMOL 500 MG PO TABS
500.0000 mg | ORAL_TABLET | Freq: Four times a day (QID) | ORAL | Status: DC | PRN
  Administered 2022-05-04 (×2): 500 mg via ORAL
  Filled 2022-05-03 (×2): qty 1

## 2022-05-03 MED ORDER — TOBRAMYCIN SULFATE 1.2 G IJ SOLR
INTRAMUSCULAR | Status: DC | PRN
  Administered 2022-05-03: 1.2 g via TOPICAL

## 2022-05-03 MED ORDER — FENTANYL CITRATE (PF) 100 MCG/2ML IJ SOLN
INTRAMUSCULAR | Status: DC | PRN
  Administered 2022-05-03 (×5): 50 ug via INTRAVENOUS

## 2022-05-03 MED ORDER — MIDAZOLAM HCL 2 MG/2ML IJ SOLN
INTRAMUSCULAR | Status: AC
  Filled 2022-05-03: qty 2

## 2022-05-03 MED ORDER — TRANEXAMIC ACID-NACL 1000-0.7 MG/100ML-% IV SOLN
INTRAVENOUS | Status: DC | PRN
  Administered 2022-05-03: 1000 mg via INTRAVENOUS

## 2022-05-03 MED ORDER — METHOCARBAMOL 1000 MG/10ML IJ SOLN
500.0000 mg | Freq: Four times a day (QID) | INTRAVENOUS | Status: DC | PRN
  Filled 2022-05-03: qty 5

## 2022-05-03 MED ORDER — HYDROMORPHONE HCL 1 MG/ML IJ SOLN
INTRAMUSCULAR | Status: DC | PRN
  Administered 2022-05-03 (×3): .5 mg via INTRAVENOUS

## 2022-05-03 MED ORDER — DEXAMETHASONE SODIUM PHOSPHATE 10 MG/ML IJ SOLN
INTRAMUSCULAR | Status: DC | PRN
  Administered 2022-05-03: 10 mg via INTRAVENOUS

## 2022-05-03 MED ORDER — SODIUM CHLORIDE 0.9 % IV SOLN: 10.0000 mL/h | Freq: Once | INTRAVENOUS | Status: DC

## 2022-05-03 MED ORDER — OXYCODONE HCL 5 MG PO TABS
5.0000 mg | ORAL_TABLET | ORAL | Status: DC | PRN
  Administered 2022-05-04 – 2022-05-05 (×3): 10 mg via ORAL
  Filled 2022-05-03 (×4): qty 2

## 2022-05-03 MED ORDER — LIDOCAINE 2% (20 MG/ML) 5 ML SYRINGE
INTRAMUSCULAR | Status: AC
  Filled 2022-05-03: qty 5

## 2022-05-03 MED ORDER — LIDOCAINE HCL (CARDIAC) PF 100 MG/5ML IV SOSY
PREFILLED_SYRINGE | INTRAVENOUS | Status: DC | PRN
  Administered 2022-05-03: 60 mg via INTRAVENOUS

## 2022-05-03 MED ORDER — TRANEXAMIC ACID-NACL 1000-0.7 MG/100ML-% IV SOLN
INTRAVENOUS | Status: AC
  Filled 2022-05-03: qty 100

## 2022-05-03 MED ORDER — TOBRAMYCIN SULFATE 1.2 G IJ SOLR
INTRAMUSCULAR | Status: AC
  Filled 2022-05-03: qty 1.2

## 2022-05-03 MED ORDER — VANCOMYCIN HCL 1 G IV SOLR
INTRAVENOUS | Status: DC | PRN
  Administered 2022-05-03: 1000 mg via TOPICAL

## 2022-05-03 MED ORDER — METOCLOPRAMIDE HCL 5 MG/ML IJ SOLN: 5.0000 mg | Freq: Three times a day (TID) | INTRAMUSCULAR | Status: DC | PRN

## 2022-05-03 MED ORDER — ROCURONIUM BROMIDE 10 MG/ML (PF) SYRINGE
PREFILLED_SYRINGE | INTRAVENOUS | Status: AC
  Filled 2022-05-03: qty 20

## 2022-05-03 MED ORDER — ROCURONIUM BROMIDE 100 MG/10ML IV SOLN
INTRAVENOUS | Status: DC | PRN
  Administered 2022-05-03: 50 mg via INTRAVENOUS
  Administered 2022-05-03: 10 mg via INTRAVENOUS
  Administered 2022-05-03: 20 mg via INTRAVENOUS
  Administered 2022-05-03: 30 mg via INTRAVENOUS
  Administered 2022-05-03: 50 mg via INTRAVENOUS

## 2022-05-03 MED ORDER — VANCOMYCIN HCL 1000 MG IV SOLR
INTRAVENOUS | Status: AC
  Filled 2022-05-03: qty 20

## 2022-05-03 MED ORDER — SODIUM CHLORIDE 0.9% IV SOLUTION: Freq: Once | INTRAVENOUS | Status: DC

## 2022-05-03 MED ORDER — ONDANSETRON HCL 4 MG/2ML IJ SOLN
INTRAMUSCULAR | Status: AC
  Filled 2022-05-03: qty 2

## 2022-05-03 MED ORDER — CEFAZOLIN SODIUM-DEXTROSE 2-3 GM-%(50ML) IV SOLR
INTRAVENOUS | Status: DC | PRN
  Administered 2022-05-03: 2 g via INTRAVENOUS

## 2022-05-03 SURGICAL SUPPLY — 78 items
ADH SKN CLS APL DERMABOND .7 (GAUZE/BANDAGES/DRESSINGS) ×2
APL PRP STRL LF DISP 70% ISPRP (MISCELLANEOUS) ×1
APPLIER CLIP 11 MED OPEN (CLIP) ×1
APR CLP MED 11 20 MLT OPN (CLIP) ×1
BAG COUNTER SPONGE SURGICOUNT (BAG) ×2 IMPLANT
BAG SPNG CNTER NS LX DISP (BAG) ×1
BIT DRILL AO MATTA 2.5MX230M (BIT) IMPLANT
BLADE CLIPPER SURG (BLADE) IMPLANT
BRUSH SCRUB EZ PLAIN DRY (MISCELLANEOUS) ×4 IMPLANT
CHLORAPREP W/TINT 26 (MISCELLANEOUS) ×2 IMPLANT
CLIP APPLIE 11 MED OPEN (CLIP) IMPLANT
COVER SURGICAL LIGHT HANDLE (MISCELLANEOUS) ×2 IMPLANT
DERMABOND ADVANCED .7 DNX12 (GAUZE/BANDAGES/DRESSINGS) IMPLANT
DRAPE C-ARM 42X72 X-RAY (DRAPES) ×2 IMPLANT
DRAPE C-ARMOR (DRAPES) ×2 IMPLANT
DRAPE INCISE IOBAN 66X45 STRL (DRAPES) ×2 IMPLANT
DRAPE ORTHO SPLIT 77X108 STRL (DRAPES) ×1
DRAPE SURG 17X23 STRL (DRAPES) IMPLANT
DRAPE SURG ORHT 6 SPLT 77X108 (DRAPES) ×2 IMPLANT
DRAPE U-SHAPE 47X51 STRL (DRAPES) ×2 IMPLANT
DRESSING MEPILEX FLEX 4X4 (GAUZE/BANDAGES/DRESSINGS) IMPLANT
DRILL BIT AO MATTA 2.5MX230M (BIT) ×1
DRSG MEPILEX BORDER 4X12 (GAUZE/BANDAGES/DRESSINGS) IMPLANT
DRSG MEPILEX BORDER 4X8 (GAUZE/BANDAGES/DRESSINGS) IMPLANT
DRSG MEPILEX FLEX 4X4 (GAUZE/BANDAGES/DRESSINGS) ×2
DRSG MEPILEX POST OP 4X8 (GAUZE/BANDAGES/DRESSINGS) IMPLANT
ELECT BLADE 6.5 EXT (BLADE) ×2 IMPLANT
ELECT REM PT RETURN 9FT ADLT (ELECTROSURGICAL) ×1
ELECTRODE REM PT RTRN 9FT ADLT (ELECTROSURGICAL) ×2 IMPLANT
GLOVE BIO SURGEON STRL SZ 6.5 (GLOVE) ×6 IMPLANT
GLOVE BIO SURGEON STRL SZ7.5 (GLOVE) ×8 IMPLANT
GLOVE BIOGEL PI IND STRL 6.5 (GLOVE) ×2 IMPLANT
GLOVE BIOGEL PI IND STRL 7.5 (GLOVE) ×2 IMPLANT
GOWN STRL REUS W/ TWL LRG LVL3 (GOWN DISPOSABLE) ×6 IMPLANT
GOWN STRL REUS W/TWL LRG LVL3 (GOWN DISPOSABLE) ×3
GUIDEWIRE ASNIS 3.2 NONCAL (WIRE) IMPLANT
HANDPIECE INTERPULSE COAX TIP (DISPOSABLE) ×1
K-WIRE TROCAR NS 2.5X285 (MISCELLANEOUS) ×3
KIT BASIN OR (CUSTOM PROCEDURE TRAY) ×2 IMPLANT
KIT TURNOVER KIT B (KITS) ×2 IMPLANT
KWIRE TROCAR NS 2.5X285 (MISCELLANEOUS) IMPLANT
MANIFOLD NEPTUNE II (INSTRUMENTS) ×2 IMPLANT
NS IRRIG 1000ML POUR BTL (IV SOLUTION) ×2 IMPLANT
PACK TOTAL JOINT (CUSTOM PROCEDURE TRAY) ×2 IMPLANT
PACK UNIVERSAL I (CUSTOM PROCEDURE TRAY) ×2 IMPLANT
PAD ARMBOARD 7.5X6 YLW CONV (MISCELLANEOUS) ×4 IMPLANT
PIN APEX 6X180MM EXFIX (EXFIX) IMPLANT
PLATE SUPRAPECTINEAL L (Plate) IMPLANT
RETRIEVER SUT HEWSON (MISCELLANEOUS) IMPLANT
SCREW ASNIS 65MM 326065S (Screw) IMPLANT
SCREW CORTEX ST MATTA 3.5X22MM (Screw) IMPLANT
SCREW CORTEX ST MATTA 3.5X26MM (Screw) IMPLANT
SCREW CORTEX ST MATTA 3.5X28MM (Screw) IMPLANT
SCREW CORTEX ST MATTA 3.5X30MM (Screw) IMPLANT
SCREW CORTEX ST MATTA 3.5X32MM (Screw) IMPLANT
SCREW CORTEX ST MATTA 3.5X34MM (Screw) IMPLANT
SCREW CORTEX ST MATTA 3.5X40MM (Screw) IMPLANT
SCREW CORTEX ST MATTA 3.5X55MM (Screw) IMPLANT
SCREW CORTICAL 3.5X42MM (Screw) IMPLANT
SET HNDPC FAN SPRY TIP SCT (DISPOSABLE) ×2 IMPLANT
SPONGE T-LAP 18X18 ~~LOC~~+RFID (SPONGE) IMPLANT
STAPLER VISISTAT 35W (STAPLE) ×2 IMPLANT
SUCTION FRAZIER HANDLE 10FR (MISCELLANEOUS) ×1
SUCTION TUBE FRAZIER 10FR DISP (MISCELLANEOUS) IMPLANT
SUT FIBERWIRE #2 38 T-5 BLUE (SUTURE)
SUT MNCRL AB 3-0 PS2 18 (SUTURE) ×2 IMPLANT
SUT MON AB 2-0 CT1 36 (SUTURE) ×2 IMPLANT
SUT VIC AB 0 CT1 27 (SUTURE) ×2
SUT VIC AB 0 CT1 27XBRD ANBCTR (SUTURE) ×4 IMPLANT
SUT VIC AB 1 CT1 27 (SUTURE) ×1
SUT VIC AB 1 CT1 27XBRD ANBCTR (SUTURE) ×2 IMPLANT
SUT VIC AB 2-0 CT1 27 (SUTURE) ×2
SUT VIC AB 2-0 CT1 TAPERPNT 27 (SUTURE) ×2 IMPLANT
SUTURE FIBERWR #2 38 T-5 BLUE (SUTURE) IMPLANT
TOWEL GREEN STERILE (TOWEL DISPOSABLE) ×4 IMPLANT
TRAY FOLEY MTR SLVR 16FR STAT (SET/KITS/TRAYS/PACK) IMPLANT
WASHER SCREW MATTA SS 13.0X1.5 (Washer) IMPLANT
WATER STERILE IRR 1000ML POUR (IV SOLUTION) ×2 IMPLANT

## 2022-05-03 NOTE — Anesthesia Preprocedure Evaluation (Signed)
Anesthesia Evaluation  Patient identified by MRN, date of birth, ID band Patient confused    Reviewed: Allergy & Precautions, H&P , NPO status , Patient's Chart, lab work & pertinent test results  Airway Mallampati: II  TM Distance: >3 FB Neck ROM: Full    Dental no notable dental hx. (+) Teeth Intact, Dental Advisory Given   Pulmonary asthma    Pulmonary exam normal breath sounds clear to auscultation       Cardiovascular negative cardio ROS  Rhythm:Regular Rate:Normal     Neuro/Psych negative neurological ROS  negative psych ROS   GI/Hepatic negative GI ROS, Neg liver ROS,,,  Endo/Other    Morbid obesity  Renal/GU negative Renal ROS  negative genitourinary   Musculoskeletal   Abdominal   Peds  Hematology negative hematology ROS (+)   Anesthesia Other Findings   Reproductive/Obstetrics negative OB ROS                             Anesthesia Physical Anesthesia Plan  ASA: 2  Anesthesia Plan: General   Post-op Pain Management: Ofirmev IV (intra-op)*   Induction: Intravenous  PONV Risk Score and Plan: 4 or greater and Ondansetron, Dexamethasone and Treatment may vary due to age or medical condition  Airway Management Planned: Oral ETT  Additional Equipment:   Intra-op Plan:   Post-operative Plan: Extubation in OR  Informed Consent: I have reviewed the patients History and Physical, chart, labs and discussed the procedure including the risks, benefits and alternatives for the proposed anesthesia with the patient or authorized representative who has indicated his/her understanding and acceptance.     Dental advisory given  Plan Discussed with: CRNA  Anesthesia Plan Comments:        Anesthesia Quick Evaluation

## 2022-05-03 NOTE — ED Notes (Signed)
Pt educated on s/s of transfusion rxn. Pt stated understanding. Pt stated she would notify RN immediately if pt develops any s/s of transfusion rxn. RN at bedside. Pt in no acute distress. Vitals stable. Pt AAOx4. Pt on RA.
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
## 2022-05-03 NOTE — Anesthesia Procedure Notes (Signed)
Procedure Name: Intubation Date/Time: 05/03/2022 8:21 AM  Performed by: Carolan Clines, CRNAPre-anesthesia Checklist: Patient identified, Emergency Drugs available, Suction available and Patient being monitored Patient Re-evaluated:Patient Re-evaluated prior to induction Oxygen Delivery Method: Circle System Utilized Preoxygenation: Pre-oxygenation with 100% oxygen Induction Type: IV induction Ventilation: Mask ventilation without difficulty Laryngoscope Size: Mac and 4 Grade View: Grade I Tube type: Oral Tube size: 7.0 mm Number of attempts: 1 Airway Equipment and Method: Stylet Placement Confirmation: ETT inserted through vocal cords under direct vision, positive ETCO2 and breath sounds checked- equal and bilateral Secured at: 22 cm Tube secured with: Tape Dental Injury: Teeth and Oropharynx as per pre-operative assessment
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
## 2022-05-03 NOTE — ED Notes (Signed)
Blood consent obtained

## 2022-05-03 NOTE — Progress Notes (Signed)
RN consulted Pharmacist Darl Pikes, about Medication Tranexamic Acid, for instructions on medication administration.
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
## 2022-05-03 NOTE — ED Notes (Signed)
No s/s of transfusion rxn noted. Pt re educated on s/s of rxn. Pt stated understanding. Pt stated she would notify RN immediately if pt develops any s/s of rxn. Pt stable. Pt's vitals stable. Pt is AAOx4. Pt on RA. Pt's callbell within reach.

## 2022-05-03 NOTE — Transfer of Care (Signed)
Immediate Anesthesia Transfer of Care Note  Patient: Karen Curry  Procedure(s) Performed: OPEN REDUCTION INTERNAL FIXATION ACETABULAR FRACTURE STOPPA (Left)  Patient Location: PACU  Anesthesia Type:General  Level of Consciousness: drowsy  Airway & Oxygen Therapy: Patient Spontanous Breathing and Patient connected to face mask oxygen  Post-op Assessment: Report given to RN and Post -op Vital signs reviewed and stable  Post vital signs: Reviewed and stable  Last Vitals:  Vitals Value Taken Time  BP 128/85 05/03/22 1245  Temp    Pulse 81 05/03/22 1248  Resp 17 05/03/22 1248  SpO2 100 % 05/03/22 1248  Vitals shown include unvalidated device data.  Last Pain:  Vitals:   05/03/22 0540  TempSrc: Oral  PainSc:          Complications: No notable events documented.

## 2022-05-04 LAB — TYPE AND SCREEN
ABO/RH(D): AB POS
Antibody Screen: NEGATIVE
Unit division: 0
Unit division: 0
Unit division: 0

## 2022-05-04 LAB — CBC
HCT: 25.7 % — ABNORMAL LOW (ref 36.0–46.0)
Hemoglobin: 8.6 g/dL — ABNORMAL LOW (ref 12.0–15.0)
MCH: 27.4 pg (ref 26.0–34.0)
MCHC: 33.5 g/dL (ref 30.0–36.0)
MCV: 81.8 fL (ref 80.0–100.0)
Platelets: 157 10*3/uL (ref 150–400)
RBC: 3.14 MIL/uL — ABNORMAL LOW (ref 3.87–5.11)
RDW: 17.7 % — ABNORMAL HIGH (ref 11.5–15.5)
WBC: 12.1 10*3/uL — ABNORMAL HIGH (ref 4.0–10.5)
nRBC: 0 % (ref 0.0–0.2)

## 2022-05-04 LAB — BASIC METABOLIC PANEL
Anion gap: 5 (ref 5–15)
BUN: <5 mg/dL — ABNORMAL LOW (ref 6–20)
CO2: 27 mmol/L (ref 22–32)
Calcium: 7.9 mg/dL — ABNORMAL LOW (ref 8.9–10.3)
Chloride: 108 mmol/L (ref 98–111)
Creatinine, Ser: 0.56 mg/dL (ref 0.44–1.00)
GFR, Estimated: >60 mL/min (ref >60)
Glucose, Bld: 103 mg/dL — ABNORMAL HIGH (ref 70–99)
Potassium: 3.7 mmol/L (ref 3.5–5.1)
Sodium: 140 mmol/L (ref 135–145)

## 2022-05-04 LAB — BPAM RBC
Blood Product Expiration Date: 202312162359
Blood Product Expiration Date: 202312162359
Blood Product Expiration Date: 202312162359
ISSUE DATE / TIME: 202312040518
ISSUE DATE / TIME: 202312040839
ISSUE DATE / TIME: 202312040839
Unit Type and Rh: 6200
Unit Type and Rh: 6200
Unit Type and Rh: 6200

## 2022-05-04 LAB — VITAMIN D 25 HYDROXY (VIT D DEFICIENCY, FRACTURES): Vit D, 25-Hydroxy: 11.47 ng/mL — ABNORMAL LOW (ref 30–100)

## 2022-05-04 MED ORDER — HYDROMORPHONE HCL 1 MG/ML IJ SOLN: 0.5000 mg | INTRAMUSCULAR | Status: DC | PRN

## 2022-05-04 MED ORDER — ENOXAPARIN SODIUM 30 MG/0.3ML IJ SOSY
30.0000 mg | PREFILLED_SYRINGE | Freq: Two times a day (BID) | INTRAMUSCULAR | Status: DC
  Administered 2022-05-04 – 2022-05-10 (×13): 30 mg via SUBCUTANEOUS
  Filled 2022-05-04 (×14): qty 0.3

## 2022-05-04 MED ORDER — DIPHENHYDRAMINE HCL 12.5 MG/5ML PO ELIX
12.5000 mg | ORAL_SOLUTION | Freq: Four times a day (QID) | ORAL | Status: DC | PRN
  Administered 2022-05-04 – 2022-05-05 (×2): 12.5 mg via ORAL
  Filled 2022-05-04 (×2): qty 5

## 2022-05-04 MED ORDER — MAGNESIUM HYDROXIDE 400 MG/5ML PO SUSP
30.0000 mL | Freq: Once | ORAL | Status: AC
  Administered 2022-05-04: 30 mL via ORAL
  Filled 2022-05-04: qty 30

## 2022-05-04 MED ORDER — POLYETHYLENE GLYCOL 3350 17 G PO PACK
17.0000 g | PACK | Freq: Every day | ORAL | Status: DC
  Administered 2022-05-04 – 2022-05-10 (×7): 17 g via ORAL
  Filled 2022-05-04 (×7): qty 1

## 2022-05-04 MED ORDER — MAGNESIUM CITRATE PO SOLN
1.0000 | Freq: Once | ORAL | Status: AC
  Administered 2022-05-04: 1 via ORAL
  Filled 2022-05-04: qty 296

## 2022-05-04 MED ORDER — METHOCARBAMOL 500 MG PO TABS
1000.0000 mg | ORAL_TABLET | Freq: Three times a day (TID) | ORAL | Status: DC
  Administered 2022-05-04 – 2022-05-06 (×6): 1000 mg via ORAL
  Filled 2022-05-04 (×6): qty 2

## 2022-05-04 MED ORDER — BISACODYL 10 MG RE SUPP
10.0000 mg | Freq: Once | RECTAL | Status: DC
  Filled 2022-05-04: qty 1

## 2022-05-04 MED ORDER — BISACODYL 5 MG PO TBEC
10.0000 mg | DELAYED_RELEASE_TABLET | Freq: Once | ORAL | Status: AC
  Administered 2022-05-04: 10 mg via ORAL
  Filled 2022-05-04: qty 2

## 2022-05-04 MED ORDER — VITAMIN D (ERGOCALCIFEROL) 1.25 MG (50000 UNIT) PO CAPS
50000.0000 [IU] | ORAL_CAPSULE | ORAL | Status: DC
  Administered 2022-05-04: 50000 [IU] via ORAL
  Filled 2022-05-04 (×2): qty 1

## 2022-05-04 MED ORDER — KETOROLAC TROMETHAMINE 15 MG/ML IJ SOLN
30.0000 mg | Freq: Four times a day (QID) | INTRAMUSCULAR | Status: AC
  Administered 2022-05-04 – 2022-05-09 (×19): 30 mg via INTRAVENOUS
  Filled 2022-05-04 (×19): qty 2

## 2022-05-04 MED ORDER — SENNA 8.6 MG PO TABS
2.0000 | ORAL_TABLET | Freq: Once | ORAL | Status: AC
  Administered 2022-05-04: 17.2 mg via ORAL
  Filled 2022-05-04: qty 2

## 2022-05-04 NOTE — Anesthesia Postprocedure Evaluation (Signed)
Anesthesia Post Note  Patient: Karen Curry  Procedure(s) Performed: OPEN REDUCTION INTERNAL FIXATION ACETABULAR FRACTURE STOPPA (Left)     Patient location during evaluation: PACU Anesthesia Type: General Level of consciousness: awake and alert Pain management: pain level controlled Vital Signs Assessment: post-procedure vital signs reviewed and stable Respiratory status: spontaneous breathing, nonlabored ventilation, respiratory function stable and patient connected to nasal cannula oxygen Cardiovascular status: blood pressure returned to baseline and stable Postop Assessment: no apparent nausea or vomiting Anesthetic complications: no   No notable events documented.  Last Vitals:  Vitals:   05/04/22 0256 05/04/22 0328  BP: 116/79   Pulse: 100   Resp: 15 (!) 21  Temp: 36.9 C   SpO2: 100%     Last Pain:  Vitals:   05/04/22 0521  TempSrc:   PainSc: 7                  Charbel Los S
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
## 2022-05-04 NOTE — Progress Notes (Signed)
Orthopaedic Trauma Progress Note  SUBJECTIVE: Doing ok this AM. Pain in the left hip, but this is overall controlled at rest. Has not been out of bed yet since surgery.  No chest pain. No SOB. No nausea/vomiting. No numbness/tingling throughout extremities. No other complaints. Mom at bedside  OBJECTIVE:  Vitals:   05/04/22 0256 05/04/22 0328  BP: 116/79   Pulse: 100   Resp: 15 (!) 21  Temp: 98.4 F (36.9 C)   SpO2: 100%     General: Sitting up in bed, NAD Respiratory: No increased work of breathing.  LLE: Dressing CDI. Tender over the left hip. Ankle DF/PF intcat. Able to flex and extend the knee without pain. +EHL/FHL. Sensation intact throughout. Compartments soft and compressible. 2+ DP pulse  IMAGING: Stable post op imaging.   LABS:  Results for orders placed or performed during the hospital encounter of 05/01/22 (from the past 24 hour(s))  POCT I-Stat EG7     Status: Abnormal   Collection Time: 05/03/22 11:25 AM  Result Value Ref Range   pH, Ven 7.393 7.25 - 7.43   pCO2, Ven 42.1 (L) 44 - 60 mmHg   pO2, Ven 88 (H) 32 - 45 mmHg   Bicarbonate 25.6 20.0 - 28.0 mmol/L   TCO2 27 22 - 32 mmol/L   O2 Saturation 97 %   Acid-Base Excess 1.0 0.0 - 2.0 mmol/L   Sodium 137 135 - 145 mmol/L   Potassium 4.7 3.5 - 5.1 mmol/L   Calcium, Ion 1.12 (L) 1.15 - 1.40 mmol/L   HCT 28.0 (L) 36.0 - 46.0 %   Hemoglobin 9.5 (L) 12.0 - 15.0 g/dL   Sample type VENOUS   CBC     Status: Abnormal   Collection Time: 05/04/22  4:48 AM  Result Value Ref Range   WBC 12.1 (H) 4.0 - 10.5 K/uL   RBC 3.14 (L) 3.87 - 5.11 MIL/uL   Hemoglobin 8.6 (L) 12.0 - 15.0 g/dL   HCT 69.7 (L) 94.8 - 01.6 %   MCV 81.8 80.0 - 100.0 fL   MCH 27.4 26.0 - 34.0 pg   MCHC 33.5 30.0 - 36.0 g/dL   RDW 55.3 (H) 74.8 - 27.0 %   Platelets 157 150 - 400 K/uL   nRBC 0.0 0.0 - 0.2 %  Basic metabolic panel     Status: Abnormal   Collection Time: 05/04/22  4:48 AM  Result Value Ref Range   Sodium 140 135 - 145 mmol/L    Potassium 3.7 3.5 - 5.1 mmol/L   Chloride 108 98 - 111 mmol/L   CO2 27 22 - 32 mmol/L   Glucose, Bld 103 (H) 70 - 99 mg/dL   BUN <5 (L) 6 - 20 mg/dL   Creatinine, Ser 7.86 0.44 - 1.00 mg/dL   Calcium 7.9 (L) 8.9 - 10.3 mg/dL   GFR, Estimated >75 >44 mL/min   Anion gap 5 5 - 15  VITAMIN D 25 Hydroxy (Vit-D Deficiency, Fractures)     Status: Abnormal   Collection Time: 05/04/22  4:48 AM  Result Value Ref Range   Vit D, 25-Hydroxy 11.47 (L) 30 - 100 ng/mL    ASSESSMENT: Karen Curry is a 26 y.o. female, 1 Day Post-Op s/p OPEN REDUCTION INTERNAL FIXATION LEFT ACETABULAR FRACTURE STOPPA  CV/Blood loss: Acute blood loss anemia, Hgb 8.6 this AM . Hemodynamically stable  PLAN: Weightbearing: TDWB LLE ROM: ok for unrestricted hip ROM as tolerated  Incisional and dressing care: Reinforce dressings as needed  Showering: Ok to begin getting incisions wet 05/06/22 if no drainage Orthopedic device(s): None  Pain management:  1. Tylenol 650 mg q 6 hours scheduled 2. Robaxin 500 mg q 6 hours PRN 3. Oxycodone 5-10 mg q 4 hours PRN 4. Dilaudid 1 mg q 3 hours PRN VTE prophylaxis: Ok to start Lovenox from ortho standpoint, SCDs ID:  Ancef 2gm post op Foley/Lines: Foley in place. Continue per primary team. KVO IVFs Impediments to Fracture Healing: Vit D level 11, start on supplementation Dispo: PT/OT eval today. Plan to remove dressings tomorrow 04/05/22  D/C recommendations: - Oxycodone and Robaxin for pain control - Eliquis 2.5 mg BID x 30 days for DVT prophylaxis - Continue 50,000 units q 7 days Vit D supplementation  Follow - up plan: 2 weeks after d/c   Contact information:  Truitt Merle MD, Thyra Breed PA-C. After hours and holidays please check [email or web address] for group call information for [facility]   Thompson Caul, PA-C [phone] (office) [email or web address]
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
## 2022-05-05 LAB — CBC
HCT: 23.7 % — ABNORMAL LOW (ref 36.0–46.0)
Hemoglobin: 8 g/dL — ABNORMAL LOW (ref 12.0–15.0)
MCH: 27.6 pg (ref 26.0–34.0)
MCHC: 33.8 g/dL (ref 30.0–36.0)
MCV: 81.7 fL (ref 80.0–100.0)
Platelets: 150 10*3/uL (ref 150–400)
RBC: 2.9 MIL/uL — ABNORMAL LOW (ref 3.87–5.11)
RDW: 18.6 % — ABNORMAL HIGH (ref 11.5–15.5)
WBC: 9.9 10*3/uL (ref 4.0–10.5)
nRBC: 0.3 % — ABNORMAL HIGH (ref 0.0–0.2)
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
## 2022-05-05 NOTE — Progress Notes (Signed)
Patient's mother provided with work excuse note, per her request.  Quintella Baton, RN, BSN  Trauma/Neuro ICU Case Manager [phone]
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
## 2022-05-06 ENCOUNTER — Other Ambulatory Visit: Payer: Self-pay

## 2022-05-06 ENCOUNTER — Encounter (HOSPITAL_COMMUNITY): Payer: Self-pay

## 2022-05-06 NOTE — TOC CAGE-AID Note (Signed)
Transition of Care [facility]) - CAGE-AID Screening   Patient Details  Name: Karen Curry MRN: [id number] Date of Birth: [birth year]  Transition of [facility]) CM/SW Contact:    Katha Hamming, RN Phone Number:[phone] 05/06/2022, 9:58 PM   Clinical Narrative: Reports no alcohol use, some marijuana use. No resources indicated.   CAGE-AID Screening:    Have You Ever Felt You Ought to Cut Down on Your Drinking or Drug Use?: No Have People Annoyed You By Critizing Your Drinking Or Drug Use?: No Have You Felt Bad Or Guilty About Your Drinking Or Drug Use?: No Have You Ever Had a Drink or Used Drugs First Thing In The Morning to Steady Your Nerves or to Get Rid of a Hangover?: No CAGE-AID Score: 0  Substance Abuse Education Offered: No
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
## 2022-05-06 NOTE — Progress Notes (Signed)
  Inpatient Rehabilitation Admissions Coordinator   Met with patient, Mom  and friend at bedside for rehab assessment. We discussed goals and expectations of a possible CIR admit. They prefer CIR for rehab. Family can provide expected caregiver support that is recommended. I will begin insurance Auth with [employer]  for possible CIR admit pending approval once I have updated therapy notes today. Please call me with any questions.   Danne Baxter, RN, MSN Rehab Admissions Coordinator [phone]
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
## 2022-05-07 MED ORDER — MAGNESIUM CITRATE PO SOLN
1.0000 | Freq: Once | ORAL | Status: AC
  Administered 2022-05-07: 1 via ORAL
  Filled 2022-05-07: qty 296

## 2022-05-07 MED ORDER — DIPHENHYDRAMINE-ZINC ACETATE 2-0.1 % EX CREA
TOPICAL_CREAM | Freq: Three times a day (TID) | CUTANEOUS | Status: DC | PRN
  Filled 2022-05-07: qty 28

## 2022-05-07 MED ORDER — OXYCODONE HCL 5 MG PO TABS
5.0000 mg | ORAL_TABLET | ORAL | Status: DC | PRN
  Administered 2022-05-07 – 2022-05-09 (×5): 5 mg via ORAL
  Administered 2022-05-09: 10 mg via ORAL
  Administered 2022-05-09: 5 mg via ORAL
  Administered 2022-05-09 – 2022-05-10 (×3): 10 mg via ORAL
  Filled 2022-05-07: qty 1
  Filled 2022-05-07 (×2): qty 2
  Filled 2022-05-07: qty 1
  Filled 2022-05-07 (×2): qty 2
  Filled 2022-05-07 (×5): qty 1

## 2022-05-07 MED ORDER — OXYCODONE-ACETAMINOPHEN 5-325 MG PO TABS: 1.0000 | ORAL_TABLET | Freq: Four times a day (QID) | ORAL | 0 refills | Status: DC | PRN

## 2022-05-07 MED ORDER — BISACODYL 10 MG RE SUPP: 10.0000 mg | Freq: Every day | RECTAL | Status: DC | PRN
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
## 2022-05-07 NOTE — Progress Notes (Signed)
Patient called out for assistance.The writer went to the room to find out what pt needed. Pt request for the hospital phone which the was given to her as requested. 20 minutes later, pt called out again requesting the writer to go to her mother and collect her mother's cell phone the patient's mother stated that she did not want her to have her phone. She wanted her to go to sleep.30 minutes later, pt called out again wanting  the writer to get her mother's phone again and the charger.Pt was informed that that phone is personal item, not an emergency, and since her mother does not want her to have the phone, it will be right for me to get the phone and handover to her simply because her mother is sleeping. This response did not sit well with patient.

## 2022-05-07 NOTE — Progress Notes (Signed)
Inpatient Rehabilitation Admissions Coordinator   I await insurance approval for possible CIR admit.  Ottie Glazier, RN, MSN Rehab Admissions Coordinator [phone] 05/07/2022 12:57 PM
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
## 2022-05-07 NOTE — Progress Notes (Signed)
  Inpatient Rehabilitation Admissions Coordinator   Met with patient and girlfriend, Darrick Penna, at bedside for rehab assessment. We discussed goals and expectations of a possible CIR admit. Currently listed as uninsured but he states he signed up for a policy last week, but does not have further information. First source, [place], notes that he may have a Chief of Staff. We will investigate if he has a Engineer, agricultural. Shelly and patient to discuss further caregiver supports she or family may be able to provide. We discussed estimated cost of care as he is currently uninsured. He is asking if going home with [facility] or OP is an option. I stated that we would have to see how he progresses with therapy over the weekend, as well as how much support Shelly could provide. I will follow up on Monday. Please call me with any questions.   Danne Baxter, RN, MSN Rehab Admissions Coordinator [phone]
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
## 2022-05-08 NOTE — Progress Notes (Signed)
Mobility Specialist Progress Note   05/08/22 1200  Mobility  Activity Stood at bedside;Transferred from bed to chair  Level of Assistance Minimal assist, patient does 75% or more  Assistive Device Front wheel walker  Distance Ambulated (ft) 2 ft  LLE Weight Bearing TWB  Activity Response Tolerated well  $Mobility charge 1 Mobility   Pre Mobility: 89 HR, 100% SpO2 on RA  During Mobility: 116 HR Post Mobility: 82 HR, 129/81 BP, 98% SpO2 on RA  Received in bed c/o pain in L hip but RN shortly came w/ pain meds. Stand by assist to EOB but MinA to stand d/t poor adherence to WB precautions. Requiring tactile cues w/ foot under LLE to retain importance of WB status, stand, pivot, turn to chair w/o fault. Once seated, Able to perform x10 STS to reiterate technique and good body mechanics. Left in chair w/ call bell in reach and needs met.   Holland Falling Mobility Specialist Please contact via SecureChat or  Rehab office at [phone]
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
## 2022-05-09 LAB — CBC
HCT: 26.5 % — ABNORMAL LOW (ref 36.0–46.0)
Hemoglobin: 8.5 g/dL — ABNORMAL LOW (ref 12.0–15.0)
MCH: 27.4 pg (ref 26.0–34.0)
MCHC: 32.1 g/dL (ref 30.0–36.0)
MCV: 85.5 fL (ref 80.0–100.0)
Platelets: 281 10*3/uL (ref 150–400)
RBC: 3.1 MIL/uL — ABNORMAL LOW (ref 3.87–5.11)
RDW: 19.9 % — ABNORMAL HIGH (ref 11.5–15.5)
WBC: 8.5 10*3/uL (ref 4.0–10.5)
nRBC: 0.7 % — ABNORMAL HIGH (ref 0.0–0.2)
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
## 2022-05-10 ENCOUNTER — Inpatient Hospital Stay (HOSPITAL_COMMUNITY)
Admission: RE | Admit: 2022-05-10 | Discharge: 2022-05-18 | DRG: 945 | Disposition: A | Discharge status: Home / Self Care | Payer: Medicaid Other | Source: Intra-hospital | Attending: Physical Medicine & Rehabilitation | Admitting: Physical Medicine & Rehabilitation

## 2022-05-10 ENCOUNTER — Encounter (HOSPITAL_COMMUNITY): Payer: Self-pay | Admitting: Obstetrics and Gynecology

## 2022-05-10 ENCOUNTER — Other Ambulatory Visit: Payer: Self-pay

## 2022-05-10 DIAGNOSIS — Z87442 Personal history of urinary calculi: Secondary | ICD-10-CM

## 2022-05-10 DIAGNOSIS — S069XAS Unspecified intracranial injury with loss of consciousness status unknown, sequela: Secondary | ICD-10-CM

## 2022-05-10 DIAGNOSIS — S32402D Unspecified fracture of left acetabulum, subsequent encounter for fracture with routine healing: Secondary | ICD-10-CM

## 2022-05-10 DIAGNOSIS — S3282XA Multiple fractures of pelvis without disruption of pelvic ring, initial encounter for closed fracture: Secondary | ICD-10-CM | POA: Diagnosis not present

## 2022-05-10 DIAGNOSIS — F39 Unspecified mood [affective] disorder: Secondary | ICD-10-CM | POA: Diagnosis present

## 2022-05-10 DIAGNOSIS — Z87440 Personal history of urinary (tract) infections: Secondary | ICD-10-CM

## 2022-05-10 DIAGNOSIS — K029 Dental caries, unspecified: Secondary | ICD-10-CM | POA: Diagnosis not present

## 2022-05-10 DIAGNOSIS — S069XAA Unspecified intracranial injury with loss of consciousness status unknown, initial encounter: Secondary | ICD-10-CM | POA: Diagnosis not present

## 2022-05-10 DIAGNOSIS — S069X2S Unspecified intracranial injury with loss of consciousness of 31 minutes to 59 minutes, sequela: Secondary | ICD-10-CM | POA: Diagnosis not present

## 2022-05-10 DIAGNOSIS — I69198 Other sequelae of nontraumatic intracerebral hemorrhage: Secondary | ICD-10-CM | POA: Diagnosis not present

## 2022-05-10 DIAGNOSIS — F1290 Cannabis use, unspecified, uncomplicated: Secondary | ICD-10-CM | POA: Diagnosis present

## 2022-05-10 DIAGNOSIS — F0630 Mood disorder due to known physiological condition, unspecified: Secondary | ICD-10-CM

## 2022-05-10 DIAGNOSIS — S0101XD Laceration without foreign body of scalp, subsequent encounter: Secondary | ICD-10-CM | POA: Diagnosis not present

## 2022-05-10 DIAGNOSIS — S069XAD Unspecified intracranial injury with loss of consciousness status unknown, subsequent encounter: Secondary | ICD-10-CM | POA: Diagnosis present

## 2022-05-10 DIAGNOSIS — E669 Obesity, unspecified: Secondary | ICD-10-CM | POA: Diagnosis present

## 2022-05-10 DIAGNOSIS — M25552 Pain in left hip: Secondary | ICD-10-CM | POA: Diagnosis present

## 2022-05-10 DIAGNOSIS — S069X0A Unspecified intracranial injury without loss of consciousness, initial encounter: Secondary | ICD-10-CM

## 2022-05-10 DIAGNOSIS — S32592D Other specified fracture of left pubis, subsequent encounter for fracture with routine healing: Secondary | ICD-10-CM

## 2022-05-10 DIAGNOSIS — N9489 Other specified conditions associated with female genital organs and menstrual cycle: Secondary | ICD-10-CM | POA: Diagnosis present

## 2022-05-10 DIAGNOSIS — I82452 Acute embolism and thrombosis of left peroneal vein: Secondary | ICD-10-CM | POA: Diagnosis not present

## 2022-05-10 DIAGNOSIS — L680 Hirsutism: Secondary | ICD-10-CM | POA: Diagnosis present

## 2022-05-10 DIAGNOSIS — Z6838 Body mass index (BMI) 38.0-38.9, adult: Secondary | ICD-10-CM | POA: Diagnosis not present

## 2022-05-10 DIAGNOSIS — K0889 Other specified disorders of teeth and supporting structures: Secondary | ICD-10-CM | POA: Diagnosis not present

## 2022-05-10 DIAGNOSIS — D62 Acute posthemorrhagic anemia: Secondary | ICD-10-CM | POA: Diagnosis present

## 2022-05-10 DIAGNOSIS — S069X2A Unspecified intracranial injury with loss of consciousness of 31 minutes to 59 minutes, initial encounter: Secondary | ICD-10-CM | POA: Diagnosis not present

## 2022-05-10 DIAGNOSIS — Z8249 Family history of ischemic heart disease and other diseases of the circulatory system: Secondary | ICD-10-CM | POA: Diagnosis not present

## 2022-05-10 DIAGNOSIS — S3282XS Multiple fractures of pelvis without disruption of pelvic ring, sequela: Secondary | ICD-10-CM | POA: Diagnosis not present

## 2022-05-10 DIAGNOSIS — R609 Edema, unspecified: Secondary | ICD-10-CM | POA: Diagnosis not present

## 2022-05-10 DIAGNOSIS — S36113D Laceration of liver, unspecified degree, subsequent encounter: Secondary | ICD-10-CM

## 2022-05-10 DIAGNOSIS — S069X3S Unspecified intracranial injury with loss of consciousness of 1 hour to 5 hours 59 minutes, sequela: Secondary | ICD-10-CM | POA: Diagnosis not present

## 2022-05-10 DIAGNOSIS — F4389 Other reactions to severe stress: Secondary | ICD-10-CM | POA: Diagnosis not present

## 2022-05-10 DIAGNOSIS — S069X0S Unspecified intracranial injury without loss of consciousness, sequela: Secondary | ICD-10-CM | POA: Diagnosis not present

## 2022-05-10 DIAGNOSIS — S27322D Contusion of lung, bilateral, subsequent encounter: Secondary | ICD-10-CM

## 2022-05-10 DIAGNOSIS — J45909 Unspecified asthma, uncomplicated: Secondary | ICD-10-CM | POA: Diagnosis present

## 2022-05-10 LAB — CBC
HCT: 29.4 % — ABNORMAL LOW (ref 36.0–46.0)
Hemoglobin: 9.2 g/dL — ABNORMAL LOW (ref 12.0–15.0)
MCH: 27.3 pg (ref 26.0–34.0)
MCHC: 31.3 g/dL (ref 30.0–36.0)
MCV: 87.2 fL (ref 80.0–100.0)
Platelets: 377 10*3/uL (ref 150–400)
RBC: 3.37 MIL/uL — ABNORMAL LOW (ref 3.87–5.11)
RDW: 20.5 % — ABNORMAL HIGH (ref 11.5–15.5)
WBC: 8.9 10*3/uL (ref 4.0–10.5)
nRBC: 0.3 % — ABNORMAL HIGH (ref 0.0–0.2)

## 2022-05-10 LAB — CREATININE, SERUM
Creatinine, Ser: 0.64 mg/dL (ref 0.44–1.00)
GFR, Estimated: >60 mL/min (ref >60)

## 2022-05-10 MED ORDER — VITAMIN D (ERGOCALCIFEROL) 1.25 MG (50000 UNIT) PO CAPS
50000.0000 [IU] | ORAL_CAPSULE | ORAL | Status: DC
  Administered 2022-05-11 – 2022-05-18 (×2): 50000 [IU] via ORAL
  Filled 2022-05-10 (×2): qty 1

## 2022-05-10 MED ORDER — OXYCODONE HCL 5 MG PO TABS
5.0000 mg | ORAL_TABLET | ORAL | Status: DC | PRN
  Administered 2022-05-10 – 2022-05-18 (×32): 10 mg via ORAL
  Filled 2022-05-10 (×32): qty 2

## 2022-05-10 MED ORDER — ONDANSETRON HCL 4 MG/2ML IJ SOLN: 4.0000 mg | Freq: Four times a day (QID) | INTRAMUSCULAR | Status: DC | PRN

## 2022-05-10 MED ORDER — ENOXAPARIN SODIUM 30 MG/0.3ML IJ SOSY
30.0000 mg | PREFILLED_SYRINGE | Freq: Two times a day (BID) | INTRAMUSCULAR | Status: DC
  Administered 2022-05-10 – 2022-05-12 (×4): 30 mg via SUBCUTANEOUS
  Filled 2022-05-10 (×5): qty 0.3

## 2022-05-10 MED ORDER — ACETAMINOPHEN 325 MG PO TABS
325.0000 mg | ORAL_TABLET | ORAL | Status: DC | PRN
  Administered 2022-05-15 – 2022-05-17 (×2): 650 mg via ORAL
  Filled 2022-05-10 (×3): qty 2

## 2022-05-10 MED ORDER — DOCUSATE SODIUM 100 MG PO CAPS
100.0000 mg | ORAL_CAPSULE | Freq: Two times a day (BID) | ORAL | Status: DC
  Administered 2022-05-11 – 2022-05-18 (×14): 100 mg via ORAL
  Filled 2022-05-10 (×16): qty 1

## 2022-05-10 MED ORDER — ENOXAPARIN SODIUM 30 MG/0.3ML IJ SOSY: 30.0000 mg | PREFILLED_SYRINGE | Freq: Two times a day (BID) | INTRAMUSCULAR | Status: DC

## 2022-05-10 MED ORDER — POLYETHYLENE GLYCOL 3350 17 G PO PACK
17.0000 g | PACK | Freq: Every day | ORAL | Status: DC
  Administered 2022-05-15 – 2022-05-18 (×4): 17 g via ORAL
  Filled 2022-05-10 (×9): qty 1

## 2022-05-10 MED ORDER — ONDANSETRON 4 MG PO TBDP: 4.0000 mg | ORAL_TABLET | Freq: Four times a day (QID) | ORAL | Status: DC | PRN

## 2022-05-10 NOTE — Progress Notes (Signed)
Inpatient Rehabilitation Admissions Coordinator   I continue to await insurance approval for possible CIR admit.I contacted [employer] again today to request expedited approval.  Ottie Glazier, RN, MSN Rehab Admissions Coordinator [phone] 05/10/2022 10:51 AM
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
## 2022-05-10 NOTE — Progress Notes (Signed)
Inpatient Rehabilitation Admissions Coordinator   I have insurance approval and can admit to CIR today. I met with patient at bedside and spoke with her Mom by phone. Acute team and TOC made aware. I will make the arrangements.  Barbara Boyette, RN, MSN Rehab Admissions Coordinator [phone] 05/10/2022 1:31 PM  
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
## 2022-05-10 NOTE — Progress Notes (Signed)
Inpatient Rehabilitation Admission Medication Review by a Pharmacist  A complete drug regimen review was completed for this patient to identify any potential clinically significant medication issues.  High Risk Drug Classes Is patient taking? Indication by Medication  Antipsychotic No   Anticoagulant Yes Lovenox - DVT px  Antibiotic No   Opioid Yes Oxycodone - pain  Antiplatelet No   Hypoglycemics/insulin No   Vasoactive Medication No   Chemotherapy No   Other Yes Vitamin D - supplement     Type of Medication Issue Identified Description of Issue Recommendation(s)  Drug Interaction(s) (clinically significant)     Duplicate Therapy     Allergy     No Medication Administration End Date     Incorrect Dose     Additional Drug Therapy Needed     Significant med changes from prior encounter (inform family/care partners about these prior to discharge).    Other       Clinically significant medication issues were identified that warrant physician communication and completion of prescribed/recommended actions by midnight of the next day:  No  Name of provider notified for urgent issues identified:   Provider Method of Notification:     Pharmacist comments:   Time spent performing this drug regimen review (minutes):  20   Ulyses Southward, PharmD, [place], AAHIVP, CPP Infectious Disease Pharmacist 05/10/2022 6:13 PM

## 2022-05-11 ENCOUNTER — Encounter (HOSPITAL_COMMUNITY): Payer: Self-pay | Admitting: Physical Medicine & Rehabilitation

## 2022-05-11 ENCOUNTER — Encounter (HOSPITAL_COMMUNITY): Payer: No Typology Code available for payment source

## 2022-05-11 DIAGNOSIS — S3282XS Multiple fractures of pelvis without disruption of pelvic ring, sequela: Secondary | ICD-10-CM | POA: Diagnosis not present

## 2022-05-11 DIAGNOSIS — D62 Acute posthemorrhagic anemia: Secondary | ICD-10-CM | POA: Diagnosis not present

## 2022-05-11 DIAGNOSIS — S069X2S Unspecified intracranial injury with loss of consciousness of 31 minutes to 59 minutes, sequela: Secondary | ICD-10-CM | POA: Diagnosis not present

## 2022-05-11 LAB — CBC WITH DIFFERENTIAL/PLATELET
Abs Immature Granulocytes: 0.27 10*3/uL — ABNORMAL HIGH (ref 0.00–0.07)
Basophils Absolute: 0.1 10*3/uL (ref 0.0–0.1)
Basophils Relative: 1 %
Eosinophils Absolute: 0.2 10*3/uL (ref 0.0–0.5)
Eosinophils Relative: 3 %
HCT: 28.7 % — ABNORMAL LOW (ref 36.0–46.0)
Hemoglobin: 8.8 g/dL — ABNORMAL LOW (ref 12.0–15.0)
Immature Granulocytes: 3 %
Lymphocytes Relative: 21 %
Lymphs Abs: 1.9 10*3/uL (ref 0.7–4.0)
MCH: 27.2 pg (ref 26.0–34.0)
MCHC: 30.7 g/dL (ref 30.0–36.0)
MCV: 88.6 fL (ref 80.0–100.0)
Monocytes Absolute: 1 10*3/uL (ref 0.1–1.0)
Monocytes Relative: 11 %
Neutro Abs: 5.7 10*3/uL (ref 1.7–7.7)
Neutrophils Relative %: 61 %
Platelets: 367 10*3/uL (ref 150–400)
RBC: 3.24 MIL/uL — ABNORMAL LOW (ref 3.87–5.11)
RDW: 20.8 % — ABNORMAL HIGH (ref 11.5–15.5)
WBC: 9.1 10*3/uL (ref 4.0–10.5)
nRBC: 0.7 % — ABNORMAL HIGH (ref 0.0–0.2)

## 2022-05-11 LAB — COMPREHENSIVE METABOLIC PANEL
ALT: 42 U/L (ref 0–44)
AST: 37 U/L (ref 15–41)
Albumin: 2.5 g/dL — ABNORMAL LOW (ref 3.5–5.0)
Alkaline Phosphatase: 53 U/L (ref 38–126)
Anion gap: 8 (ref 5–15)
BUN: 7 mg/dL (ref 6–20)
CO2: 29 mmol/L (ref 22–32)
Calcium: 8.8 mg/dL — ABNORMAL LOW (ref 8.9–10.3)
Chloride: 102 mmol/L (ref 98–111)
Creatinine, Ser: 0.68 mg/dL (ref 0.44–1.00)
GFR, Estimated: >60 mL/min (ref >60)
Glucose, Bld: 98 mg/dL (ref 70–99)
Potassium: 4.4 mmol/L (ref 3.5–5.1)
Sodium: 139 mmol/L (ref 135–145)
Total Bilirubin: 0.7 mg/dL (ref 0.3–1.2)
Total Protein: 6.7 g/dL (ref 6.5–8.1)

## 2022-05-11 NOTE — Discharge Instructions (Addendum)
Inpatient Rehab Discharge Instructions  Karen Curry Discharge date and time: No discharge date for patient encounter.   Activities/Precautions/ Functional Status: Activity: Touchdown weightbearing left lower extremity Diet: regular diet Wound Care: Routine skin checks Functional status:  ___ No restrictions     ___ Walk up steps independently ___ 24/7 supervision/assistance   ___ Walk up steps with assistance ___ Intermittent supervision/assistance  ___ Bathe/dress independently ___ Walk with walker     _x__ Bathe/dress with assistance ___ Walk Independently    ___ Shower independently ___ Walk with assistance    ___ Shower with assistance ___ No alcohol     ___ Return to work/school ________  Special Instructions: No driving smoking or alcohol   COMMUNITY REFERRALS UPON DISCHARGE:    Outpatient: PT        ST                 Agency: [facility]    Phone: [phone]             Appointment Date/Time: *please expect follow-up within 7-10 business days to schedule your home visit. If you have not received follow-up, be sure to contact the branch directly.*  Medical Equipment/Items Ordered: rolling walker, 3in1 bedside commode, and tub transfer bench                                                 Agency/Supplier: [employer] [phone]  GENERAL COMMUNITY RESOURCES FOR PATIENT/FAMILY: If you require transportation to medical appointments, please be sure to contact [employer] [phone] or [phone]. Be sure to contact within 3-5 business days to ensure you have transportation to your appointment.*    My questions have been answered and I understand these instructions. I will adhere to these goals and the provided educational materials after my discharge from the hospital.  Patient/Caregiver Signature _______________________________ Date __________  Clinician Signature _______________________________________ Date __________  Please bring this form and your  medication list with you to all your follow-up doctor's appointments.
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
## 2022-05-11 NOTE — Plan of Care (Signed)
  Problem: RH Bathing Goal: LTG Patient will bathe all body parts with assist levels (OT) Description: LTG: Patient will bathe all body parts with assist levels (OT) Flowsheets (Taken 05/11/2022 1150) LTG: Pt will perform bathing with assistance level/cueing: Independent with assistive device  LTG: Position pt will perform bathing: Shower   Problem: RH Dressing Goal: LTG Patient will perform upper body dressing (OT) Description: LTG Patient will perform upper body dressing with assist, with/without cues (OT). Flowsheets (Taken 05/11/2022 1150) LTG: Pt will perform upper body dressing with assistance level of: Independent with assistive device Goal: LTG Patient will perform lower body dressing w/assist (OT) Description: LTG: Patient will perform lower body dressing with assist, with/without cues in positioning using equipment (OT) Flowsheets (Taken 05/11/2022 1150) LTG: Pt will perform lower body dressing with assistance level of: Independent with assistive device   Problem: RH Toileting Goal: LTG Patient will perform toileting task (3/3 steps) with assistance level (OT) Description: LTG: Patient will perform toileting task (3/3 steps) with assistance level (OT)  Flowsheets (Taken 05/11/2022 1150) LTG: Pt will perform toileting task (3/3 steps) with assistance level: Independent with assistive device   Problem: RH Simple Meal Prep Goal: LTG Patient will perform simple meal prep w/assist (OT) Description: LTG: Patient will perform simple meal prep with assistance, with/without cues (OT). Flowsheets (Taken 05/11/2022 1150) LTG: Pt will perform simple meal prep with assistance level of: Independent with assistive device LTG: Pt will perform simple meal prep w/level of: Wheelchair level   Problem: RH Toilet Transfers Goal: LTG Patient will perform toilet transfers w/assist (OT) Description: LTG: Patient will perform toilet transfers with assist, with/without cues using equipment  (OT) Flowsheets (Taken 05/11/2022 1150) LTG: Pt will perform toilet transfers with assistance level of: Independent with assistive device   Problem: RH Tub/Shower Transfers Goal: LTG Patient will perform tub/shower transfers w/assist (OT) Description: LTG: Patient will perform tub/shower transfers with assist, with/without cues using equipment (OT) Flowsheets (Taken 05/11/2022 1150) LTG: Pt will perform tub/shower stall transfers with assistance level of: Supervision/Verbal cueing LTG: Pt will perform tub/shower transfers from: Tub/shower combination
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
## 2022-05-11 NOTE — Progress Notes (Signed)
Inpatient Rehabilitation  Patient information reviewed and entered into eRehab system by Nollan Muldrow M. Daylin Eads, M.A., CCC/SLP, PPS Coordinator.  Information including medical coding, functional ability and quality indicators will be reviewed and updated through discharge.    
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
## 2022-05-11 NOTE — Progress Notes (Signed)
Inpatient Rehabilitation Care Coordinator Assessment and Plan Patient Details  Name: Karen Curry MRN: [id number] Date of Birth: 1996/01/29  Today's Date: 05/11/2022  Hospital Problems: Principal Problem:   TBI (traumatic brain injury) [facility]) Active Problems:   Multiple closed fractures of pelvis ([place])   Acute blood loss anemia  Past Medical History:  Past Medical History:  Diagnosis Date   Asthma    Kidney stones    26 yo   Kidney stones    2021   UTI (urinary tract infection)    Past Surgical History:  Past Surgical History:  Procedure Laterality Date   DILATION AND EVACUATION  12/21/2021   Procedure: DILATATION AND EVACUATION;  Surgeon: Rowland Lathe, MD;  Location: [place];  Service: Gynecology;;   NO PAST SURGERIES     OPERATIVE ULTRASOUND  12/21/2021   Procedure: OPERATIVE ULTRASOUND;  Surgeon: Rowland Lathe, MD;  Location: [place];  Service: Gynecology;;   Social History:  reports that she has never smoked. She has never used smokeless tobacco. She reports current drug use. Drug: Marijuana. She reports that she does not drink alcohol.  Family / Support Systems Marital Status: Single Patient Roles: Parent Spouse/Significant Other: N/A Children: two children 14 yr old dtr Karen Curry) and 46 yr old son (Karen Curry). Other Supports: aunts Anticipated Caregiver: PRN support from family members Ability/Limitations of Caregiver: Pt mother is primary contact and she works. PRN support from aunts that live locally and are currently helping provide assistance with her children. Caregiver Availability: Intermittent Family Dynamics: Pt lives with her two children.  Social History Preferred language: English Religion: Christian Cultural Background: Pt was working as Haematologist at [employer] Education: high school grad [place] - How often do you need to have someone help you when you read instructions, pamphlets, or other written material from your doctor or  pharmacy?: Never Writes: Yes Employment Status: Employed Name of Employer: [employer] of Employment: 2 (months) Return to Work Plans: TBD Public relations account executive Issues: Denies Guardian/Conservator: N/A   Abuse/Neglect Abuse/Neglect Assessment Can Be Completed: Yes Physical Abuse: Denies Verbal Abuse: Denies Sexual Abuse: Denies Exploitation of patient/patient's resources: Denies Self-Neglect: Denies  Patient response to: Social Isolation - How often do you feel lonely or isolated from those around you?: Never  Emotional Status Pt's affect, behavior and adjustment status: Pt was sleeping at time of visit and awakened to meet with SW. She is in good spirits and admits to some depression since being here as she misses her children. Recent Psychosocial Issues: See above Psychiatric History: Denies any hx Substance Abuse History: Denies any  Patient / Family Perceptions, Expectations & Goals Pt/Family understanding of illness & functional limitations: Pt has general understanding of care needs Premorbid pt/family roles/activities: Independent Anticipated changes in roles/activities/participation: Assistance with ADLs/IADLs Pt/family expectations/goals: Pt goal is to work on walking and speech. Pt states she has "always had a stutter but not this bad."  [employer]: None Premorbid Home Care/DME Agencies: None Transportation available at discharge: TBD Is the patient able to respond to transportation needs?: Yes In the past 12 months, has lack of transportation kept you from medical appointments or from getting medications?: No In the past 12 months, has lack of transportation kept you from meetings, work, or from getting things needed for daily living?: No Resource referrals recommended: Neuropsychology  Discharge Planning Living Arrangements: Children, Parent Support Systems: Parent, Other relatives Type of Residence: Private  residence Insurance Resources: Multimedia programmer (specify) ([place]  [place]) Financial Resources: Radio broadcast assistant Screen Referred: No Living Expenses: Lives with family Money Management: Family Does the patient have any problems obtaining your medications?: No Home Management: Pt and mother help manage homecare needs Patient/Family Preliminary Plans: TBD Care Coordinator Barriers to Discharge: Decreased caregiver support, Lack of/limited family support, Insurance for SNF coverage Care Coordinator Anticipated Follow Up Needs: HH/OP  Clinical Impression SW met with pt in room to introduce self, explain role, and discuss discharge process. Pt is not a English as a second language teacher. No HCPOA formal paperwork but designates her mother Karen Curry. No DME needs. Pt aware SW will follow-up with her mother.   1608-SW spoke with pt mother Karen Curry to introduce self, explain role, and discuss discharge process. She confirms pt will come back to her home. Natural supports remain limited. SW will follow-up once there is more information.   Renia Mikelson A Karoline Fleer 05/11/2022, 2:10 PM

## 2022-05-11 NOTE — Progress Notes (Addendum)
Occupational Therapy Session TBI Note  Patient Details  Name: Karen Curry MRN: [id number] Date of Birth: 05/10/1996  Today's Date: 05/11/2022 OT Individual Time: 4290-3795 OT Individual Time Calculation (min): 26 min    Short Term Goals: Week 1:  OT Short Term Goal 1 (Week 1): STG = LTG d/t ELOS  Skilled Therapeutic Interventions/Progress Updates:    Patient agreeable to participate in OT session. Reports 8/10 pain level in LLE. Pain meds requested and nursing administered during session.   Patient participated in skilled OT session focusing on therapeutic activities involving safety awareness/judgement, recall of WB status with follow through, standing balance, sit to stand transitions, and fine motor coordination assessment. Therapist educated patient on proper hand placement prior to standing or sitting with RW in order to promote carry over and independence with following WB restrictions to the LLE. Pt was able to demonstrate good carry over of sit to stand technique although will continue to benefit from more education on stand to sit technique.  Pt completed functional transfer from bed to w/c with RW at SBA.  Fine motor coordination assessed with 9 hole peg test with results WFL. (See evaluation note for results) Pt complete dynamic/static standing balance task utilizing RW and Squigz to improve standing tolerance and safety while completing tasks such as toilet transfer. Pt placed Squigz vertical surface using both hands, completed cognitive direction following task using dry erase marker on vertical surface prior to removing [place]. Lastly, pt used Windex to clean window. All tasks completed standing with SBA. 3 seated rest breaks were utilized.    Therapy Documentation Precautions:  Precautions Precautions: Fall Restrictions Weight Bearing Restrictions: Yes LLE Weight Bearing: Touchdown weight bearing    05/11/22 1601  Observation Details  Observation Environment Day Room   Start of observation period - Date 05/11/22  Start of observation period - Time 1504  End of observation period - Date 05/11/22  End of observation period - Time 1530  Agitated Behavior Scale (DO NOT LEAVE BLANKS)  Short attention span, easy distractibility, inability to concentrate 2  Impulsive, impatient, low tolerance for pain or frustration 1  Uncooperative, resistant to care, demanding 1  Violent and/or threatening violence toward people or property 1  Explosive and/or unpredictable anger 1  Rocking, rubbing, moaning, or other self-stimulating behavior 1  Pulling at tubes, restraints, etc. 1  Wandering from treatment areas 1  Restlessness, pacing, excessive movement 1  Repetitive behaviors, motor, and/or verbal 1  Rapid, loud, or excessive talking 1  Sudden changes of mood 1  Easily initiated or excessive crying and/or laughter 1  Self-abusiveness, physical and/or verbal 1  Agitated behavior scale total score 15    Therapy/Group: Individual Therapy  Limmie Patricia, OTR/L,CBIS  Supplemental OT - [facility] and [facility]  05/11/2022, 3:44 PM
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
## 2022-05-12 ENCOUNTER — Inpatient Hospital Stay (HOSPITAL_COMMUNITY): Payer: Medicaid Other

## 2022-05-12 DIAGNOSIS — S069X2S Unspecified intracranial injury with loss of consciousness of 31 minutes to 59 minutes, sequela: Secondary | ICD-10-CM | POA: Diagnosis not present

## 2022-05-12 DIAGNOSIS — D62 Acute posthemorrhagic anemia: Secondary | ICD-10-CM | POA: Diagnosis not present

## 2022-05-12 DIAGNOSIS — R609 Edema, unspecified: Secondary | ICD-10-CM

## 2022-05-12 DIAGNOSIS — S3282XS Multiple fractures of pelvis without disruption of pelvic ring, sequela: Secondary | ICD-10-CM | POA: Diagnosis not present

## 2022-05-12 MED ORDER — APIXABAN 5 MG PO TABS
10.0000 mg | ORAL_TABLET | Freq: Two times a day (BID) | ORAL | Status: DC
  Administered 2022-05-12 – 2022-05-18 (×12): 10 mg via ORAL
  Filled 2022-05-12 (×12): qty 2

## 2022-05-12 MED ORDER — APIXABAN 5 MG PO TABS: 5.0000 mg | ORAL_TABLET | Freq: Two times a day (BID) | ORAL | Status: DC
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
## 2022-05-12 NOTE — Progress Notes (Addendum)
Patient ID: Karen Curry, female   DOB: Oct 02, 1995, 26 y.o.   MRN: [id number]  SW spoke with pt mother [name] to inform on ELOS, and discuss family edu. She will come in on Monday (12/18) 8am-11am.   SW requested an interpreter for family edu on Monday (18/18) 8am-11am for  "[place]" and waiting on follow-up.   Cecile Sheerer, MSW, LCSWA Office: [phone] Cell: [phone] Fax: [phone]

## 2022-05-12 NOTE — Progress Notes (Signed)
Speech Language Pathology TBI Note  Patient Details  Name: Karen Curry MRN: [id number] Date of Birth: [birth year]  Today's Date: 05/12/2022 SLP Individual Time: 1450-1540 SLP Individual Time Calculation (min): 50 min  Short Term Goals: Week 1: SLP Short Term Goal 1 (Week 1): STG=LTG due to ELOS  Skilled Therapeutic Interventions: Skilled ST treatment focused on cognitive-communication goals. Pt was greeted in wheelchair following OT session. Pt independently recalled weight-bearing precautions and other techniques learned in PT/OT sessions. Pt exhibited mostly fluent speech with minimal instances of stuttering behavior. Pt implemented "pull out" strategy with infrequent initial syllable repetitions at the mod I-to-supervision level with conversational speech. Significant improvements were noted today and pt stated she has been very intentional about implementing pull out strategy as discussed with SLP yesterday to reduce and repair of stuttering occurrences. SLP facilitated alternating attention and memory using the BITS in order to recall words in sequential order to achieve 50% accuracy at supervision level progressing to 75% with min A cues to implement repetition, association, and visualization strategies. SLP also facilitated a trail making task in which pt completed with overall sup A verbal cues for working memory and error awareness and repair. SLP facilitated education regarding pt's current medication regime. Pt was independent with medication label comprehension, organization, and problem solving various hypothetical scenarios. Pt transferred to bed at end of session with sup A cues to avoid placing weight through L lower extremity. Patient was left in bed with alarm activated and immediate needs within reach at end of session. Continue per current plan of care.      Pain  None/denied  Agitated Behavior Scale: TBI Observation Details Observation Environment: ciR Start of observation  period - Date: 05/12/22 Start of observation period - Time: 1450 End of observation period - Date: 05/12/22 End of observation period - Time: 1540 Agitated Behavior Scale (DO NOT LEAVE BLANKS) Short attention span, easy distractibility, inability to concentrate: Present to a slight degree Impulsive, impatient, low tolerance for pain or frustration: Absent Uncooperative, resistant to care, demanding: Absent Violent and/or threatening violence toward people or property: Absent Explosive and/or unpredictable anger: Absent Rocking, rubbing, moaning, or other self-stimulating behavior: Absent Pulling at tubes, restraints, etc.: Absent Wandering from treatment areas: Absent Restlessness, pacing, excessive movement: Absent Repetitive behaviors, motor, and/or verbal: Absent Rapid, loud, or excessive talking: Absent Sudden changes of mood: Absent Easily initiated or excessive crying and/or laughter: Absent Self-abusiveness, physical and/or verbal: Absent Agitated behavior scale total score: 15  Therapy/Group: Individual Therapy  Tamala Ser 05/12/2022, 3:45 PM

## 2022-05-12 NOTE — Progress Notes (Signed)
Occupational Therapy TBI Note  Patient Details  Name: Karen Curry MRN: [id number] Date of Birth: 12/28/1995  Today's Date: 05/12/2022 OT Individual Time: 1594-5859 OT Individual Time Calculation (min): 54 min    Short Term Goals: Week 1:  OT Short Term Goal 1 (Week 1): STG = LTG d/t ELOS  Skilled Therapeutic Interventions/Progress Updates:    Pt received in bed with unrated LLE pain. medicaiton provided for pain relief  Therapeutic activity Pt completes kitchen search into various height cabinets/appliaces in prep for IADL retraining from abualtory level with mod cuing for safety/positioning throughout environment/WB precautions. Pt uses walker tray AE to improve reach/safety and transportation of items with education on energy conservation techniques throughout activity.  Pt attempts 2 inch step in // bars 2x with OT foot under pt L foot with clear pushing through. Discussed compensating with arms pushing through BUE to power up to step, but pt continues to WB through foot therefore terminated activity.  Pt completes Wii bowling in standing position with CGA and mod cuing for remote use/game strategy. Activity completed to work on dynamic balance needed for BADLs and functional transfers.  Pt left at end of session in bed with exit alarm on, call light in reach and all needs met   Therapy Documentation Precautions:  Precautions Precautions: Fall Restrictions Weight Bearing Restrictions: Yes LLE Weight Bearing: Touchdown weight bearing General:   Vital Signs:   Pain:   Agitated Behavior Scale: TBI  Observation Details Observation Environment: CIR Start of observation period - Date: 05/12/22 Start of observation period - Time: 1120 End of observation period - Date: 05/12/22 End of observation period - Time: 1200 Agitated Behavior Scale (DO NOT LEAVE BLANKS) Short attention span, easy distractibility, inability to concentrate: Present to a slight degree Impulsive,  impatient, low tolerance for pain or frustration: Absent Uncooperative, resistant to care, demanding: Absent Violent and/or threatening violence toward people or property: Absent Explosive and/or unpredictable anger: Absent Rocking, rubbing, moaning, or other self-stimulating behavior: Absent Pulling at tubes, restraints, etc.: Absent Wandering from treatment areas: Absent Restlessness, pacing, excessive movement: Absent Repetitive behaviors, motor, and/or verbal: Absent Rapid, loud, or excessive talking: Absent Sudden changes of mood: Absent Easily initiated or excessive crying and/or laughter: Absent Self-abusiveness, physical and/or verbal: Absent Agitated behavior scale total score: 15   Therapy/Group: Individual Therapy  Tonny Branch 05/12/2022, 12:16 PM

## 2022-05-12 NOTE — Care Management (Signed)
Inpatient Rehabilitation Center Individual Statement of Services  Patient Name:  Sparrow [place]  Date:  05/12/2022  Welcome to the Inpatient Rehabilitation Center.  Our goal is to provide you with an individualized program based on your diagnosis and situation, designed to meet your specific needs.  With this comprehensive rehabilitation program, you will be expected to participate in at least 3 hours of rehabilitation therapies Monday-Friday, with modified therapy programming on the weekends.  Your rehabilitation program will include the following services:  Physical Therapy (PT), Occupational Therapy (OT), Speech Therapy (ST), 24 hour per day rehabilitation nursing, Therapeutic Recreaction (TR), Psychology, Neuropsychology, Care Coordinator, Rehabilitation Medicine, Nutrition Services, Pharmacy Services, and Other  Weekly team conferences will be held on Tuesdays to discuss your progress.  Your Inpatient Rehabilitation Care Coordinator will talk with you frequently to get your input and to update you on team discussions.  Team conferences with you and your family in attendance may also be held.  Expected length of stay: 7-10 days  Overall anticipated outcome: Independent with assistive device  Depending on your progress and recovery, your program may change. Your Inpatient Rehabilitation Care Coordinator will coordinate services and will keep you informed of any changes. Your Inpatient Rehabilitation Care Coordinator's name and contact numbers are listed  below.  The following services may also be recommended but are not provided by the Inpatient Rehabilitation Center:  Driving Evaluations Home Health Rehabiltiation Services Outpatient Rehabilitation Services Vocational Rehabilitation   Arrangements will be made to provide these services after discharge if needed.  Arrangements include referral to agencies that provide these services.  Your insurance has been verified to be:  [place] [employer]  [employer]  Your primary doctor is:  NO PCP listed  Pertinent information will be shared with your doctor and your insurance company.  Inpatient Rehabilitation Care Coordinator:  Susie Cassette [phone] or [phone]  Information discussed with and copy given to patient by: Gretchen Short, 05/12/2022, 11:29 AM

## 2022-05-12 NOTE — Plan of Care (Signed)
  Problem: RH Expression Communication Goal: LTG Patient will verbally express basic/complex needs(SLP) Description: LTG:  Patient will verbally express basic/complex needs, wants or ideas with cues  (SLP) Flowsheets (Taken 05/12/2022 0758) LTG: Patient will verbally express basic/complex needs, wants or ideas (SLP): Modified Independent   Problem: RH Problem Solving Goal: LTG Patient will demonstrate problem solving for (SLP) Description: LTG:  Patient will demonstrate problem solving for basic/complex daily situations with cues  (SLP) Flowsheets (Taken 05/12/2022 0758) LTG: Patient will demonstrate problem solving for (SLP): Complex daily situations LTG Patient will demonstrate problem solving for: Modified Independent   Problem: RH Memory Goal: LTG Patient will demonstrate ability for day to day (SLP) Description: LTG:   Patient will demonstrate ability for day to day recall/carryover during cognitive/linguistic activities with assist  (SLP) Flowsheets (Taken 05/12/2022 0758) LTG: Patient will demonstrate ability for day to day recall: New information LTG: Patient will demonstrate ability for day to day recall/carryover during cognitive/linguistic activities with assist (SLP): Modified Independent Goal: LTG Patient will use memory compensatory aids to (SLP) Description: LTG:  Patient will use memory compensatory aids to recall biographical/new, daily complex information with cues (SLP) Flowsheets (Taken 05/12/2022 0758) LTG: Patient will use memory compensatory aids to (SLP): Modified Independent

## 2022-05-12 NOTE — Progress Notes (Signed)
Physical Therapy Session Note  Patient Details  Name: Karen Curry MRN: [id number] Date of Birth: [birth year]  Today's Date: 05/12/2022 PT Individual Time: 0902-0959 PT Individual Time Calculation (min): 57 min   Short Term Goals: Week 1:  PT Short Term Goal 1 (Week 1): STGs = LTGs  Skilled Therapeutic Interventions/Progress Updates:     Pt received transferring off toilet with NT. PT reminds pt of TDWB precautions as pt appears to be WB through L lower extremity, but pt states that she is offloading on R lower extremity. Reports pain is not bed in L lower extremity. PT provides rest breaks as needed to manage pain. Pt self propels WC to gym for endurance training. Stand step transfer to mat table with RW and PT cueing for body mechanics and sequencing. Pt noted to be putting too much weight through L lower extremity, as PT has his foot under pt's L foot. Pt educated on importance of maintaining precautions to allow for optimal healing. Pt verbalizes understanding. Pt performs sit to supine with cues for positioning. Pt performs following therex in supine for strengthening and ROM: 1x15 Ankle pumps, quad sets, glute sets, hip abd/adduction, SAQs, heel slides, and SLRs. PT provides verbal and tactile cues for correct performance and provides AAROM for hip abduction, heels slides, and SLRs. Supine to sit with cues for positioning. Pt tasked with ambulating as far as possible while maintaining good body mechanics and not WB through L lower extremity. Pt able to ambulates 2x40' with extended seated rest break. PT provides cues for body mechanics, AD management, and maintaining upright gaze to improve balance. Pt left seated in WC with all needs within reach.   Therapy Documentation Precautions:  Precautions Precautions: Fall Restrictions Weight Bearing Restrictions: Yes LLE Weight Bearing: Touchdown weight bearing    Therapy/Group: Individual Therapy  Beau Fanny, PT, DPT 05/12/2022,  5:39 PM
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
## 2022-05-12 NOTE — Progress Notes (Signed)
BLE venous duplex has been completed.  Preliminary results given [place], LPN.   Results can be found under chart review under CV PROC. 05/12/2022 1:56 PM Marcheta Horsey RVT, RDMS

## 2022-05-12 NOTE — Progress Notes (Signed)
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  Patient currently on Lovenox recommendations are to transition to Eliquis.
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
## 2022-05-13 ENCOUNTER — Encounter (HOSPITAL_COMMUNITY): Payer: Self-pay | Admitting: Student

## 2022-05-13 DIAGNOSIS — F0630 Mood disorder due to known physiological condition, unspecified: Secondary | ICD-10-CM

## 2022-05-13 DIAGNOSIS — S069X2S Unspecified intracranial injury with loss of consciousness of 31 minutes to 59 minutes, sequela: Secondary | ICD-10-CM | POA: Diagnosis not present

## 2022-05-13 DIAGNOSIS — S069X2A Unspecified intracranial injury with loss of consciousness of 31 minutes to 59 minutes, initial encounter: Secondary | ICD-10-CM

## 2022-05-13 DIAGNOSIS — D62 Acute posthemorrhagic anemia: Secondary | ICD-10-CM | POA: Diagnosis not present

## 2022-05-13 DIAGNOSIS — S3282XA Multiple fractures of pelvis without disruption of pelvic ring, initial encounter for closed fracture: Secondary | ICD-10-CM

## 2022-05-13 DIAGNOSIS — S3282XS Multiple fractures of pelvis without disruption of pelvic ring, sequela: Secondary | ICD-10-CM | POA: Diagnosis not present

## 2022-05-13 NOTE — Progress Notes (Signed)
Occupational Therapy TBI Note  Patient Details  Name: Karen Curry MRN: [id number] Date of Birth: 1995-12-28  Today's Date: 05/13/2022 OT Individual Time: 5883-2549 OT Individual Time Calculation (min): 45 min    Short Term Goals: Week 1:  OT Short Term Goal 1 (Week 1): STG = LTG d/t ELOS  Skilled Therapeutic Interventions/Progress Updates:    Session 1 Pt greeted semi-reclined in bed awake and agreeable to OT treatment session focused on self-care retraining. Pt completed bed mobility with supervision. She reported no pain bc she just received pain medications. Pt completed stand-pivot to wc with with RW and CGA + cues to maintain NWB. Bathing/dressing completed sit<>stand from wc at the sink with education provided for LB dressing techniques. OT talked through use of reacher to thread L LE first in clothing and will trial that this afternoon. Pt with poor safety awareness at times requiring cues for NWB and to lock wc breaks prior to stands. Pt completed 3 sets of 10 LB there-ex seated hip extension, knee extension, and ankle pumps. Pt left up in wc with alarm on, call bell and needs met.   Therapy Documentation Precautions:  Precautions Precautions: Fall Restrictions Weight Bearing Restrictions: Yes LLE Weight Bearing: Touchdown weight bearing Pain:  Denies pain Agitated Behavior Scale: TBI Observation Details Observation Environment: CIR Start of observation period - Date: 05/13/22 Start of observation period - Time: 0800 End of observation period - Date: 05/13/22 End of observation period - Time: 0845 Agitated Behavior Scale (DO NOT LEAVE BLANKS) Short attention span, easy distractibility, inability to concentrate: Present to a slight degree Impulsive, impatient, low tolerance for pain or frustration: Absent Uncooperative, resistant to care, demanding: Absent Violent and/or threatening violence toward people or property: Absent Explosive and/or unpredictable anger:  Absent Rocking, rubbing, moaning, or other self-stimulating behavior: Absent Pulling at tubes, restraints, etc.: Absent Wandering from treatment areas: Absent Restlessness, pacing, excessive movement: Absent Repetitive behaviors, motor, and/or verbal: Absent Rapid, loud, or excessive talking: Absent Sudden changes of mood: Present to a slight degree Easily initiated or excessive crying and/or laughter: Absent Self-abusiveness, physical and/or verbal: Absent Agitated behavior scale total score: 16    Therapy/Group: Individual Therapy  Valma Cava 05/13/2022, 9:05 AM

## 2022-05-13 NOTE — Progress Notes (Signed)
Occupational Therapy Note  Patient Details  Name: Karen Curry MRN: [id number] Date of Birth: 23-Nov-1995  Today's Date: 05/13/2022 OT Missed Time: 65 Minutes Missed Time Reason: Patient fatigue  Patient greeted semi-reclined in bed asleep.Pt aroused shortly and requested to stay in bed despite encouragement. Pt left semi-reclined in bed with needs met.   Daneen Schick Joanna Borawski 05/13/2022, 4:11 PM

## 2022-05-13 NOTE — IPOC Note (Signed)
Overall Plan of Care [facility]) Patient Details Name: Karen Curry MRN: [id number] DOB: September 01, 1995  Admitting Diagnosis: TBI (traumatic brain injury) [facility])  Hospital Problems: Principal Problem:   TBI (traumatic brain injury) (HCC) Active Problems:   Multiple closed fractures of pelvis (HCC)   Acute blood loss anemia     Functional Problem List: Nursing Bladder, Bowel, Endurance, Medication Management, Pain, Safety, Edema  PT Balance, Endurance, Motor, Pain, Safety  OT Balance, Safety, Endurance, Pain  SLP Cognition, Motor, Linguistic  TR         Basic ADL's: OT Toileting, Dressing, Bathing, Grooming     Advanced  ADL's: OT       Transfers: PT Bed Mobility, Bed to Chair, Car, Lobbyist, Technical brewer: PT Ambulation, Psychologist, prison and probation services, Stairs     Additional Impairments: OT    SLP Communication, Social Cognition expression Memory, Problem Solving  TR      Anticipated Outcomes Item Anticipated Outcome  Self Feeding    Swallowing      Basic self-care  Mod I  Toileting  Mod I   Bathroom Transfers Mod i  Bowel/Bladder  manage bowel w mod I and bladder w toileiting  Transfers  Mod(I)  Locomotion  Mod(I)  Communication  mod I  Cognition  mod I  Pain  < 4 with prns  Safety/Judgment  manage safety w cues   Therapy Plan: PT Intensity: Minimum of 1-2 x/day ,45 to 90 minutes PT Frequency: 5 out of 7 days PT Duration Estimated Length of Stay: 7-10 Days OT Intensity: Minimum of 1-2 x/day, 45 to 90 minutes OT Frequency: 5 out of 7 days OT Duration/Estimated Length of Stay: 7-10 days SLP Intensity: Minumum of 1-2 x/day, 30 to 90 minutes SLP Frequency: 3 to 5 out of 7 days SLP Duration/Estimated Length of Stay: 7-10 days   Team Interventions: Nursing Interventions Patient/Family Education, Bladder Management, Bowel Management, Disease Management/Prevention, Pain Management, Medication Management, Discharge Planning  PT  interventions Ambulation/gait training, Community reintegration, DME/adaptive equipment instruction, Neuromuscular re-education, Stair training, Psychosocial support, UE/LE Strength taining/ROM, Warden/ranger, Discharge planning, Functional electrical stimulation, Pain management, Skin care/wound management, UE/LE Coordination activities, Therapeutic Activities, Wheelchair propulsion/positioning, Cognitive remediation/compensation, Disease management/prevention, Functional mobility training, Patient/family education, Splinting/orthotics, Therapeutic Exercise, Visual/perceptual remediation/compensation  OT Interventions Balance/vestibular training, Discharge planning, Pain management, Self Care/advanced ADL retraining, Therapeutic Activities, Therapeutic Exercise, Patient/family education, Community reintegration, Neuromuscular re-education, DME/adaptive equipment instruction, Psychosocial support, UE/LE Strength taining/ROM, Wheelchair propulsion/positioning  SLP Interventions Cognitive remediation/compensation, Functional tasks, Patient/family education, Speech/Language facilitation, Therapeutic Activities  TR Interventions    SW/CM Interventions Discharge Planning, Psychosocial Support, Patient/Family Education   Barriers to Discharge MD  Medical stability  Nursing Decreased caregiver support, Home environment access/layout, Weight bearing restrictions 1 level 3 ste no rails w mother and 70/71 year old children  PT      OT      SLP      SW Decreased caregiver support, Lack of/limited family support, Community education officer for SNF coverage     Team Discharge Planning: Destination: PT-Home ,OT- Home , SLP-Home Projected Follow-up: PT-Outpatient PT, OT-  None, SLP-Other (comment) (TBD) Projected Equipment Needs: PT-To be determined, OT- 3 in 1 bedside comode, Tub/shower bench, Wheelchair (measurements), Wheelchair cushion (measurements), Rolling walker with 5" wheels, SLP-None recommended by  SLP Equipment Details: PT- , OT-  Patient/family involved in discharge planning: PT- Patient, Family member/caregiver,  OT-Patient, SLP-Patient  MD ELOS: 7-10 days Medical Rehab Prognosis:  Excellent Assessment: The patient has  been admitted for CIR therapies with the diagnosis of TBI with polytrauma. The team will be addressing functional mobility, strength, stamina, balance, safety, adaptive techniques and equipment, self-care, bowel and bladder mgt, patient and caregiver education, cognition, behavior, speech, community reentry. Goals have been set at mod I with mobility, self-care and cognition/communication. Anticipated discharge destination is home.        See Team Conference Notes for weekly updates to the plan of care

## 2022-05-13 NOTE — Progress Notes (Signed)
Speech Language Pathology TBI Note  Patient Details  Name: Karen Curry MRN: [id number] Date of Birth: 14-Dec-1995  Today's Date: 05/13/2022 SLP Individual Time: 1601-0932 SLP Individual Time Calculation (min): 45 min  Short Term Goals: Week 1: SLP Short Term Goal 1 (Week 1): STG=LTG due to ELOS  Skilled Therapeutic Interventions: Skilled treatment session focused on cognitive goals. Upon arrival, patient was awake while upright in the wheelchair. SLP facilitated session with a functional conversation that focused on recall of weightbearing precautions, strategies for dysfluencies, and recall of events from previous therapy sessions. Patient was overall Mod I throughout. SLP also facilitated session with a complex scheduling task that focused on problem solving and organization. Patient completed task with overall Min verbal cues. Patient left upright in wheelchair with alarm on and all needs within reach. Continue with current plan of care.      Pain No/Denies Pain   Agitated Behavior Scale: TBI  ABS discontinued d/t ABS score less than 20 for the last three days or no behaviors present   Therapy/Group: Individual Therapy  Karen Curry 05/13/2022, 3:19 PM
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
## 2022-05-13 NOTE — Progress Notes (Signed)
Physical Therapy Session Note  Patient Details  Name: Karen Curry MRN: [id number] Date of Birth: [birth year]  Today's Date: 05/13/2022 PT Individual Time: 2778-2423 PT Individual Time Calculation (min): 57 min   Short Term Goals: Week 1:  PT Short Term Goal 1 (Week 1): STGs = LTGs  Skilled Therapeutic Interventions/Progress Updates:     Pt received supine in bed and agrees to therapy. States she is tired from not sleeping much due to pain. Reports pain is better at the moment because she has had pain meds recently. PT provides rest breaks as needed to manage pain. Pt performs bed mobility without assistance. Pt performs stand pivot to WC with minA and no AD, with cues for body mechanics and sequencing. WC transport to gym for time management. Stand pivot to Nustep with RW and CGA, with cues to reach back for arm rests for increased safety of transfer. Pt completes Nustep for endurance training, with L lower extremity propped on stool to avoid use. Pt completes x10:00 at workload of 5 with average steps per minute ~45-50. PT provides cues for foot and hand placement and completing full available ROM.   Pt ambulates x70' with RW and CGA, with cues to decrease gait speed to increase safety with AD, as well as cues for body mechanics and safe sequencing of transfer back to WC. Pt ambulates additional bouts of 50', 25', and 25' with seated rest breaks.   Stand step back to bed with CGA and cues for positioning. Left supine with alarm intact and all needs within reach.  Therapy Documentation Precautions:  Precautions Precautions: Fall Restrictions Weight Bearing Restrictions: Yes LLE Weight Bearing: Touchdown weight bearing    Therapy/Group: Individual Therapy  Beau Fanny, PT, DPT 05/13/2022, 5:18 PM

## 2022-05-14 DIAGNOSIS — F0630 Mood disorder due to known physiological condition, unspecified: Secondary | ICD-10-CM

## 2022-05-14 DIAGNOSIS — F4389 Other reactions to severe stress: Secondary | ICD-10-CM

## 2022-05-14 DIAGNOSIS — S069XAA Unspecified intracranial injury with loss of consciousness status unknown, initial encounter: Secondary | ICD-10-CM

## 2022-05-14 DIAGNOSIS — S069X3S Unspecified intracranial injury with loss of consciousness of 1 hour to 5 hours 59 minutes, sequela: Secondary | ICD-10-CM

## 2022-05-14 DIAGNOSIS — S069XAS Unspecified intracranial injury with loss of consciousness status unknown, sequela: Secondary | ICD-10-CM

## 2022-05-14 MED ORDER — DIPHENHYDRAMINE HCL 25 MG PO CAPS
25.0000 mg | ORAL_CAPSULE | Freq: Four times a day (QID) | ORAL | Status: DC | PRN
  Administered 2022-05-15: 25 mg via ORAL
  Filled 2022-05-14 (×2): qty 1

## 2022-05-14 NOTE — Progress Notes (Signed)
Occupational Therapy Session Note  Patient Details  Name: Karen Curry MRN: [id number] Date of Birth: 10-Dec-1995  Today's Date: 05/14/2022 OT Individual Time: 1300-1400 OT Individual Time Calculation (min): 60 min    Short Term Goals: Week 1:  OT Short Term Goal 1 (Week 1): STG = LTG d/t ELOS  Skilled Therapeutic Interventions/Progress Updates:    Pt greeted semi-reclined in bed asleep. Pt able to wake and agreeable to OT treatment session. Pt completed bed mobility with supervision. Stand-pivot to wc with RW and CGA. Pt brought into bathroom and completed stand-pivot to toilet with CGA. Pt voided bladder on regular commode. She completed 3/3 toileting steps with supervision. Pt completed UB there-ex seated on therapy mat. 3 sets of 10 using 3 lb weighted rod. Chest press, bicep curl, triceps press, and straight arm raise. 3 sets of 10 3 lb single weights curl to overhead press. Functional ambulation w/ RW 50 feet with cues to maintain TDWB LLE. OT educated on use of gait belt as leg lifter and she was able to bring LE back into  bed without assistance. Pt returned to bed and left semi-reclined in bed with needs met.   Therapy Documentation Precautions:  Precautions Precautions: Fall Restrictions Weight Bearing Restrictions: Yes LLE Weight Bearing: Touchdown weight bearing  Pain:  Denies pain at this time    Therapy/Group: Individual Therapy  Valma Cava 05/14/2022, 1:46 PM

## 2022-05-14 NOTE — Progress Notes (Addendum)
Speech Language Pathology Daily TBI Session Note  Patient Details  Name: Danielys Madry MRN: [id number] Date of Birth: [birth year]  Today's Date: 05/14/2022 SLP Individual Time: 6606-0045 SLP Individual Time Calculation (min): 55 min  Short Term Goals: Week 1: SLP Short Term Goal 1 (Week 1): STG=LTG due to ELOS  Skilled Therapeutic Interventions: Skilled treatment session focused on cognitive goals. Upon arrival, patient was asleep in the recliner. Patient reported pain and fatigue but patient pre-medicated. SLP facilitated session by providing supervision level verbal cues for recall of her current medications and their functions. Patient organized a BID pill box with overall Mod I and 100% accuracy. SLP also provided patient with a paper chart that she can utilize at home to document PRN medications to maximize overall safety with medication administration. Patient also independently recalled events from previous therapy sessions. Patient requested to return to bed at end of session and required Min verbal cues to maintain weightbearing precautions. Patient left supine in bed with alarm on and all needs within reach. Continue with current plan of care.      Pain  Pain in left leg Patient premedicated   ABS  ABS discontinued d/t ABS score less than 20 for the last three days or no behaviors present   Therapy/Group: Individual Therapy  Zelena Bushong 05/14/2022, 2:45 PM

## 2022-05-14 NOTE — Progress Notes (Signed)
Attempted to see patient several times this morning but was with therapy each time.
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
## 2022-05-14 NOTE — Progress Notes (Signed)
Physical Therapy Session Note  Patient Details  Name: Karen Curry MRN: [id number] Date of Birth: May 31, 1996  Today's Date: 05/14/2022 PT Individual Time: 0915-1000 PT Individual Time Calculation (min): 45 min  and Today's Date: 05/14/2022 PT Missed Time: 45 Minutes Missed Time Reason: Unavailable (Comment);Patient fatigue (15 mins for neuropsych, 30 mins for fatigue)  Short Term Goals: Week 1:  PT Short Term Goal 1 (Week 1): STGs = LTGs  Skilled Therapeutic Interventions/Progress Updates:     Pt misses 15 minutes of skilled PT due to being in Room with Neuropsychologist. Pt received supine in bed and agrees to therapy. Reports some pain in L lower extremity and itching in bilateral lower extremities. Pt noted to be bleeding from multiple shallow abrasions on both legs. Pt informs RN and provides rest breaks as needed to manage pain. Pt requesting to use bed pan. PT encourages pt to get up to use toilet and pt is agreeable. Pt performs bed mobility with cues for positioning at EOB. Pt performs sit to stand to RW with cues for hand placement. Pt ambulates to toilet and transfers with PT cueing for use of grab bar for safety. Pt then requesting shower. Pt transfers onto shower chair with minA management of L lower extremity and cues for sequencing and safety. Pt able to performing bathing upper and lower body without physical assistance. Pt ambulates back to bed with RW and cues to maintain TDWB through L lower extremity, as pt noted to be putting too much weight through L foot during ambulation. Pt assisted with lower body dressing and perform multiple sit to stand transfers with cues for hand placement. Following, pt transfers to [facility] with cues for positioning. Pt self propels WC 2x175' with bilateral upper extremities and minA with cues for proper technique for turns, as pt tends to veer to the L and has difficulty correction. Pt left seate in [facility] with all needs within reach.  2nd: Pt received  supine in bed and requests to rest at this time due to fatigue. PT provides gentle encouragement to participate but pt continues to defer. PT will follow up as able.   Therapy Documentation Precautions:  Precautions Precautions: Fall Restrictions Weight Bearing Restrictions: Yes LLE Weight Bearing: Touchdown weight bearing   Therapy/Group: Individual Therapy  Beau Fanny, PT, DPT 05/14/2022, 4:42 PM

## 2022-05-14 NOTE — Progress Notes (Signed)
Patient ID: Karen Curry, female   DOB: 01/09/1996, 26 y.o.   MRN: [id number]  SW met with pt in room to discuss discharge recommendations: RW, 3in1 BSC, TTB, and outpatient therapies PT/SLP. Prefers [facility] as closer to her home. SW discussed transportation to her appointments. States she can get assistance from her uncle or aunt. SW encouraged her to also use transportation offered through insurance as well.   SW ordered DME with [employer] via parachute.   Loralee Pacas, MSW, [place] Office: [phone] Cell: [phone] Fax: [phone]
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
## 2022-05-15 DIAGNOSIS — S069X2S Unspecified intracranial injury with loss of consciousness of 31 minutes to 59 minutes, sequela: Secondary | ICD-10-CM

## 2022-05-15 DIAGNOSIS — K0889 Other specified disorders of teeth and supporting structures: Secondary | ICD-10-CM

## 2022-05-15 MED ORDER — BENZOCAINE 10 % MT GEL
Freq: Four times a day (QID) | OROMUCOSAL | Status: DC | PRN
  Filled 2022-05-15 (×3): qty 9
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
## 2022-05-15 NOTE — Progress Notes (Signed)
Physical Therapy Session Note  Patient Details  Name: Karen Curry MRN: [id number] Date of Birth: 1995-10-24  Today's Date: 05/15/2022 PT Individual Time: 1415-1520 AND 800-855 PT Individual Time Calculation (min): 65 min  And 55 min Short Term Goals: Week 1:  PT Short Term Goal 1 (Week 1): STGs = LTGs Week 2:    Week 3:     Skilled Therapeutic Interventions/Progress Updates:    Pt initially supine and agreeable to session. Pain 7/10 pelvis, treatment to tolerance, rest breaks as needed.  Supine to sit w/hob elevated and cues tomaintain wbing Dons pants in sit/stand w/min assist, cues for wbing status/attempts to wb equally w/sit to stand w/initial attempt. Squat pivot to wc w/min to cga Wc propulsion 157ft to/from main gym w/supervision, cues at times to use propulsion vs pulling on walls  In parallel bars: LLE Hip abd x 20, hip flexion x 10, knee flexion x 20  Sit to stand wc to walker throughout session requires cues for wbing status and hand placement, cga  Gait:  42ft x 2 w/cga to light min assist, cues for safe pacing, cues for upright posture/forward gaze vs downward gaze, pt ambulates NWBing LLE, occasional pauses/leans on walker for rest break.  Practiced sit to stand w/single hand on walker, prssing up from wc w/single hand 2SETS x 5 reps.  Squat pivot wc to bed w/cga, maintins TDWbing well.  Min assist to manage LLE w/sit to supine.  Pt provided w/warm blanket and nursing notified pt requesting meds. Pt left supine w/rails up x 3, alarm set, bed in lowest position, and needs in reach.  Breakfast tray set up for pt.  PM session PAIN   Pt initially supine and c/o toothache Agreeable to bed level therex: Bilat LE Ankle pumps x 20 Heel slides x 20 TKEs x 20 Clamshells x 20 Glut sets x 20 Hip abd/add x 20   Sup9ine to sit on edge of bed w/supervision Seated: LAQx x 20 Heel/toe raises x 20  Standing w/RW LLE only Hp abd/add x 20 Hip flexion x 20  Dons  pants in sitting/standing w/min to mod assist.  Stand pivot to wc w/RW and cues to maintain TDWBing , cga  Wc propulsion w/bilat Ues x  152ft.  Gait 98ft hallway to room, NWB LLE, cues for safe pacing, cga Sit to supine w/lite min assist for LLE management.  Doffs pants in supine w/min assist. Pt left supine w/rails up x 3, alarm set, bed in lowest position, and needs in reach.  Therapy Documentation Precautions:  Precautions Precautions: Fall Restrictions Weight Bearing Restrictions: Yes LLE Weight Bearing: Touchdown weight bearing      Therapy/Group: Individual Therapy  Shearon Balo 05/15/2022, 3:27 PM

## 2022-05-15 NOTE — Progress Notes (Signed)
Occupational Therapy Session Note  Patient Details  Name: Karen Curry MRN: [id number] Date of Birth: 12-03-1995  Today's Date: 05/15/2022 OT Individual Time: 1100-1200 OT Individual Time Calculation (min): 60 min    Short Term Goals: Week 1:  OT Short Term Goal 1 (Week 1): STG = LTG d/t ELOS  Skilled Therapeutic Interventions/Progress Updates:   Patient in bed upon arrival, pt requested to complete a BADL related task in showering. The pt was able to come from supine to EOB with CGA and was able to transfer to the w/c using the RW with CGA as well.  The pt was instructed on the importance of adhering to TWBA with LLE, as well as, safe transfers going from one surface to the next while incorporating the RW.  The pt was taken into the bathroom and was able to transfer from the w/c to the shower with CGA using the grab bar.  She was able to shower UB/LB with close S. The pt transfer back to the w/c and was rolled to her living quarters to complete UB/LB dressing with SBA. The pt was able to come from sit to stand to donn the pant over her bottom using the RW for additional balance. The  pt was MaxA for donning her non slip socks. The pt rolled to the sink area and brushed her teeth with SBA and was able to transfer to the bed with MinA for managing her LLE. After treating the pt, her call light and bedside table were placed within reach and her alarm was activated.  The pt indicated that she had no pain response secondary to receiving her medication regimen.   Therapy Documentation Precautions:  Precautions Precautions: Fall Restrictions Weight Bearing Restrictions: Yes LLE Weight Bearing: Touchdown weight bearing   Therapy/Group: Individual Therapy  Lavona Mound 05/15/2022, 4:03 PM

## 2022-05-16 MED ORDER — SORBITOL 70 % SOLN
60.0000 mL | Freq: Once | Status: AC
  Administered 2022-05-16: 60 mL via ORAL
  Filled 2022-05-16: qty 60

## 2022-05-17 ENCOUNTER — Other Ambulatory Visit (HOSPITAL_COMMUNITY): Payer: Self-pay

## 2022-05-17 DIAGNOSIS — S069X0S Unspecified intracranial injury without loss of consciousness, sequela: Secondary | ICD-10-CM

## 2022-05-17 LAB — CBC
HCT: 29.7 % — ABNORMAL LOW (ref 36.0–46.0)
Hemoglobin: 9.6 g/dL — ABNORMAL LOW (ref 12.0–15.0)
MCH: 28.1 pg (ref 26.0–34.0)
MCHC: 32.3 g/dL (ref 30.0–36.0)
MCV: 86.8 fL (ref 80.0–100.0)
Platelets: 488 10*3/uL — ABNORMAL HIGH (ref 150–400)
RBC: 3.42 MIL/uL — ABNORMAL LOW (ref 3.87–5.11)
RDW: 18.7 % — ABNORMAL HIGH (ref 11.5–15.5)
WBC: 5.7 10*3/uL (ref 4.0–10.5)
nRBC: 0 % (ref 0.0–0.2)

## 2022-05-17 MED ORDER — OXYCODONE HCL 5 MG PO TABS
5.0000 mg | ORAL_TABLET | ORAL | 0 refills | Status: DC | PRN
  Filled 2022-05-17: qty 30, 3d supply, fill #0

## 2022-05-17 MED ORDER — VITAMIN D (ERGOCALCIFEROL) 1.25 MG (50000 UNIT) PO CAPS
50000.0000 [IU] | ORAL_CAPSULE | ORAL | 0 refills | Status: DC
  Filled 2022-05-17: qty 5, 35d supply, fill #0

## 2022-05-17 MED ORDER — POLYETHYLENE GLYCOL 3350 17 G PO PACK: 17.0000 g | PACK | Freq: Every day | ORAL | 0 refills | Status: DC

## 2022-05-17 MED ORDER — FERROUS SULFATE 325 (65 FE) MG PO TABS
324.0000 mg | ORAL_TABLET | Freq: Every day | ORAL | 0 refills | Status: DC
  Filled 2022-05-17: qty 30, 30d supply, fill #0

## 2022-05-17 MED ORDER — APIXABAN 5 MG PO TABS
5.0000 mg | ORAL_TABLET | Freq: Two times a day (BID) | ORAL | 0 refills | Status: DC
  Filled 2022-05-17: qty 60, 30d supply, fill #0
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
## 2022-05-17 NOTE — Progress Notes (Shared)
Occupational Therapy Discharge Summary  Patient Details  Name: Teneisha Gignac MRN: [id number] Date of Birth: 1995/09/24  Date of Discharge from OT service:May 17, 2022   Patient has met {NUMBERS 0-12:18577} of 7 long term goals due to improved activity tolerance, improved balance, ability to compensate for deficits, improved attention, improved awareness, and improved coordination.  Patient to discharge at overall {[id number]} level.  Patient's care partner is independent to provide the necessary physical assistance at discharge.    Reasons goals not met: ***  Recommendation:  Patient does not require any follow up skilled OT services at this time.   Equipment: BSC, tub bench, RW  Reasons for discharge: treatment goals met and discharge from hospital  Patient/family agrees with progress made and goals achieved: Yes  OT Discharge Precautions/Restrictions  Precautions Precautions: Fall Restrictions Weight Bearing Restrictions: Yes LLE Weight Bearing: Touchdown weight bearing General   Vital Signs   Pain Pain Assessment Pain Scale: 0-10 Pain Score: 4  Pain Type: Surgical pain Pain Location: Leg Pain Orientation: Left Pain Descriptors / Indicators: Aching Pain Frequency: Constant Pain Onset: Gradual Patients Stated Pain Goal: 2 Pain Intervention(s): Repositioned ADL ADL Eating: Independent Where Assessed-Eating: Bed level Grooming: Setup Where Assessed-Grooming: Wheelchair, Sitting at sink Upper Body Bathing: Setup Where Assessed-Upper Body Bathing: Sitting at sink, Wheelchair Lower Body Bathing: Setup Where Assessed-Lower Body Bathing: Sitting at sink, Wheelchair Upper Body Dressing: Supervision/safety Where Assessed-Upper Body Dressing: Sitting at sink, Wheelchair Lower Body Dressing: Minimal assistance (Assist to don left sock only) Where Assessed-Lower Body Dressing: Sitting at sink, Wheelchair Toileting: Supervision/safety Where Assessed-Toileting:  Toilet (BSC over toilet) Toilet Transfer: Close supervision Toilet Transfer Method: Stand pivot Science writer: Grab bars, Bedside commode (over toilet seat) Vision Baseline Vision/History: 0 No visual deficits Perception  Perception: Within Functional Limits Praxis Praxis: Intact Cognition Cognition Overall Cognitive Status: Within Functional Limits for tasks assessed Arousal/Alertness: Awake/alert Safety/Judgment: Impaired Comments: Slightly impulsive [employer] Scales of Cognitive Functioning: Purposeful, Appropriate Sensation Sensation Light Touch: Appears Intact Coordination Gross Motor Movements are Fluid and Coordinated: Yes Fine Motor Movements are Fluid and Coordinated: Yes Finger Nose Finger Test: Intact Motor  Motor Motor: Within Functional Limits Mobility  Bed Mobility Bed Mobility: Supine to Sit;Sit to Supine Supine to Sit: Independent Sit to Supine: Independent Transfers Sit to Stand: Independent with assistive device Stand to Sit: Independent with assistive device  Trunk/Postural Assessment     Balance   Extremity/Trunk Assessment       Sage Kopera, Clarene Duke 05/17/2022, 12:27 PM

## 2022-05-17 NOTE — Patient Instructions (Signed)

## 2022-05-17 NOTE — Progress Notes (Signed)
Occupational Therapy Session TBI Note  Patient Details  Name: Karen Curry MRN: [id number] Date of Birth: 03-Aug-1995  Session 1: Today's Date: 05/17/2022 OT Individual Time: 0768-0881 OT Individual Time Calculation (min): 57 min   Session 2: OT Individual Time: 1303- 1345 OT Individual Time Calculation (min): 42 min  Short Term Goals: Week 1:  OT Short Term Goal 1 (Week 1): STG = LTG d/t ELOS  Skilled Therapeutic Interventions/Progress Updates:    Session 1: Completed family training with patient and mother, [name]. Therapist educated family regarding bathing, dressing, functional transfer including toilet and tub/shower. Education also provided on strategies and compensatory techniques to use if AE were needed to complete LB ADL, where to stand when pt is completing functional mobility. Education provided for gait belt use and handling technique. Mother completed hands on training for functional mobility in pt's room using RW, toilet transfer, bathing, dressing and tub/shower transfer utilizing ETB with therapist providing VC as needed for technique and form. All education completed and all questions. Pt reports 5-6/10 level in LLE and received pain meds at end of session. Pt provided with SBA for functional mobility from bed to toilet and then to walk-in shower using RW. Therapist educated pt on proper hand placement when using RW during sit<>stand transitions. Throughout session, pt required frequent VC for reminders. Mother assisted pt with donning non-skid socks while seated on EOB. Pt was able to don scrub pants with SBA without use of AE. DME ordered for home: [facility] and extended tub bench.  Session 2: Patient agreeable to participate in OT session. Reports decreased pain level from this morning. No pain number provided.    Patient participated in skilled OT session focusing on BUE strengthening and bed mobility. Therapist educated pt on BUE strengthening HEP utilizing green  theraband in order to improve  functional performance during sit<>stands and functional transfers. Pt provided with verbal instructions, visual demonstration, and handout. Pt verbalized understanding and returned demonstration.  Exercises completed:  - shoulder, BUE, seated, horizontal abduction/adduction, IR/er, abduction/adduction, scaption; green band, 10X. - elbow, BUE, seated, bicep curl, cross body curl, elbow extension, green band, 10X.  Pt independent with self-feeding while seated on EOB during lunch.   Therapy Documentation Precautions:  Precautions Precautions: Fall Restrictions Weight Bearing Restrictions: Yes LLE Weight Bearing: Touchdown weight bearing  05/17/22 1328  Cognition  Overall Cognitive Status Within Functional Limits for tasks assessed  Arousal/Alertness Awake/alert  Orientation Level Person;Place;Situation  Problem Solving Impaired  Self Correcting Impaired  Safety/Judgment Impaired  Comments Slightly impulsive  [place] [employer] Scales of Cognitive Functioning VIII  Brief Interview for Mental Status (BIMS)  Repetition of Three Words (First Attempt) 3  Temporal Orientation: Year Correct  Temporal Orientation: Month Accurate within 5 days  Temporal Orientation: Day Correct  Recall: "Sock" Yes, no cue required  Recall: "Blue" Yes, no cue required  Recall: "Bed" Yes, no cue required  BIMS Summary Score 15    Therapy/Group: Individual Therapy  Limmie Patricia, OTR/L,CBIS  Supplemental OT - [facility]  05/17/2022, 7:54 AM

## 2022-05-17 NOTE — Care Plan (Signed)
Behavioral Plan   Rancho Level: VIII  Behavior to decrease/ eliminate:  Slight impulsivity   Changes to environment:  -Lights on, blinds open during the day; off and closed at night  Interventions: Seat belt alarm when left in WC Bed alarm, call bell in reach.  Recommendations for interactions with patient: Gentle encouragement of participation Provide emotional support   Attendees:  Malachi Pro, PT, DPT Feliberto Gottron, CC-SLP, Vedia Pereyra, RN

## 2022-05-17 NOTE — Progress Notes (Addendum)
Patient ID: Karen Curry, female   DOB: 29-Aug-1995, 26 y.o.   MRN: [id number]  SW met with pt and pt mother in room to discuss questions related to discharge. SW addressed bus vouchers for transportation, and informed she can use her transportation through insurance, and to call number that will be listed in d/c instructions. Other questions related to income were deferred to social services. She is aware SW will follow-up about d/c tomorrow. SW confirms DME delivered to room.   *SW received confirmation from attending pt can d/c to home tomorrow.   SW faxed outpatient PT/SLP referral to [facility] (p:[phone]/f:[phone]).  SW updated pt and pt mother (pt mother was sleeping at time of  visit).  Loralee Pacas, MSW, [place] Office: [phone] Cell: [phone] Fax: [phone]

## 2022-05-17 NOTE — Progress Notes (Signed)
Inpatient Rehabilitation Discharge Medication Review by a Pharmacist  A complete drug regimen review was completed for this patient to identify any potential clinically significant medication issues.  High Risk Drug Classes Is patient taking? Indication by Medication  Antipsychotic No   Anticoagulant Yes Apixaban - hx DVT  Antibiotic No   Opioid Yes Oxycodone - pain  Antiplatelet No   Hypoglycemics/insulin No   Vasoactive Medication No   Chemotherapy No   Other Yes Vitamin D - supplement     Type of Medication Issue Identified Description of Issue Recommendation(s)  Drug Interaction(s) (clinically significant)     Duplicate Therapy     Allergy     No Medication Administration End Date     Incorrect Dose     Additional Drug Therapy Needed     Significant med changes from prior encounter (inform family/care partners about these prior to discharge).    Other       Clinically significant medication issues were identified that warrant physician communication and completion of prescribed/recommended actions by midnight of the next day:  No  Time spent performing this drug regimen review (minutes):  15  Rockwell Alexandria, PharmD, [facility] PGY1 Pharmacy Resident 05/17/2022 12:35 PM

## 2022-05-17 NOTE — Progress Notes (Signed)
Physical Therapy Discharge Summary  Patient Details  Name: Karen Curry MRN: [id number] Date of Birth: 08/24/1995  Date of Discharge from PT service:May 17, 2022  Today's Date: 05/17/2022 PT Individual Time: 0900-0945 PT Individual Time Calculation (min): 45 min    Patient has met 8 of 9 long term goals due to improved activity tolerance, improved balance, improved postural control, and increased strength.  Patient to discharge at an ambulatory level Modified Independent.   Patient's care partner is independent to provide the necessary physical and cognitive assistance at discharge.  Reasons goals not met: Pt unable to meet gait goal of 150' due to impaired endurance. Pt able to ambulate up to 100' with standing rest breaks, which is adequate for safe discharge.  Recommendation:  Patient will benefit from ongoing skilled PT services in outpatient setting to continue to advance safe functional mobility, address ongoing impairments in strength, balance, ambulation, endurance, and minimize fall risk.  Equipment: RW  Reasons for discharge: treatment goals met and discharge from hospital  Patient/family agrees with progress made and goals achieved: Yes  Skilled therapeutic interventions: Pt received supine in bed and agrees to therapy. Reports pain "is not bad", and had recently taken pain meds. PT provides rest breaks to manage pain. Pt performs bed mobility independently. Pt's mother present for family education. PT provides summary of pt's progress with therapy and recommendations for safe mobility following discharge.  Pt performs stand step transfer to [facility] with RW at mod(I). WC transport to gym for time management. Pt completes car transfer and ramp navigation with cues for sequencing and use of RW. Following rest break, pt ambulates x100' with RW, requiring several brief standing rest breaks due to fatigue. Pt and therapist then work on problem solving stair navigation, as pt has 3  steps at home without a handrail. Pt initially completes 3 3" steps with bilateral hand rails and cues for sequencing and safe foot position. PT then demonstrates ascending steps backward with RW, and educates mother on manually providing stability to RW. Pt then completes x3 steps with mom holding RW. WC transport back to room. Pt left seated with alarm intat anda ll needs within reach.  PT Discharge Precautions/Restrictions Precautions Precautions: Fall Restrictions Weight Bearing Restrictions: Yes LLE Weight Bearing: Touchdown weight bearing Vital Signs Therapy Vitals Temp: (!) 97.5 F (36.4 C) Pulse Rate: 89 Resp: 15 BP: 118/75 Patient Position (if appropriate): Lying Oxygen Therapy SpO2: 99 % O2 Device: Room Air Pain Interference Pain Interference Pain Effect on Sleep: 2. Occasionally Pain Interference with Therapy Activities: 1. Rarely or not at all Pain Interference with Day-to-Day Activities: 1. Rarely or not at all Vision/Perception  Vision - History Ability to See in Adequate Light: 0 Adequate Perception Perception: Within Functional Limits Praxis Praxis: Intact  Cognition Overall Cognitive Status: Within Functional Limits for tasks assessed Arousal/Alertness: Awake/alert Orientation Level: Oriented X4 Problem Solving: Impaired Self Correcting: Impaired Safety/Judgment: Impaired Comments: Slightly impulsive [employer] Scales of Cognitive Functioning: Purposeful, Appropriate Sensation Sensation Light Touch: Appears Intact Coordination Gross Motor Movements are Fluid and Coordinated: Yes Fine Motor Movements are Fluid and Coordinated: Yes Finger Nose Finger Test: Intact Motor  Motor Motor: Within Functional Limits  Mobility Bed Mobility Bed Mobility: Supine to Sit;Sit to Supine Supine to Sit: Independent Sit to Supine: Independent Transfers Transfers: Sit to Stand;Stand to [employer] Transfers Sit to Stand: Independent with assistive  device Stand to Sit: Independent with assistive device Stand Pivot Transfers: Independent with assistive device Transfer (Assistive device):  Rolling walker Locomotion  Gait Ambulation: Yes Gait Assistance: Independent with assistive device Gait Distance (Feet): 100 Feet Assistive device: Rolling walker Gait Gait: Yes Gait Pattern: Impaired Gait Pattern: Step-to pattern Stairs / Additional Locomotion Stairs: Yes Stairs Assistance: Supervision/Verbal cueing Stair Management Technique: With walker Number of Stairs: 3 Height of Stairs: 3 Ramp: Supervision/Verbal cueing  Trunk/Postural Assessment  Cervical Assessment Cervical Assessment: Within Functional Limits Thoracic Assessment Thoracic Assessment: Within Functional Limits Lumbar Assessment Lumbar Assessment: Within Functional Limits Postural Control Postural Control: Within Functional Limits  Balance Balance Balance Assessed: Yes Static Sitting Balance Static Sitting - Balance Support: No upper extremity supported;Feet unsupported Static Sitting - Level of Assistance: 7: Independent Dynamic Sitting Balance Dynamic Sitting - Balance Support: Feet supported;During functional activity;No upper extremity supported Dynamic Sitting - Level of Assistance: 7: Independent Static Standing Balance Static Standing - Balance Support: Right upper extremity supported;Bilateral upper extremity supported Static Standing - Level of Assistance: 6: Modified independent (Device/Increase time) Dynamic Standing Balance Dynamic Standing - Balance Support: During functional activity;Bilateral upper extremity supported Dynamic Standing - Level of Assistance: 6: Modified independent (Device/Increase time) Extremity Assessment  RLE Assessment RLE Assessment: Within Functional Limits General Strength Comments: Endurance impaired LLE Assessment LLE Assessment: Exceptions to [facility] General Strength Comments: Not tested due to TDWB  precautions   Breck Coons, PT, DPT 05/17/2022, 4:46 PM

## 2022-05-17 NOTE — Plan of Care (Signed)
Problem: RH Swallowing Goal: LTG Patient will consume least restrictive diet using compensatory strategies with assistance (SLP) Description: LTG:  Patient will consume least restrictive diet using compensatory strategies with assistance (SLP) Outcome: Completed/Met Goal: LTG Patient will participate in dysphagia therapy to increase swallow function with assistance (SLP) Description: LTG:  Patient will participate in dysphagia therapy to increase swallow function with assistance (SLP) Outcome: Completed/Met Goal: LTG Pt will demonstrate functional change in swallow as evidenced by bedside/clinical objective assessment (SLP) Description: LTG: Patient will demonstrate functional change in swallow as evidenced by bedside/clinical objective assessment (SLP) Outcome: Completed/Met   Problem: RH Cognition - SLP Goal: RH LTG Patient will demonstrate orientation with cues Description:  LTG:  Patient will demonstrate orientation to person/place/time/situation with cues (SLP)   Outcome: Completed/Met   Problem: RH Comprehension Communication Goal: LTG Patient will comprehend basic/complex auditory (SLP) Description: LTG: Patient will comprehend basic/complex auditory information with cues (SLP). Outcome: Completed/Met   Problem: RH Expression Communication Goal: LTG Patient will express needs/wants via multi-modal(SLP) Description: LTG:  Patient will express needs/wants via multi-modal communication (gestures/written, etc) with cues (SLP) Outcome: Completed/Met Goal: LTG Patient will verbally express basic/complex needs(SLP) Description: LTG:  Patient will verbally express basic/complex needs, wants or ideas with cues  (SLP) Outcome: Completed/Met Goal: LTG Patient will increase speech intelligibility (SLP) Description: LTG: Patient will increase speech intelligibility at word/phrase/conversation level with cues, % of the time (SLP) Outcome: Completed/Met Goal: LTG Patient will increase word  finding of common (SLP) Description: LTG:  Patient will increase word finding of common objects/daily info/abstract thoughts with cues using compensatory strategies (SLP). Outcome: Completed/Met   Problem: RH Problem Solving Goal: LTG Patient will demonstrate problem solving for (SLP) Description: LTG:  Patient will demonstrate problem solving for basic/complex daily situations with cues  (SLP) Outcome: Completed/Met   Problem: RH Memory Goal: LTG Patient will follow step by step directions w/cues (SLP) Description: LTG: Patient will follow step by step directions with cues (SLP). Outcome: Completed/Met Goal: LTG Patient will demonstrate ability for day to day (SLP) Description: LTG:   Patient will demonstrate ability for day to day recall/carryover during cognitive/linguistic activities with assist  (SLP) Outcome: Completed/Met Goal: LTG Patient will use memory compensatory aids to (SLP) Description: LTG:  Patient will use memory compensatory aids to recall biographical/new, daily complex information with cues (SLP) Outcome: Completed/Met   Problem: RH Attention Goal: LTG Patient will demonstrate this level of attention during functional activites (SLP) Description: LTG:  Patient will will demonstrate this level of attention during functional activites (SLP) Outcome: Completed/Met   Problem: RH Awareness Goal: LTG: Patient will demonstrate awareness during functional activites type of (SLP) Description: LTG: Patient will demonstrate awareness during functional activites type of (SLP) Outcome: Completed/Met   Problem: RH Expression Communication Goal: LTG Patient will verbally express basic/complex needs(SLP) Description: LTG:  Patient will verbally express basic/complex needs, wants or ideas with cues  (SLP) Outcome: Completed/Met   Problem: RH Problem Solving Goal: LTG Patient will demonstrate problem solving for (SLP) Description: LTG:  Patient will demonstrate problem solving  for basic/complex daily situations with cues  (SLP) Outcome: Completed/Met   Problem: RH Memory Goal: LTG Patient will demonstrate ability for day to day (SLP) Description: LTG:   Patient will demonstrate ability for day to day recall/carryover during cognitive/linguistic activities with assist  (SLP) Outcome: Completed/Met Goal: LTG Patient will use memory compensatory aids to (SLP) Description: LTG:  Patient will use memory compensatory aids to recall biographical/new, daily complex information with cues (  SLP) Outcome: Completed/Met

## 2022-05-17 NOTE — Progress Notes (Signed)
Closed out nursing education and nursing care plan for discharge to home 05/18/22

## 2022-05-17 NOTE — Progress Notes (Signed)
Attempted to met patient but was with therapy.
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
## 2022-05-17 NOTE — Plan of Care (Addendum)
  Problem: RH Furniture Transfers Goal: LTG Patient will perform furniture transfers w/assist (OT/PT) Description: LTG: Patient will perform furniture transfers  with assistance (OT/PT). 05/17/2022 2325 by Valma Cava, OT Outcome: Completed/Met 05/17/2022 2322 by Valma Cava, OT Reactivated 05/17/2022 2321 by Valma Cava, OT Outcome: Completed/Met   Problem: RH Awareness Goal: LTG: Patient will demonstrate awareness during functional activites type of (OT) Description: LTG: Patient will demonstrate awareness during functional activites type of (OT) 05/17/2022 2325 by Valma Cava, OT Outcome: Completed/Met 05/17/2022 2322 by Valma Cava, OT Reactivated 05/17/2022 2321 by Valma Cava, OT Outcome: Completed/Met   Problem: RH Bathing Goal: LTG Patient will bathe all body parts with assist levels (OT) Description: LTG: Patient will bathe all body parts with assist levels (OT) 05/17/2022 2325 by Valma Cava, OT Outcome: Completed/Met 05/17/2022 2322 by Valma Cava, OT Reactivated 05/17/2022 2321 by Valma Cava, OT Outcome: Completed/Met   Problem: RH Dressing Goal: LTG Patient will perform upper body dressing (OT) Description: LTG Patient will perform upper body dressing with assist, with/without cues (OT). 05/17/2022 2325 by Valma Cava, OT Outcome: Completed/Met 05/17/2022 2322 by Valma Cava, OT Reactivated 05/17/2022 2321 by Valma Cava, OT Outcome: Completed/Met Goal: LTG Patient will perform lower body dressing w/assist (OT) Description: LTG: Patient will perform lower body dressing with assist, with/without cues in positioning using equipment (OT) 05/17/2022 2325 by Valma Cava, OT Outcome: Completed/Met 05/17/2022 2322 by Valma Cava, OT Reactivated 05/17/2022 2321 by Valma Cava, OT Outcome: Completed/Met   Problem: RH Toileting Goal: LTG Patient will perform toileting task (3/3 steps) with  assistance level (OT) Description: LTG: Patient will perform toileting task (3/3 steps) with assistance level (OT)  05/17/2022 2325 by Valma Cava, OT Outcome: Completed/Met 05/17/2022 2322 by Valma Cava, OT Reactivated 05/17/2022 2321 by Valma Cava, OT Outcome: Completed/Met   Problem: RH Simple Meal Prep Goal: LTG Patient will perform simple meal prep w/assist (OT) Description: LTG: Patient will perform simple meal prep with assistance, with/without cues (OT). 05/17/2022 2325 by Valma Cava, OT Outcome: Completed/Met 05/17/2022 2322 by Valma Cava, OT Reactivated 05/17/2022 2321 by Valma Cava, OT Outcome: Completed/Met   Problem: RH Toilet Transfers Goal: LTG Patient will perform toilet transfers w/assist (OT) Description: LTG: Patient will perform toilet transfers with assist, with/without cues using equipment (OT) 05/17/2022 2325 by Valma Cava, OT Outcome: Completed/Met 05/17/2022 2322 by Valma Cava, OT Reactivated 05/17/2022 2321 by Valma Cava, OT Outcome: Completed/Met   Problem: RH Tub/Shower Transfers Goal: LTG Patient will perform tub/shower transfers w/assist (OT) Description: LTG: Patient will perform tub/shower transfers with assist, with/without cues using equipment (OT) 05/17/2022 2325 by Valma Cava, OT Outcome: Completed/Met 05/17/2022 2322 by Valma Cava, OT Reactivated 05/17/2022 2321 by Valma Cava, OT Outcome: Completed/Met

## 2022-05-17 NOTE — Progress Notes (Signed)
Speech Language Pathology Discharge Summary  Patient Details  Name: Karen Curry MRN: [id number] Date of Birth: 1996-04-29  Date of Discharge from SLP service:May 17, 2022  Today's Date: 05/17/2022 SLP Individual Time: 1000-1045 SLP Individual Time Calculation (min): 45 min   Skilled Therapeutic Interventions:  Skilled treatment session focused on completion of family education with the patient and her mother. SLP facilitated session by providing education regarding patient's current cognitive functioning and strategies to utilize at home to maximize recall, problem solving, and overall safety with functional and familiar tasks. SLP also provided tasks/activities patient can participate in at home to stay cognitively engaged with focus on cognitive remediation. Patient was able to verbalize and utilize strategies for intermittent and subtle dysfluencies at the sentence level independently. Patient and mother verbalized understanding and all questions were answered. Patient left upright in bed with mother present.    Patient has met 3 of 3 long term goals.  Patient to discharge at overall Modified Independent level.   Reasons goals not met: N/A   Clinical Impression/Discharge Summary: Patient has made excellent gains and has met 3 of 3 LTGs this admission. Currently, patient is overall Mod I to complete functional and mildly complex tasks safely in regards to recall and problem solving. Patient is also Mod I for verbal expression at the sentence level with use of compensatory strategies for dysfluencies. Patient and family education is complete and patient will discharge home with supervision from family. Patient would benefit from f/u outpatient SLP services to maximize her cognitive functioning and functional communication in order to return to her previous level of functioning.   Care Partner:  Caregiver Able to Provide Assistance: Yes  Type of Caregiver Assistance:  Physical;Cognitive  Recommendation:  Outpatient SLP (Intermittent supervision)  Rationale for SLP Follow Up: Reduce caregiver burden;Maximize cognitive function and independence;Maximize functional communication   Equipment: N/A   Reasons for discharge: Discharged from hospital;Treatment goals met   Patient/Family Agrees with Progress Made and Goals Achieved: Yes    [place], [place] 05/17/2022, 4:16 PM
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
## 2022-05-18 DIAGNOSIS — K029 Dental caries, unspecified: Secondary | ICD-10-CM

## 2022-05-18 NOTE — Progress Notes (Signed)
Staples removed from Left LE. Pt tolerated well. Skin is clean, dry and intact.   Badr P[place]ra [place]
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
## 2022-05-19 NOTE — Progress Notes (Signed)
Review chart and an additional care plan was opened. Closed care plan
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
## 2022-05-26 ENCOUNTER — Emergency Department (HOSPITAL_COMMUNITY): Payer: Medicaid Other

## 2022-05-26 ENCOUNTER — Emergency Department (HOSPITAL_COMMUNITY)
Admission: EM | Admit: 2022-05-26 | Discharge: 2022-05-26 | Disposition: A | Discharge status: Home / Self Care | Payer: Medicaid Other | Attending: Emergency Medicine | Admitting: Emergency Medicine

## 2022-05-26 ENCOUNTER — Other Ambulatory Visit: Payer: Self-pay

## 2022-05-26 ENCOUNTER — Encounter (HOSPITAL_COMMUNITY): Payer: Self-pay

## 2022-05-26 DIAGNOSIS — S7002XA Contusion of left hip, initial encounter: Secondary | ICD-10-CM | POA: Diagnosis not present

## 2022-05-26 DIAGNOSIS — Z7901 Long term (current) use of anticoagulants: Secondary | ICD-10-CM | POA: Insufficient documentation

## 2022-05-26 DIAGNOSIS — W08XXXA Fall from other furniture, initial encounter: Secondary | ICD-10-CM | POA: Insufficient documentation

## 2022-05-26 DIAGNOSIS — Y92009 Unspecified place in unspecified non-institutional (private) residence as the place of occurrence of the external cause: Secondary | ICD-10-CM | POA: Insufficient documentation

## 2022-05-26 DIAGNOSIS — S79912A Unspecified injury of left hip, initial encounter: Secondary | ICD-10-CM | POA: Diagnosis present

## 2022-05-26 LAB — I-STAT BETA HCG BLOOD, ED (MC, WL, AP ONLY): I-stat hCG, quantitative: <5 m[IU]/mL (ref <5)

## 2022-05-26 MED ORDER — OXYCODONE HCL 5 MG PO TABS: 5.0000 mg | ORAL_TABLET | Freq: Four times a day (QID) | ORAL | 0 refills | Status: DC | PRN

## 2022-05-26 MED ORDER — HYDROMORPHONE HCL 1 MG/ML IJ SOLN
1.0000 mg | Freq: Once | INTRAMUSCULAR | Status: AC
  Administered 2022-05-26: 1 mg via INTRAVENOUS
  Filled 2022-05-26: qty 1
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
## 2022-06-01 NOTE — Therapy (Deleted)
OUTPATIENT SPEECH LANGUAGE PATHOLOGY EVALUATION   Patient Name: Karen Curry MRN: [id number] DOB:[birth year], 27 y.o., female Today's Date: 06/01/2022  PCP: none  REFERRING PROVIDER: Cathlyn Parsons, PA-C  END OF SESSION:   Past Medical History:  Diagnosis Date   Asthma    Kidney stones    27 yo   Kidney stones    2021   UTI (urinary tract infection)    Past Surgical History:  Procedure Laterality Date   DILATION AND EVACUATION  12/21/2021   Procedure: DILATATION AND EVACUATION;  Surgeon: Rowland Lathe, MD;  Location: [place];  Service: Gynecology;;   NO PAST SURGERIES     OPEN REDUCTION INTERNAL FIXATION ACETABULAR FRACTURE STOPPA Left 05/03/2022   Procedure: OPEN REDUCTION INTERNAL FIXATION ACETABULAR FRACTURE STOPPA;  Surgeon: Shona Needles, MD;  Location: [place];  Service: Orthopedics;  Laterality: Left;   OPERATIVE ULTRASOUND  12/21/2021   Procedure: OPERATIVE ULTRASOUND;  Surgeon: Rowland Lathe, MD;  Location: [place];  Service: Gynecology;;   Patient Active Problem List   Diagnosis Date Noted   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[place]) 05/14/2022   TBI (traumatic brain injury) ([place]) 05/10/2022   Multiple closed fractures of pelvis ([place]) 05/10/2022   Acute blood loss anemia 05/10/2022   Left acetabular fracture ([place]) 05/01/2022   Spontaneous vaginal delivery 04/27/2019   Normal labor 04/25/2019   Acute right flank pain 02/20/2019   UTI (urinary tract infection) 11/06/2018   Supervision of low-risk pregnancy 10/17/2018   History of low birth weight 10/17/2018   Acanthosis 06/16/2015   Hirsutism 06/16/2015   Obesity (BMI 30-39.9) 06/16/2015    ONSET DATE: 05-01-2022   REFERRING DIAG: V03.9XAA (ICD-10-CM) - Unspecified intracranial injury with loss of consciousness status unknown, initial encounter   THERAPY DIAG:  No diagnosis found.  Rationale for Evaluation and Treatment: Rehabilitation  SUBJECTIVE:   SUBJECTIVE  STATEMENT: *** Pt accompanied by: {accompnied:27141}  PERTINENT HISTORY: ***  PAIN:  Are you having pain? {OPRCPAIN:27236}  FALLS: Has patient fallen in last 6 months?  {JKKXFGHW:29937}  LIVING ENVIRONMENT: Lives with: {OPRC lives with:25569::"lives with their family"} Lives in: {Lives in:25570}  PLOF:  Level of assistance: {JIRCVEL:38101} Employment: {SLPemployment:25674}  PATIENT GOALS: ***  OBJECTIVE:   DIAGNOSTIC FINDINGS: ***  COGNITION: Overall cognitive status: {cognition:24006} Areas of impairment:  {cognitiveimpairmentslp:27409} Functional deficits: ***  COGNITIVE COMMUNICATION: Following directions: {commands:24018}  Auditory comprehension: {WFL-Impaired:25365} Verbal expression: {WFL-Impaired:25365} Functional communication: {WFL-Impaired:25365}  ORAL MOTOR EXAMINATION: Overall status: {OMESLP2:27645} Comments: ***  STANDARDIZED ASSESSMENTS: {SLPstandardizedassessment:27092}  PATIENT REPORTED OUTCOME MEASURES (PROM): {SLPPROM:27095}   TODAY'S TREATMENT:                                                                                                                                         DATE: ***   PATIENT EDUCATION: Education details: *** Person educated: {Person educated:25204} Education method: {Education Method:25205} Education comprehension: {  Education Comprehension:25206}   GOALS: Goals reviewed with patient? {yes/no:20286}  SHORT TERM GOALS: Target date: ***  *** Baseline: Goal status: {GOALSTATUS:25110}  2.  *** Baseline:  Goal status: {GOALSTATUS:25110}  3.  *** Baseline:  Goal status: {GOALSTATUS:25110}  4.  *** Baseline:  Goal status: {GOALSTATUS:25110}  5.  *** Baseline:  Goal status: {GOALSTATUS:25110}  6.  *** Baseline:  Goal status: {GOALSTATUS:25110}  LONG TERM GOALS: Target date: ***  *** Baseline:  Goal status: {GOALSTATUS:25110}  2.  *** Baseline:  Goal status: {GOALSTATUS:25110}  3.   *** Baseline:  Goal status: {GOALSTATUS:25110}  4.  *** Baseline:  Goal status: {GOALSTATUS:25110}  5.  *** Baseline:  Goal status: {GOALSTATUS:25110}  6.  *** Baseline:  Goal status: {GOALSTATUS:25110}  ASSESSMENT:  CLINICAL IMPRESSION: Patient is a *** y.o. *** who was seen today for ***.   OBJECTIVE IMPAIRMENTS: include {SLPOBJIMP:27107}. These impairments are limiting patient from {SLPLIMIT:27108}. Factors affecting potential to achieve goals and functional outcome are {SLP factors:25450}.. Patient will benefit from skilled SLP services to address above impairments and improve overall function.  REHAB POTENTIAL: {rehabpotential:25112}  PLAN:  SLP FREQUENCY: {rehab frequency:25116}  SLP DURATION: {rehab duration:25117}  PLANNED INTERVENTIONS: {SLP treatment/interventions:25449}    Su Monks, CCC-SLP 06/01/2022, 4:38 PM

## 2022-06-02 ENCOUNTER — Encounter: Payer: Medicaid Other | Admitting: Speech Pathology

## 2022-06-02 ENCOUNTER — Ambulatory Visit: Payer: Medicaid Other

## 2022-06-07 ENCOUNTER — Ambulatory Visit: Payer: No Typology Code available for payment source | Admitting: Physical Therapy

## 2022-06-07 ENCOUNTER — Ambulatory Visit: Payer: Medicaid Other | Attending: Physician Assistant | Admitting: Speech Pathology

## 2022-06-07 ENCOUNTER — Ambulatory Visit: Payer: Medicaid Other | Admitting: Physical Therapy

## 2022-06-07 ENCOUNTER — Encounter: Payer: Self-pay | Admitting: Speech Pathology

## 2022-06-07 ENCOUNTER — Other Ambulatory Visit: Payer: Self-pay

## 2022-06-07 ENCOUNTER — Encounter: Payer: Self-pay | Admitting: Physical Therapy

## 2022-06-07 DIAGNOSIS — M25552 Pain in left hip: Secondary | ICD-10-CM

## 2022-06-07 DIAGNOSIS — R269 Unspecified abnormalities of gait and mobility: Secondary | ICD-10-CM | POA: Insufficient documentation

## 2022-06-07 DIAGNOSIS — M545 Low back pain, unspecified: Secondary | ICD-10-CM | POA: Diagnosis present

## 2022-06-07 DIAGNOSIS — R41841 Cognitive communication deficit: Secondary | ICD-10-CM | POA: Diagnosis present
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
## 2022-06-08 ENCOUNTER — Inpatient Hospital Stay: Payer: No Typology Code available for payment source | Admitting: Registered Nurse

## 2022-06-14 ENCOUNTER — Encounter: Payer: Self-pay | Admitting: Registered Nurse

## 2022-06-14 ENCOUNTER — Encounter: Payer: Medicaid Other | Attending: Registered Nurse | Admitting: Registered Nurse

## 2022-06-14 VITALS — BP 114/74 | HR 84 | Ht 63.0 in | Wt 202.0 lb

## 2022-06-14 DIAGNOSIS — S3282XD Multiple fractures of pelvis without disruption of pelvic ring, subsequent encounter for fracture with routine healing: Secondary | ICD-10-CM | POA: Diagnosis present

## 2022-06-14 MED ORDER — APIXABAN 5 MG PO TABS: 5.0000 mg | ORAL_TABLET | Freq: Two times a day (BID) | ORAL | 0 refills | Status: DC

## 2022-06-14 NOTE — Patient Instructions (Addendum)
Call Dr. Doreatha Martin office to schedule hospital follow up appointment  [place], [place] [address] [facility]:  Need a Primary Care Physician  [place] [phone]
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
## 2022-06-22 ENCOUNTER — Encounter: Payer: Self-pay | Admitting: Registered Nurse

## 2022-06-25 ENCOUNTER — Ambulatory Visit: Payer: Medicaid Other | Admitting: Family Medicine

## 2022-06-25 ENCOUNTER — Encounter: Payer: Self-pay | Admitting: Family Medicine

## 2022-06-25 VITALS — BP 118/74 | HR 83 | Temp 97.5°F | Ht 63.0 in | Wt 201.4 lb

## 2022-06-25 DIAGNOSIS — I82452 Acute embolism and thrombosis of left peroneal vein: Secondary | ICD-10-CM | POA: Insufficient documentation

## 2022-06-25 DIAGNOSIS — D62 Acute posthemorrhagic anemia: Secondary | ICD-10-CM | POA: Diagnosis not present

## 2022-06-25 LAB — BASIC METABOLIC PANEL
BUN: 10 mg/dL (ref 6–23)
CO2: 27 mEq/L (ref 19–32)
Calcium: 9.3 mg/dL (ref 8.4–10.5)
Chloride: 102 mEq/L (ref 96–112)
Creatinine, Ser: 0.65 mg/dL (ref 0.40–1.20)
GFR: 120.3 mL/min (ref >60.00)
Glucose, Bld: 82 mg/dL (ref 70–99)
Potassium: 3.6 mEq/L (ref 3.5–5.1)
Sodium: 137 mEq/L (ref 135–145)

## 2022-06-25 LAB — URINALYSIS, ROUTINE W REFLEX MICROSCOPIC
Bilirubin Urine: NEGATIVE
Hgb urine dipstick: NEGATIVE
Ketones, ur: NEGATIVE
Leukocytes,Ua: NEGATIVE
Nitrite: NEGATIVE
Specific Gravity, Urine: 1.02 (ref 1.000–1.030)
Total Protein, Urine: NEGATIVE
Urine Glucose: NEGATIVE
Urobilinogen, UA: 0.2 (ref 0.0–1.0)
pH: 7 (ref 5.0–8.0)

## 2022-06-25 LAB — CBC
HCT: 35.9 % — ABNORMAL LOW (ref 36.0–46.0)
Hemoglobin: 11.9 g/dL — ABNORMAL LOW (ref 12.0–15.0)
MCHC: 33.2 g/dL (ref 30.0–36.0)
MCV: 84.8 fl (ref 78.0–100.0)
Platelets: 283 10*3/uL (ref 150.0–400.0)
RBC: 4.24 Mil/uL (ref 3.87–5.11)
RDW: 13.6 % (ref 11.5–15.5)
WBC: 5.2 10*3/uL (ref 4.0–10.5)
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
## 2022-06-26 LAB — IRON,TIBC AND FERRITIN PANEL
%SAT: 14 % (calc) — ABNORMAL LOW (ref 16–45)
Ferritin: 40 ng/mL (ref 16–154)
Iron: 41 ug/dL (ref 40–190)
TIBC: 301 mcg/dL (calc) (ref 250–450)

## 2022-06-29 ENCOUNTER — Other Ambulatory Visit: Payer: Self-pay

## 2022-06-29 MED ORDER — FERROUS SULFATE 325 (65 FE) MG PO TABS: 324.0000 mg | ORAL_TABLET | Freq: Every day | ORAL | 0 refills | Status: DC

## 2022-06-29 NOTE — Telephone Encounter (Signed)
Called patient went over labs and recommendations. Per patient she needs a refill on iron pills okay to send in refills?

## 2022-07-05 ENCOUNTER — Ambulatory Visit: Payer: Medicaid Other | Attending: Physician Assistant | Admitting: Physical Therapy

## 2022-07-05 ENCOUNTER — Encounter: Payer: Self-pay | Admitting: Physical Therapy

## 2022-07-05 VITALS — BP 103/78 | HR 86

## 2022-07-05 DIAGNOSIS — M25552 Pain in left hip: Secondary | ICD-10-CM | POA: Insufficient documentation

## 2022-07-05 DIAGNOSIS — R269 Unspecified abnormalities of gait and mobility: Secondary | ICD-10-CM | POA: Insufficient documentation

## 2022-07-05 DIAGNOSIS — M545 Low back pain, unspecified: Secondary | ICD-10-CM | POA: Diagnosis present

## 2022-07-05 DIAGNOSIS — S069X0A Unspecified intracranial injury without loss of consciousness, initial encounter: Secondary | ICD-10-CM | POA: Diagnosis present

## 2022-07-05 DIAGNOSIS — R41841 Cognitive communication deficit: Secondary | ICD-10-CM | POA: Diagnosis not present
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
## 2022-07-15 ENCOUNTER — Other Ambulatory Visit: Payer: Self-pay | Admitting: Registered Nurse

## 2022-07-16 ENCOUNTER — Encounter: Payer: Self-pay | Admitting: Physical Therapy

## 2022-07-16 ENCOUNTER — Ambulatory Visit: Payer: Medicaid Other | Admitting: Physical Therapy

## 2022-07-16 DIAGNOSIS — R41841 Cognitive communication deficit: Secondary | ICD-10-CM

## 2022-07-16 DIAGNOSIS — M545 Low back pain, unspecified: Secondary | ICD-10-CM

## 2022-07-16 DIAGNOSIS — M25552 Pain in left hip: Secondary | ICD-10-CM

## 2022-07-16 DIAGNOSIS — R269 Unspecified abnormalities of gait and mobility: Secondary | ICD-10-CM

## 2022-07-16 NOTE — Therapy (Signed)
[facility] [address] [place], [place], [address] Phone: [phone]   Fax:  [phone]  Patient Details  Name: Karen Curry MRN: [id number] Date of Birth: 02/15/1996 Referring Provider:  Cathlyn Parsons, PA-C  Encounter Date: 07/16/2022  Session was arrive no charge. This therapist had called on 07/15/2022 office handling orthopedic trauma/weightbearing precautions to clarify current WBS. A few minutes before seeing patient received VM back from group, stating patient had been regressed back to TTWB for 1 week and then would progress from there. Unable to find this information in the chart and inconsistent with patient's subjective report. Will call back on Monday when office is open to clarify. Session ended at this time but advised patient to do TTWW as last informed. As patient has appropriate HEP for TTWB, did not provide additional care planning to save visits once able to progress gait. Provided tennis balls to 2WW to improve ease with mobility.   Esperanza Heir, PT, DPT 07/16/2022, 11:05 AM  [facility] [address] [place], [place], [address] Phone: [phone]   Fax:  [phone]

## 2022-07-19 ENCOUNTER — Telehealth: Payer: Self-pay | Admitting: Physical Therapy

## 2022-07-19 NOTE — Telephone Encounter (Signed)
Called orthopedic trauma center that has been giving [name] post op instructions on weight bearing. Spoke with Melissa on phone and clarified that patient was to regress back to toe touch weight bearing for 1 week as of Friday's visit and as long as tolerated well could progress weight bearing as tolerated as long as not experiencing "significant pain." Requested that script be faxed over.  Malachi Carl, PT, DPT

## 2022-07-21 ENCOUNTER — Telehealth: Payer: Self-pay | Admitting: Family Medicine

## 2022-07-21 NOTE — Telephone Encounter (Signed)
Returned patients call no answer VM full unable to LM will call back.

## 2022-07-21 NOTE — Telephone Encounter (Signed)
Caller Name: Judea Call back phone #: [phone]   Reason for Call: Pt has pain and swelling in her jaw tooth. She wants to have her wisdom teeth removed and needs to know what needs to be done as far as her blood thinner and other meds. Please call

## 2022-07-23 ENCOUNTER — Telehealth: Payer: Self-pay

## 2022-07-23 ENCOUNTER — Ambulatory Visit: Payer: Medicaid Other | Admitting: Physical Therapy

## 2022-07-23 DIAGNOSIS — S069X0A Unspecified intracranial injury without loss of consciousness, initial encounter: Secondary | ICD-10-CM

## 2022-07-23 DIAGNOSIS — M25552 Pain in left hip: Secondary | ICD-10-CM

## 2022-07-23 DIAGNOSIS — R269 Unspecified abnormalities of gait and mobility: Secondary | ICD-10-CM | POA: Diagnosis not present

## 2022-07-23 DIAGNOSIS — M545 Low back pain, unspecified: Secondary | ICD-10-CM

## 2022-07-23 NOTE — Telephone Encounter (Signed)
Can we have patients primary number switched to mobile number. Current primary number is mothers work number and patient can not be reached at this number.
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
## 2022-07-23 NOTE — Telephone Encounter (Signed)
Spoke with patient who states that the tooth have gotten better but she will need to have this tooth removed. Patient would like to know how will she go about being off blood thinner prior to tooth being removed?

## 2022-07-26 NOTE — Telephone Encounter (Signed)
Please advise messages below

## 2022-07-27 NOTE — Telephone Encounter (Signed)
Called patient to go over recommendations prior to having tooth abstracted. No answer unable to LM will call back.

## 2022-07-28 NOTE — Telephone Encounter (Signed)
Tried calling patient to inform of message below phone out of service. Called other number no answer [id number]

## 2022-07-30 ENCOUNTER — Ambulatory Visit: Payer: Medicaid Other | Attending: Physician Assistant | Admitting: Physical Therapy

## 2022-07-30 DIAGNOSIS — R269 Unspecified abnormalities of gait and mobility: Secondary | ICD-10-CM | POA: Insufficient documentation

## 2022-07-30 DIAGNOSIS — M6281 Muscle weakness (generalized): Secondary | ICD-10-CM | POA: Insufficient documentation

## 2022-07-30 DIAGNOSIS — M25552 Pain in left hip: Secondary | ICD-10-CM | POA: Insufficient documentation

## 2022-07-30 DIAGNOSIS — M545 Low back pain, unspecified: Secondary | ICD-10-CM | POA: Insufficient documentation

## 2022-08-02 NOTE — Telephone Encounter (Signed)
Spoke with patient who verbally understood message below.

## 2022-08-06 ENCOUNTER — Ambulatory Visit: Payer: Medicaid Other | Admitting: Physical Therapy

## 2022-08-13 ENCOUNTER — Ambulatory Visit: Payer: Medicaid Other | Admitting: Physical Therapy

## 2022-08-13 ENCOUNTER — Encounter: Payer: Self-pay | Admitting: Physical Therapy

## 2022-08-13 NOTE — Therapy (Signed)
[facility] [address] [place], [place], [address] Phone: [phone]   Fax:  [phone]  Patient Details  Name: Karen Curry MRN: [id number] Date of Birth: 02-Jan-1996 Referring Provider:  No ref. provider found  Encounter Date: 08/13/2022   Patient discharged from PT services after 3 no call/no show appointments. Attempted to reach patient via primary phone number listed in chart, number disconnected. Patient will need a new referral to resume therapy services.  Excell Seltzer, PT, DPT, CSRS 08/13/2022, 9:50 AM  [facility] [address] [place], [place], [address] Phone: [phone]   Fax:  [phone]

## 2022-08-20 ENCOUNTER — Ambulatory Visit: Payer: Medicaid Other | Admitting: Physical Therapy

## 2022-08-20 ENCOUNTER — Encounter: Payer: Self-pay | Admitting: Physical Therapy

## 2022-08-20 VITALS — BP 125/87 | HR 70

## 2022-08-20 DIAGNOSIS — M6281 Muscle weakness (generalized): Secondary | ICD-10-CM

## 2022-08-20 DIAGNOSIS — M25552 Pain in left hip: Secondary | ICD-10-CM | POA: Diagnosis present

## 2022-08-20 DIAGNOSIS — R269 Unspecified abnormalities of gait and mobility: Secondary | ICD-10-CM

## 2022-08-20 DIAGNOSIS — M545 Low back pain, unspecified: Secondary | ICD-10-CM | POA: Diagnosis present

## 2022-08-20 NOTE — Addendum Note (Signed)
Addended by: Malachi Carl A on: 08/20/2022 04:58 PM   Modules accepted: Orders
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
## 2022-08-25 ENCOUNTER — Ambulatory Visit (INDEPENDENT_AMBULATORY_CARE_PROVIDER_SITE_OTHER): Payer: Medicaid Other | Admitting: Family Medicine

## 2022-08-25 ENCOUNTER — Encounter: Payer: Self-pay | Admitting: Family Medicine

## 2022-08-25 VITALS — BP 110/70 | HR 72 | Temp 98.0°F | Ht 63.0 in | Wt 218.0 lb

## 2022-08-25 DIAGNOSIS — D508 Other iron deficiency anemias: Secondary | ICD-10-CM

## 2022-08-25 DIAGNOSIS — I82452 Acute embolism and thrombosis of left peroneal vein: Secondary | ICD-10-CM | POA: Diagnosis not present

## 2022-08-25 LAB — CBC
HCT: 38.3 % (ref 36.0–46.0)
Hemoglobin: 12.4 g/dL (ref 12.0–15.0)
MCHC: 32.3 g/dL (ref 30.0–36.0)
MCV: 82.1 fl (ref 78.0–100.0)
Platelets: 281 10*3/uL (ref 150.0–400.0)
RBC: 4.66 Mil/uL (ref 3.87–5.11)
RDW: 14.6 % (ref 11.5–15.5)
WBC: 5.1 10*3/uL (ref 4.0–10.5)

## 2022-08-25 NOTE — Progress Notes (Signed)
Established Patient Office Visit   Subjective:  Patient ID: Karen Curry, female    DOB: Apr 08, 1996  Age: 27 y.o. MRN: [id number]  Chief Complaint  Patient presents with   Medical Management of Chronic Issues    2 month follow up discuss clots in legs. Patient fasting.     HPI Encounter Diagnoses  Name Primary?   Acute deep vein thrombosis (DVT) of left peroneal vein (HCC) Yes   Other iron deficiency anemia    For follow-up of above.  Upon finishing your current course of Eliquis she will completed her 77-month assignment status post trauma provoked DVT.  She has no personal or family history of DVT.  She continues to work with orthopedics.  There is some lingering pain around the fracture site but denies pain deep in her leg.  She has no issues taking the Eliquis and denies blood in her stool or urine.   Review of Systems  Constitutional: Negative.   HENT: Negative.    Eyes:  Negative for blurred vision, discharge and redness.  Respiratory: Negative.    Cardiovascular: Negative.   Gastrointestinal:  Negative for abdominal pain.  Genitourinary: Negative.   Musculoskeletal: Negative.  Negative for myalgias.  Skin:  Negative for rash.  Neurological:  Negative for tingling, loss of consciousness and weakness.  Endo/Heme/Allergies:  Negative for polydipsia.     Current Outpatient Medications:    apixaban (ELIQUIS) 5 MG TABS tablet, Take 1 tablet (5 mg total) by mouth 2 (two) times daily., Disp: 60 tablet, Rfl: 0   ferrous sulfate 325 (65 FE) MG tablet, Take 1 tablet (324 mg total) by mouth daily with breakfast., Disp: 90 tablet, Rfl: 0   Objective:     BP 110/70   Pulse 72   Temp 98 F (36.7 C) (Temporal)   Ht 5\' 3"  (1.6 m)   Wt 218 lb (98.9 kg)   LMP 08/04/2022   SpO2 100%   BMI 38.62 kg/m    Physical Exam Constitutional:      General: She is not in acute distress.    Appearance: Normal appearance. She is not ill-appearing, toxic-appearing or diaphoretic.   HENT:     Head: Normocephalic and atraumatic.     Right Ear: External ear normal.     Left Ear: External ear normal.  Eyes:     General: No scleral icterus.       Right eye: No discharge.        Left eye: No discharge.     Extraocular Movements: Extraocular movements intact.     Conjunctiva/sclera: Conjunctivae normal.  Pulmonary:     Effort: Pulmonary effort is normal. No respiratory distress.  Skin:    General: Skin is warm and dry.  Neurological:     Mental Status: She is alert and oriented to person, place, and time.  Psychiatric:        Mood and Affect: Mood normal.        Behavior: Behavior normal.      No results found for any visits on 08/25/22.    The ASCVD Risk score (Arnett DK, et al., 2019) failed to calculate for the following reasons:   The 2019 ASCVD risk score is only valid for ages 54 to 63    Assessment & Plan:   Acute deep vein thrombosis (DVT) of left peroneal vein (HCC) -     US Venous Img Lower Unilateral Left (DVT); Future  Other iron deficiency anemia -     CBC -  Iron, TIBC and Ferritin Panel    Return in about 3 months (around 11/25/2022), or Complete current course of Eliquis and then stop.Libby Maw, MD

## 2022-08-26 LAB — IRON,TIBC AND FERRITIN PANEL
%SAT: 20 % (calc) (ref 16–45)
Ferritin: 24 ng/mL (ref 16–154)
Iron: 67 ug/dL (ref 40–190)
TIBC: 340 mcg/dL (calc) (ref 250–450)

## 2022-09-01 ENCOUNTER — Encounter: Payer: Medicaid Other | Attending: Registered Nurse | Admitting: Physical Medicine & Rehabilitation

## 2022-09-01 DIAGNOSIS — S3282XD Multiple fractures of pelvis without disruption of pelvic ring, subsequent encounter for fracture with routine healing: Secondary | ICD-10-CM | POA: Insufficient documentation

## 2022-09-02 ENCOUNTER — Ambulatory Visit: Payer: Medicaid Other | Attending: Physician Assistant | Admitting: Physical Therapy

## 2022-09-02 DIAGNOSIS — M545 Low back pain, unspecified: Secondary | ICD-10-CM | POA: Insufficient documentation

## 2022-09-02 DIAGNOSIS — M25552 Pain in left hip: Secondary | ICD-10-CM

## 2022-09-02 DIAGNOSIS — M6281 Muscle weakness (generalized): Secondary | ICD-10-CM | POA: Diagnosis present

## 2022-09-02 DIAGNOSIS — R269 Unspecified abnormalities of gait and mobility: Secondary | ICD-10-CM | POA: Insufficient documentation

## 2022-09-02 DIAGNOSIS — S069X0A Unspecified intracranial injury without loss of consciousness, initial encounter: Secondary | ICD-10-CM | POA: Insufficient documentation
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
## 2022-09-03 ENCOUNTER — Ambulatory Visit: Payer: Self-pay | Admitting: Family Medicine

## 2022-09-03 ENCOUNTER — Ambulatory Visit: Payer: Medicaid Other | Admitting: Physical Therapy

## 2022-09-07 ENCOUNTER — Encounter: Payer: Self-pay | Admitting: Physical Therapy

## 2022-09-07 ENCOUNTER — Ambulatory Visit: Payer: Medicaid Other | Admitting: Physical Therapy

## 2022-09-07 VITALS — BP 116/60 | HR 77

## 2022-09-07 DIAGNOSIS — M545 Low back pain, unspecified: Secondary | ICD-10-CM

## 2022-09-07 DIAGNOSIS — M6281 Muscle weakness (generalized): Secondary | ICD-10-CM

## 2022-09-07 DIAGNOSIS — R269 Unspecified abnormalities of gait and mobility: Secondary | ICD-10-CM

## 2022-09-07 DIAGNOSIS — M25552 Pain in left hip: Secondary | ICD-10-CM
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
## 2022-09-10 ENCOUNTER — Ambulatory Visit: Payer: Medicaid Other | Admitting: Physical Therapy

## 2022-09-14 ENCOUNTER — Ambulatory Visit: Payer: Medicaid Other | Admitting: Physical Therapy

## 2022-09-15 ENCOUNTER — Other Ambulatory Visit: Payer: Medicaid Other

## 2022-09-15 ENCOUNTER — Ambulatory Visit
Admission: RE | Admit: 2022-09-15 | Discharge: 2022-09-15 | Disposition: A | Discharge status: Home / Self Care | Payer: Medicaid Other | Source: Ambulatory Visit | Attending: Family Medicine | Admitting: Family Medicine

## 2022-09-15 DIAGNOSIS — I82452 Acute embolism and thrombosis of left peroneal vein: Secondary | ICD-10-CM

## 2022-09-21 ENCOUNTER — Ambulatory Visit: Payer: Medicaid Other | Admitting: Physical Therapy

## 2022-09-21 DIAGNOSIS — M6281 Muscle weakness (generalized): Secondary | ICD-10-CM

## 2022-09-21 DIAGNOSIS — R269 Unspecified abnormalities of gait and mobility: Secondary | ICD-10-CM

## 2022-09-21 DIAGNOSIS — S069X0A Unspecified intracranial injury without loss of consciousness, initial encounter: Secondary | ICD-10-CM

## 2022-09-21 DIAGNOSIS — M25552 Pain in left hip: Secondary | ICD-10-CM
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
## 2022-09-23 ENCOUNTER — Other Ambulatory Visit: Payer: Medicaid Other

## 2022-09-28 ENCOUNTER — Ambulatory Visit: Payer: Medicaid Other | Admitting: Physical Therapy

## 2022-09-28 ENCOUNTER — Encounter: Payer: Self-pay | Admitting: Physical Therapy

## 2022-09-28 DIAGNOSIS — M25552 Pain in left hip: Secondary | ICD-10-CM

## 2022-09-28 DIAGNOSIS — M6281 Muscle weakness (generalized): Secondary | ICD-10-CM

## 2022-09-28 DIAGNOSIS — R269 Unspecified abnormalities of gait and mobility: Secondary | ICD-10-CM | POA: Diagnosis not present
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
## 2022-10-05 ENCOUNTER — Ambulatory Visit: Payer: Medicaid Other | Attending: Physician Assistant | Admitting: Physical Therapy

## 2022-10-05 ENCOUNTER — Encounter: Payer: Self-pay | Admitting: Physical Therapy

## 2022-10-05 VITALS — BP 116/69 | HR 87

## 2022-10-05 DIAGNOSIS — R269 Unspecified abnormalities of gait and mobility: Secondary | ICD-10-CM | POA: Diagnosis present

## 2022-10-05 DIAGNOSIS — M6281 Muscle weakness (generalized): Secondary | ICD-10-CM | POA: Insufficient documentation

## 2022-10-05 DIAGNOSIS — M25552 Pain in left hip: Secondary | ICD-10-CM
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
## 2022-10-12 ENCOUNTER — Encounter: Payer: Self-pay | Admitting: Physical Therapy

## 2022-10-12 ENCOUNTER — Ambulatory Visit: Payer: Medicaid Other | Admitting: Physical Therapy

## 2022-10-12 NOTE — Therapy (Signed)
[facility] [address] [place], [place], [address] Phone: [phone]   Fax:  [phone]  Patient Details  Name: Karen Curry MRN: [id number] Date of Birth: 04-Sep-1995 Referring Provider:  No ref. provider found  Encounter Date: 10/12/2022  Pt unable to attend her final scheduled PT session due to being ill. Pt contacted via phone and is agreeable to d/c at this time and continue with her HEP. Pt will need a new referral to resume PT services in the future.  Peter Congo, PT, DPT, CSRS 10/12/2022, 10:29 AM  [facility] [address] [place], [place], [address] Phone: [phone]   Fax:  [phone]

## 2022-11-25 ENCOUNTER — Ambulatory Visit: Payer: Medicaid Other | Admitting: Family Medicine

## 2023-05-29 ENCOUNTER — Emergency Department (HOSPITAL_COMMUNITY): Payer: Medicaid Other

## 2023-05-29 ENCOUNTER — Emergency Department (HOSPITAL_COMMUNITY)
Admission: EM | Admit: 2023-05-29 | Discharge: 2023-05-30 | Disposition: A | Discharge status: Home / Self Care | Payer: Medicaid Other | Attending: Emergency Medicine | Admitting: Emergency Medicine

## 2023-05-29 ENCOUNTER — Encounter (HOSPITAL_COMMUNITY): Payer: Self-pay

## 2023-05-29 ENCOUNTER — Other Ambulatory Visit: Payer: Self-pay

## 2023-05-29 DIAGNOSIS — R059 Cough, unspecified: Secondary | ICD-10-CM | POA: Diagnosis present

## 2023-05-29 DIAGNOSIS — Z20822 Contact with and (suspected) exposure to covid-19: Secondary | ICD-10-CM | POA: Insufficient documentation

## 2023-05-29 DIAGNOSIS — J069 Acute upper respiratory infection, unspecified: Secondary | ICD-10-CM | POA: Insufficient documentation

## 2023-05-29 LAB — RESP PANEL BY RT-PCR (RSV, FLU A&B, COVID)  RVPGX2
Influenza A by PCR: NEGATIVE
Influenza B by PCR: NEGATIVE
Resp Syncytial Virus by PCR: NEGATIVE
SARS Coronavirus 2 by RT PCR: NEGATIVE

## 2023-05-29 MED ORDER — ALBUTEROL SULFATE HFA 108 (90 BASE) MCG/ACT IN AERS
2.0000 | INHALATION_SPRAY | Freq: Once | RESPIRATORY_TRACT | Status: AC
  Administered 2023-05-29: 2 via RESPIRATORY_TRACT
  Filled 2023-05-29: qty 6.7

## 2023-05-29 MED ORDER — ACETAMINOPHEN 325 MG PO TABS
650.0000 mg | ORAL_TABLET | Freq: Once | ORAL | Status: AC
  Administered 2023-05-29: 650 mg via ORAL
  Filled 2023-05-29: qty 2

## 2023-05-29 MED ORDER — ONDANSETRON 4 MG PO TBDP
4.0000 mg | ORAL_TABLET | Freq: Once | ORAL | Status: AC
  Administered 2023-05-29: 4 mg via ORAL
  Filled 2023-05-29: qty 1

## 2023-05-29 NOTE — ED Triage Notes (Signed)
Patient BIB EMS for cough, congestion, nausea, and headache x a couple days. Denies fevers.

## 2023-05-30 MED ORDER — HYDROCOD POLI-CHLORPHE POLI ER 10-8 MG/5ML PO SUER: 5.0000 mL | Freq: Every evening | ORAL | 0 refills | Status: AC | PRN

## 2023-05-30 NOTE — ED Provider Notes (Cosign Needed)
[place] EMERGENCY DEPARTMENT AT [facility] Provider Note   CSN: [id number] Arrival date & time: 05/29/23  1752     History  Chief Complaint  Patient presents with   Cough    Karen Curry is a 27 y.o. female.  Patient to ED with cough, headache, body aches for the past 3 days. No fevers but she reports chills. Cough is nonproductive. She reports nausea initially but none now, and no vomiting or diarrhea.   The history is provided by the patient. No language interpreter was used.  Cough      Home Medications Prior to Admission medications   Medication Sig Start Date End Date Taking? Authorizing Provider  chlorpheniramine-HYDROcodone (TUSSIONEX) 10-8 MG/5ML Take 5 mLs by mouth at bedtime as needed for cough. 05/30/23  Yes Amberleigh Gerken, Melvenia Beam, PA-C  apixaban (ELIQUIS) 5 MG TABS tablet Take 1 tablet (5 mg total) by mouth 2 (two) times daily. Patient not taking: Reported on 09/02/2022 06/14/22   Jones Bales, NP  ferrous sulfate 325 (65 FE) MG tablet Take 1 tablet (324 mg total) by mouth daily with breakfast. 06/29/22   Mliss Sax, MD      Allergies    Patient has no known allergies.    Review of Systems   Review of Systems  Respiratory:  Positive for cough.     Physical Exam Updated Vital Signs BP 133/87 (BP Location: Right Arm)   Pulse 78   Temp 98.7 F (37.1 C) (Oral)   Resp 18   Ht 5\' 3"  (1.6 m)   Wt 98.9 kg   SpO2 97%   BMI 38.62 kg/m  Physical Exam Vitals and nursing note reviewed.  Constitutional:      Appearance: She is well-developed.  HENT:     Head: Normocephalic.  Cardiovascular:     Rate and Rhythm: Normal rate and regular rhythm.     Heart sounds: No murmur heard. Pulmonary:     Effort: Pulmonary effort is normal.     Breath sounds: No wheezing, rhonchi or rales.  Abdominal:     Palpations: Abdomen is soft.     Tenderness: There is no abdominal tenderness. There is no guarding or rebound.  Musculoskeletal:         General: Normal range of motion.     Cervical back: Normal range of motion and neck supple.  Skin:    General: Skin is warm and dry.  Neurological:     General: No focal deficit present.     Mental Status: She is alert and oriented to person, place, and time.     ED Results / Procedures / Treatments   Labs (all labs ordered are listed, but only abnormal results are displayed) Labs Reviewed  RESP PANEL BY RT-PCR (RSV, FLU A&B, COVID)  [id number]    EKG None  Radiology DG Chest 2 View Result Date: 05/29/2023 CLINICAL DATA:  Cough and congestion. EXAM: CHEST - 2 VIEW COMPARISON:  05/01/2022 FINDINGS: The cardiomediastinal contours are normal. The lungs are clear. Pulmonary vasculature is normal. No consolidation, pleural effusion, or pneumothorax. No acute osseous abnormalities are seen. IMPRESSION: No active cardiopulmonary disease. Electronically Signed   By: Narda Rutherford M.D.   On: 05/29/2023 23:50    Procedures Procedures    Medications Ordered in ED Medications  acetaminophen (TYLENOL) tablet 650 mg (650 mg Oral Given 05/29/23 1802)  ondansetron (ZOFRAN-ODT) disintegrating tablet 4 mg (4 mg Oral Given 05/29/23 1802)  albuterol (VENTOLIN HFA) 108 (90 Base)  MCG/ACT inhaler 2 puff (2 puffs Inhalation Given 05/29/23 2350)    ED Course/ Medical Decision Making/ A&P Clinical Course as of 05/30/23 0659  Mon May 30, 2023  3086 27 yo healthy patient with URI symptoms for the past 3 days. Viral panel negative. CXR negative. VSS, afebrile. Appears well and nontoxic. Likely viral URI requiring supportive care.  [SU]    Clinical Course User Index [SU] Elpidio Anis, PA-C                                 Medical Decision Making Amount and/or Complexity of Data Reviewed Radiology: ordered.  Risk OTC drugs. Prescription drug management.           Final Clinical Impression(s) / ED Diagnoses Final diagnoses:  Viral URI with cough    Rx / DC Orders ED Discharge  Orders          Ordered    chlorpheniramine-HYDROcodone (TUSSIONEX) 10-8 MG/5ML  At bedtime PRN        05/30/23 0010              Elpidio Anis, PA-C 05/30/23 [phone]

## 2023-05-30 NOTE — Discharge Instructions (Addendum)
Use Tussionex cough medication at night. Use the inhaler every 3-4 hours if this helps with cough during the day. Tylenol and/or ibuprofen for aches and if any fever develops.

## 2023-06-02 ENCOUNTER — Telehealth: Payer: Self-pay

## 2023-06-02 NOTE — Transitions of Care (Post Inpatient/ED Visit) (Signed)
   06/02/2023  Name: Karen Curry MRN: [id number] DOB: [birth year]  Today's TOC FU Call Status: Today's TOC FU Call Status:: Successful TOC FU Call Completed TOC FU Call Complete Date: 06/02/23 Patient's Name and Date of Birth confirmed.  Transition Care Management Follow-up Telephone Call Date of Discharge: 05/30/23 Discharge Facility: [facility]) Type of Discharge: Emergency Department How have you been since you were released from the hospital?: Better Any questions or concerns?: No  Items Reviewed: Did you receive and understand the discharge instructions provided?: Yes Any new allergies since your discharge?: No Dietary orders reviewed?: NA Do you have support at home?: Yes  Medications Reviewed Today: Medications Reviewed Today   Medications were not reviewed in this encounter     Home Care and Equipment/Supplies: Were Home Health Services Ordered?: NA Any new equipment or medical supplies ordered?: NA  Functional Questionnaire: Do you need assistance with bathing/showering or dressing?: No Do you need assistance with meal preparation?: No Do you need assistance with eating?: No Do you have difficulty maintaining continence: No Do you need assistance with getting out of bed/getting out of a chair/moving?: No Do you have difficulty managing or taking your medications?: No  Follow up appointments reviewed: PCP Follow-up appointment confirmed?: NA Specialist Hospital Follow-up appointment confirmed?: NA Do you need transportation to your follow-up appointment?: No Do you understand care options if your condition(s) worsen?: Yes-patient verbalized understanding    SIGNATURE Dedra Sorenson, BSN, RN

## 2023-07-21 IMAGING — CR DG CHEST 1V
1 series · 1 of 1 positions shown · non-contrast
Comparison: December 16, 2019

CLINICAL DATA: Positive TB test

EXAM:
CHEST  1 VIEW

[w chest pa]
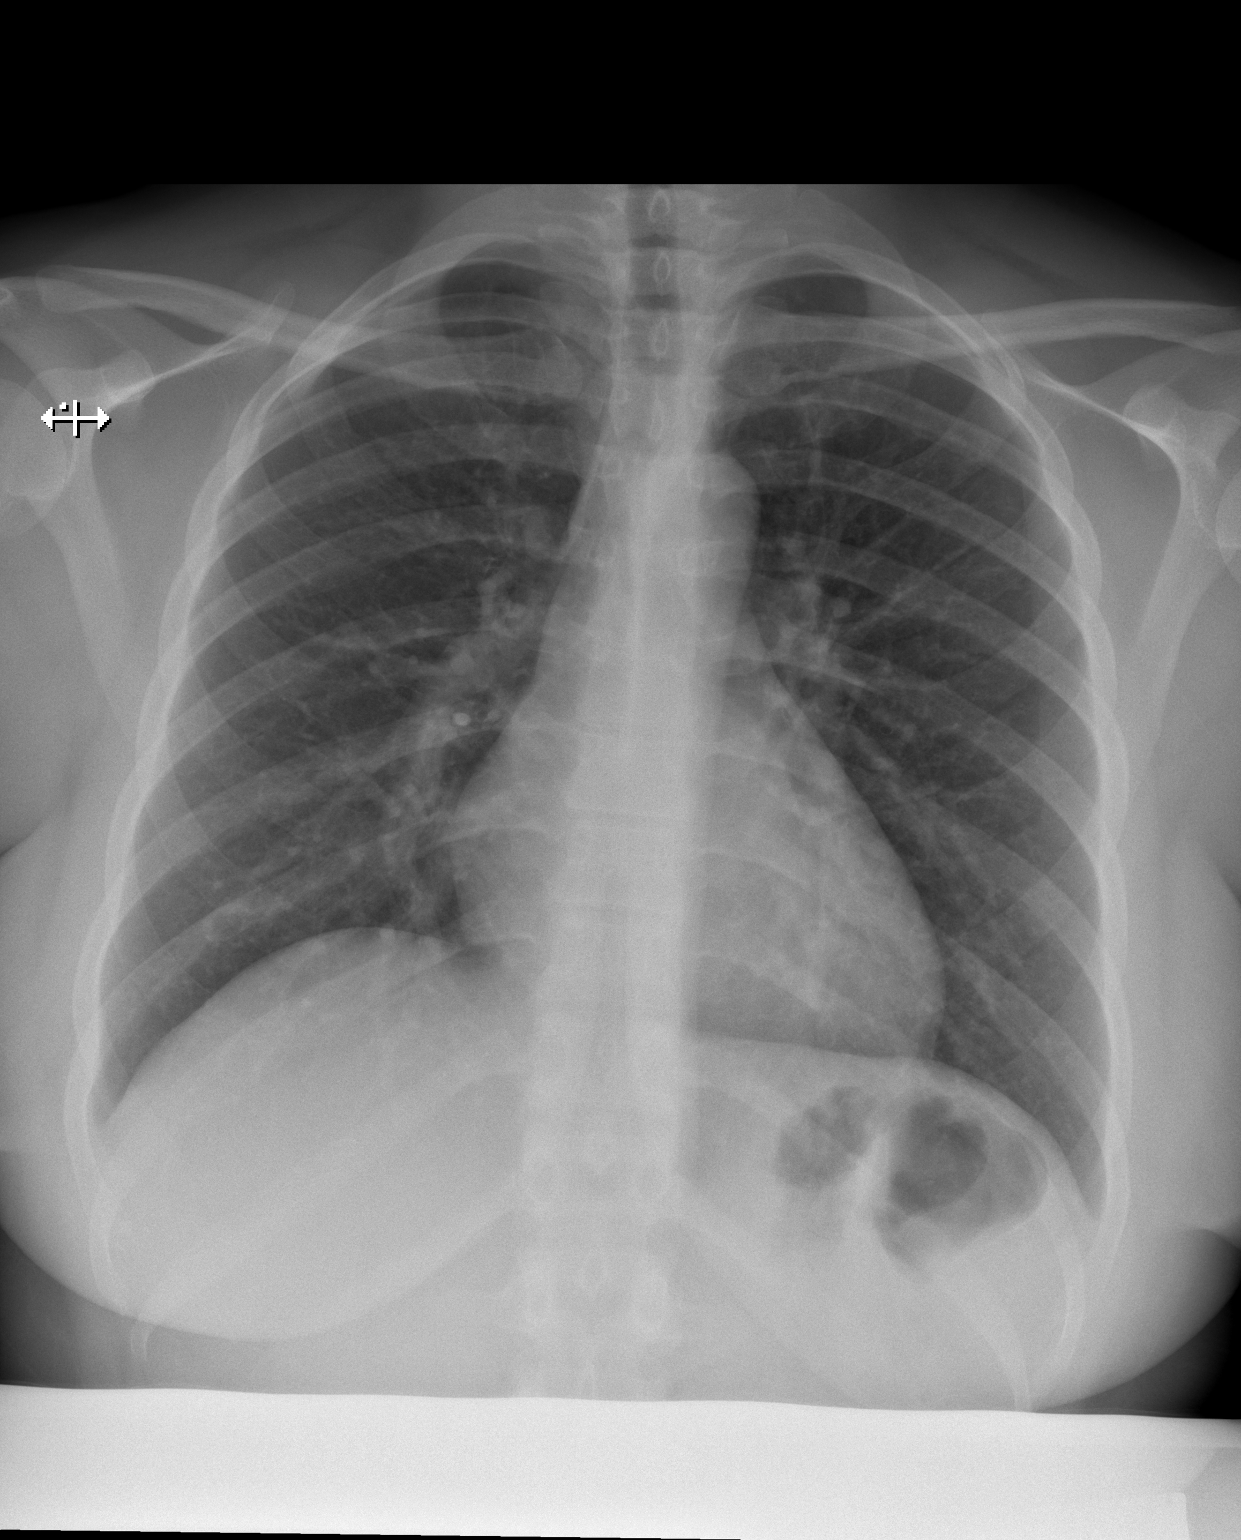

[1 of 1 positions shown; findings below may reference images not displayed]

FINDINGS: The heart size and mediastinal contours are within normal limits.
Both lungs are clear. The visualized skeletal structures are
unremarkable.
IMPRESSION: No active disease.

## 2023-07-28 IMAGING — US US OB < 14 WEEKS - US OB TV
1 series · 15 of 28 positions shown · non-contrast
Comparison: None Available.

CLINICAL DATA: Abdominal pain for the past 2 weeks. Estimated
gestational age of 7 weeks, 3 days by LMP.

EXAM:
OBSTETRIC <14 WK US AND TRANSVAGINAL OB US
TECHNIQUE: Both transabdominal and transvaginal ultrasound examinations were
performed for complete evaluation of the gestation as well as the
maternal uterus, adnexal regions, and pelvic cul-de-sac.
Transvaginal technique was performed to assess early pregnancy.

[Series 1: us ob < 14 weeks - us ob tv · 15 of 54 slices shown]
[im 1/54]
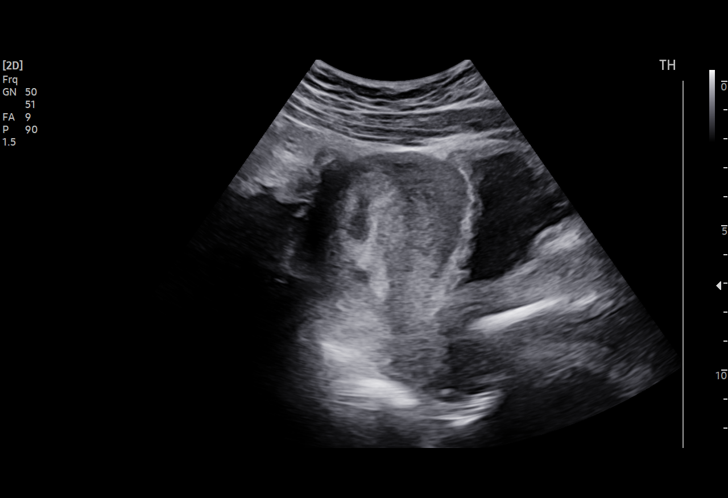
[im 4/54]
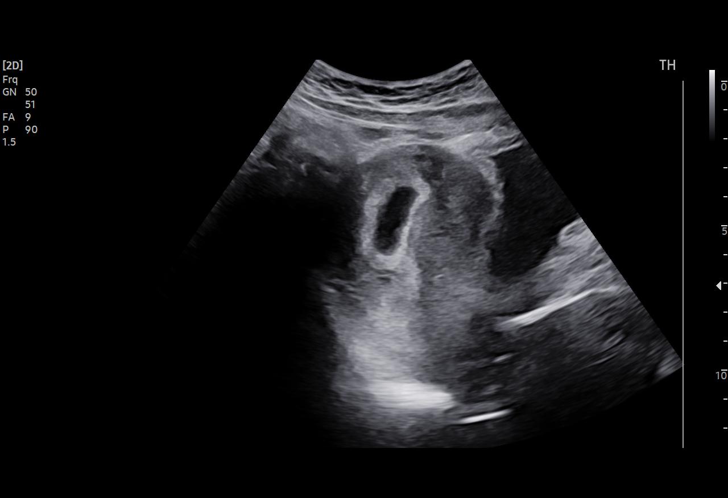
[im 8/54]
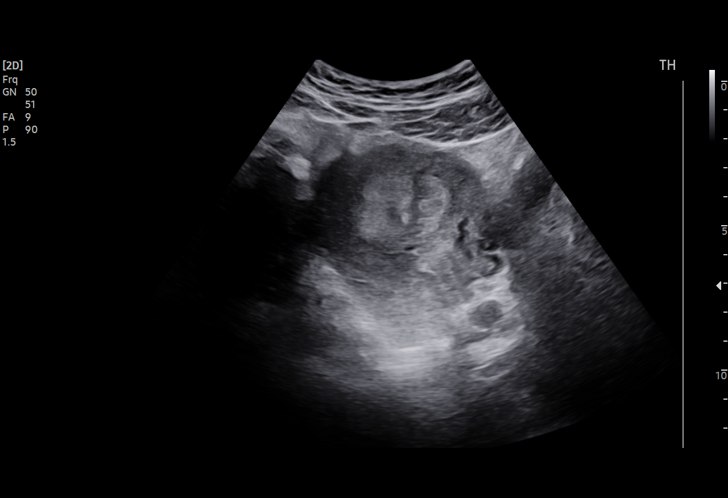
[im 12/54]
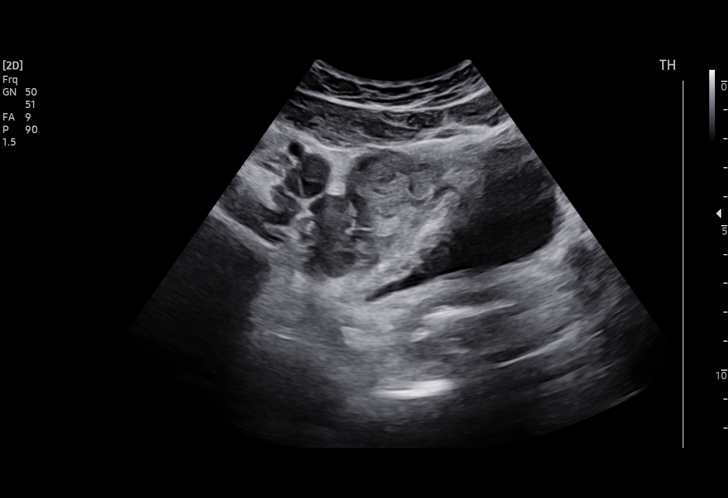
[im 16/54]
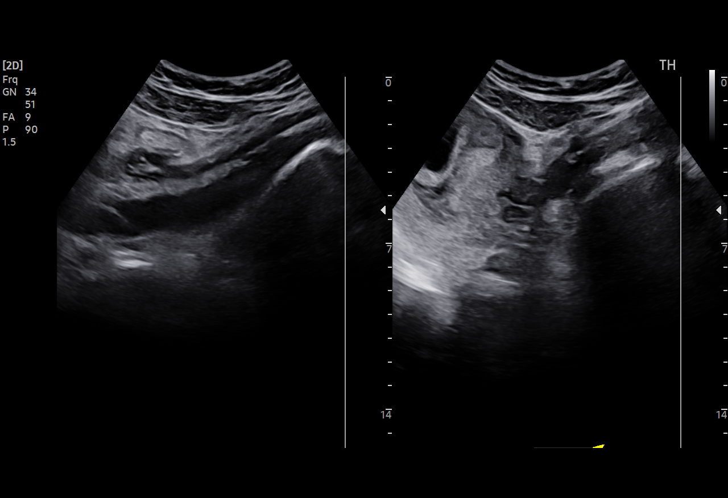
[im 20/54]
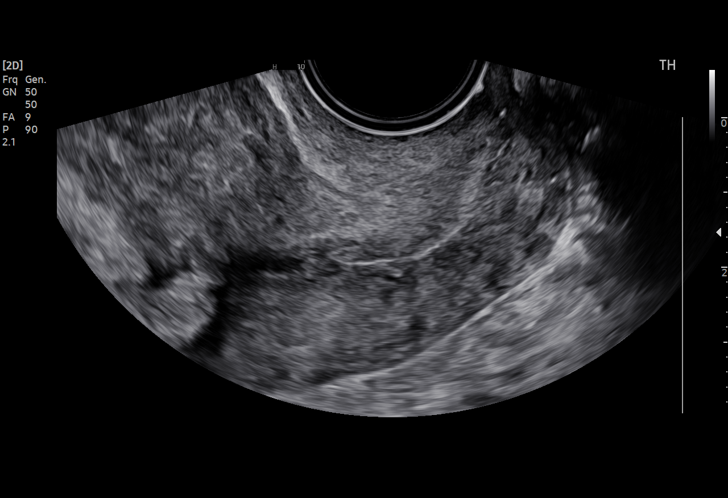
[im 24/54]
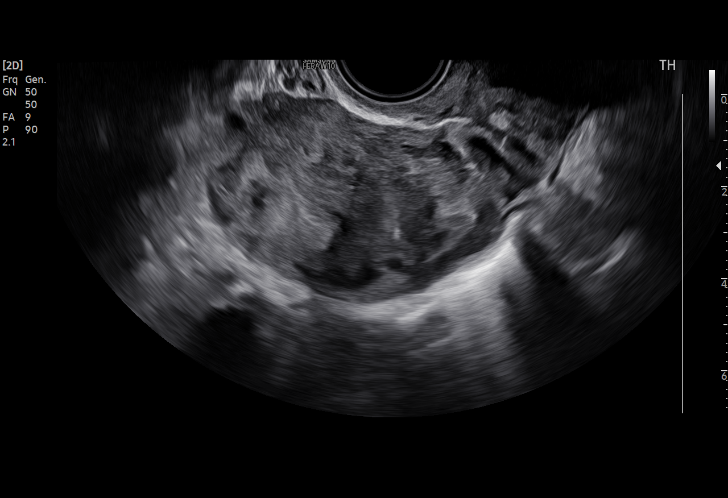
[im 28/54]
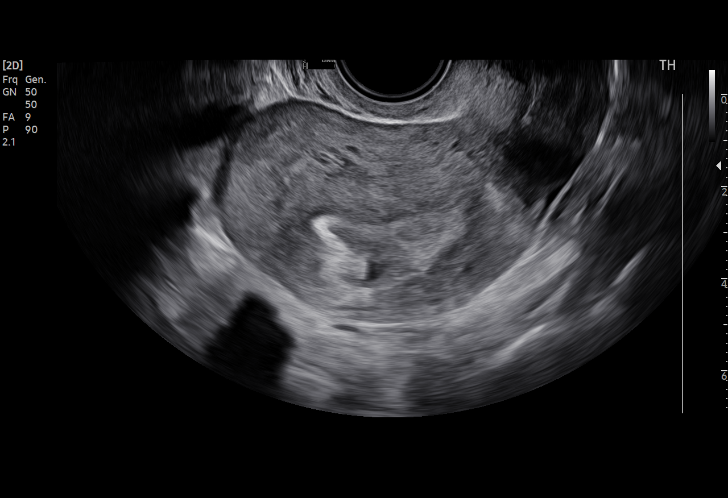
[im 30/54]
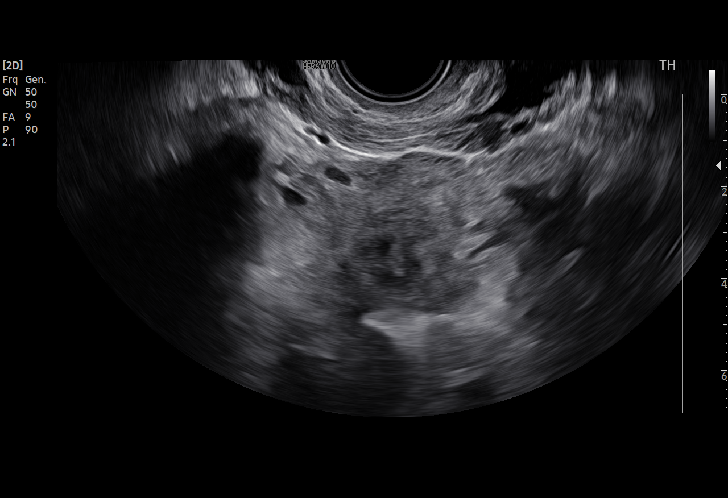
[im 34/54]
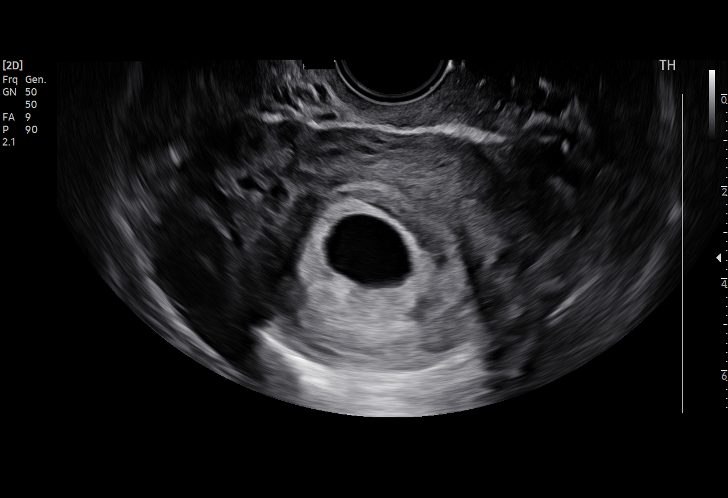
[im 38/54]
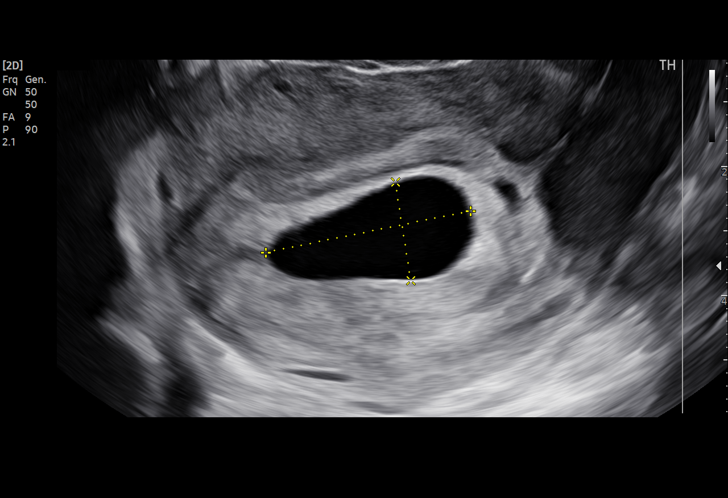
[im 42/54]
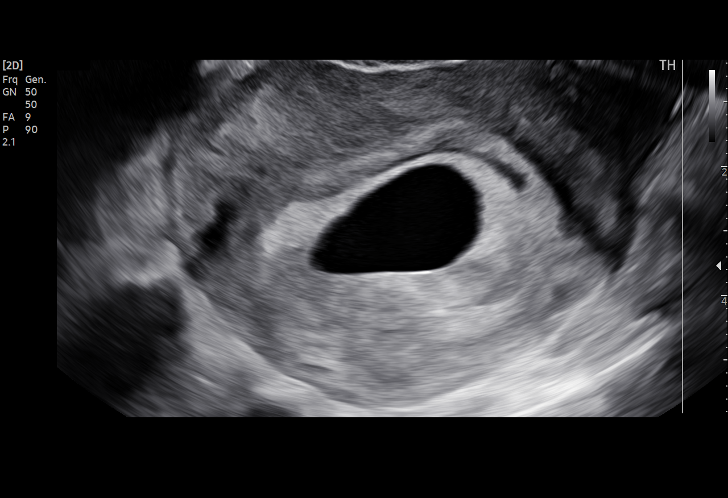
[im 46/54]
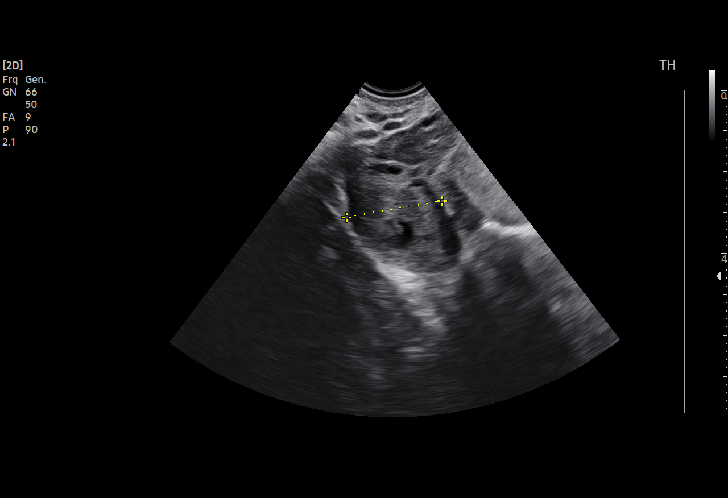
[im 50/54]
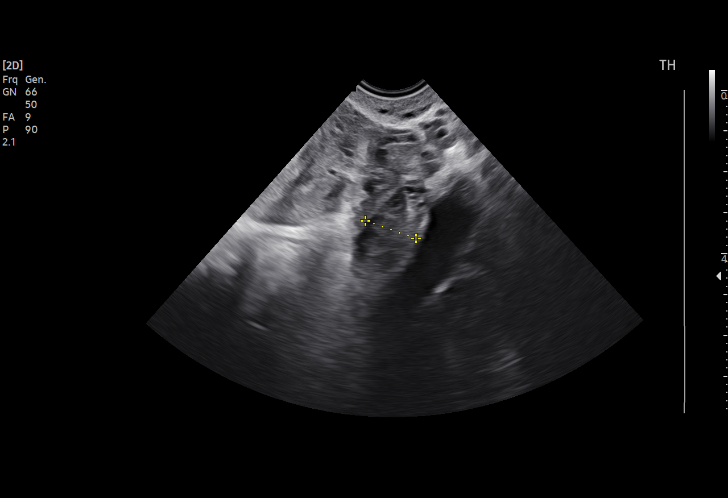
[im 54/54]
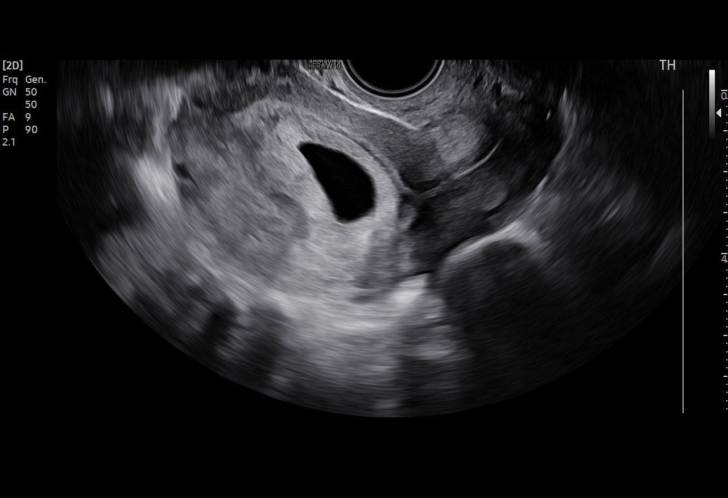

[15 of 28 positions shown; findings below may reference images not displayed]

FINDINGS: Intrauterine gestational sac: Single.

Yolk sac:  Not Visualized.

Embryo:  Not Visualized.

MSD: 23.6 mm   7 w   3 d

Subchorionic hemorrhage:  Small subchorionic hemorrhage.

Maternal uterus/adnexae: Unremarkable.
IMPRESSION: Findings are suspicious but not yet definitive for failed pregnancy.
Recommend follow-up US in 10-14 days for definitive diagnosis. This
recommendation follows SRU consensus guidelines: Diagnostic Criteria
for Nonviable Pregnancy Early in the First Trimester. N Engl J Med

## 2023-08-03 ENCOUNTER — Encounter: Payer: Self-pay | Admitting: Family Medicine

## 2023-08-03 ENCOUNTER — Encounter: Payer: Self-pay | Admitting: *Deleted

## 2023-08-03 ENCOUNTER — Ambulatory Visit: Payer: Medicaid Other | Admitting: Family Medicine

## 2023-08-03 VITALS — BP 115/76 | HR 72 | Ht 63.0 in | Wt 247.8 lb

## 2023-08-03 DIAGNOSIS — Z3491 Encounter for supervision of normal pregnancy, unspecified, first trimester: Secondary | ICD-10-CM

## 2023-08-03 DIAGNOSIS — Z3201 Encounter for pregnancy test, result positive: Secondary | ICD-10-CM

## 2023-08-03 DIAGNOSIS — Z3A01 Less than 8 weeks gestation of pregnancy: Secondary | ICD-10-CM

## 2023-08-03 LAB — POCT PREGNANCY, URINE: Preg Test, Ur: POSITIVE — AB

## 2023-08-03 NOTE — Progress Notes (Signed)
  Amenorrhea with positive UPT PROBLEM  VISIT ENCOUNTER NOTE  Subjective:   Karen Curry is a 28 y.o. G18P2002  female here for positive pregnancy test.  She has not had serial bhcg. She has not an [place].   Lab results Results for orders placed or performed in visit on 08/03/23 (from the past 24 hours)  Pregnancy, urine POC     Status: Abnormal   Collection Time: 08/03/23  9:15 AM  Result Value Ref Range   Preg Test, Ur POSITIVE (A) NEGATIVE    Denies abnormal vaginal bleeding, discharge, pelvic pain, problems with intercourse or other gynecologic concerns.    Gynecologic History Patient's last menstrual period was 06/15/2023 (approximate).  Health Maintenance Due  Topic Date Due   Hepatitis C Screening  Never done   Cervical Cancer Screening (Pap smear)  10/03/2021   COVID-19 Vaccine (1 - 2024-25 season) Never done    The following portions of the patient's history were reviewed and updated as appropriate: allergies, current medications, past family history, past medical history, past social history, past surgical history and problem list.  Review of Systems Pertinent items are noted in HPI.   Objective:  BP 115/76   Pulse 72   Ht 5\' 3"  (1.6 m)   Wt 247 lb 12.8 oz (112.4 kg)   LMP 06/15/2023 (Approximate)   BMI 43.90 kg/m  Gen: well appearing, NAD HEENT: no scleral icterus CV: RR Lung: Normal WOB Ext: warm well perfused   Assessment and Plan:  Positive pregnancy test  Other orders -     Pregnancy, urine POC   Patient previously established with [facility].  Scheduled for appointment on the 20th.  Will follow-up with them.  No other concerns.  Discussed MAU precautions.   Please refer to After Visit Summary for other counseling recommendations.   Return for Give pregnancy verification letter. EDD 03/21/24.  Will have prenatal care @ [facility].  Celedonio Savage, MD Attending Physician Faculty Practice- Center for [facility]

## 2023-08-03 NOTE — Progress Notes (Signed)
 Pt presents for urine pregnancy test which is positive. She states she will be receiving prenatal care @ [facility] and has scheduled appointment on 3/20. Pt came to this office today because her boyfriend did not believe her home UPT test result and she did not want to wait until 3/20 for confirmation.  Pt reports approximate LMP 06/15/23 which yields EDD 03/21/24, now [redacted]w[redacted]d. She reports occasional mild abdominal cramps and denies vaginal bleeding. Pt was advised to go to MAU if she develops heavy vaginal bleeding or increased abdominal pain.  She voiced understanding.

## 2023-09-08 ENCOUNTER — Other Ambulatory Visit: Payer: Self-pay
